# Patient Record
Sex: Male | Born: 1950 | Race: Black or African American | Hispanic: No | Marital: Single | Smoking: Former smoker
Health system: Southern US, Community
[De-identification: ages and names within clinical notes are randomized; demographics above are authoritative.]

## PROBLEM LIST (undated history)

## (undated) DIAGNOSIS — Z87898 Personal history of other specified conditions: Secondary | ICD-10-CM

## (undated) DIAGNOSIS — R570 Cardiogenic shock: Secondary | ICD-10-CM

## (undated) DIAGNOSIS — J90 Pleural effusion, not elsewhere classified: Secondary | ICD-10-CM

## (undated) HISTORY — DX: Personal history of other specified conditions: Z87.898

---
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
## 1998-04-02 ENCOUNTER — Other Ambulatory Visit: Admission: RE | Admit: 1998-04-02 | Discharge: 1998-04-02 | Payer: Self-pay | Admitting: Orthopedic Surgery

## 2003-05-01 ENCOUNTER — Encounter: Admission: RE | Admit: 2003-05-01 | Discharge: 2003-07-30 | Payer: Self-pay

## 2003-08-14 ENCOUNTER — Ambulatory Visit (HOSPITAL_COMMUNITY): Admission: RE | Admit: 2003-08-14 | Discharge: 2003-08-14 | Payer: Self-pay | Admitting: Family Medicine

## 2004-07-27 ENCOUNTER — Encounter (INDEPENDENT_AMBULATORY_CARE_PROVIDER_SITE_OTHER): Payer: Self-pay | Admitting: *Deleted

## 2004-07-27 ENCOUNTER — Ambulatory Visit (HOSPITAL_COMMUNITY): Admission: RE | Admit: 2004-07-27 | Discharge: 2004-07-27 | Payer: Self-pay | Admitting: Gastroenterology

## 2004-10-15 ENCOUNTER — Ambulatory Visit: Payer: Self-pay | Admitting: Internal Medicine

## 2008-03-25 ENCOUNTER — Inpatient Hospital Stay (HOSPITAL_COMMUNITY): Admission: EM | Admit: 2008-03-25 | Discharge: 2008-04-01 | Payer: Self-pay | Admitting: Emergency Medicine

## 2008-04-03 ENCOUNTER — Emergency Department (HOSPITAL_COMMUNITY): Admission: EM | Admit: 2008-04-03 | Discharge: 2008-04-04 | Payer: Self-pay | Admitting: Emergency Medicine

## 2010-02-28 ENCOUNTER — Encounter: Payer: Self-pay | Admitting: Gastroenterology

## 2010-05-25 LAB — GLUCOSE, CAPILLARY
Glucose-Capillary: 102 mg/dL — ABNORMAL HIGH (ref 70–99)
Glucose-Capillary: 114 mg/dL — ABNORMAL HIGH (ref 70–99)
Glucose-Capillary: 115 mg/dL — ABNORMAL HIGH (ref 70–99)
Glucose-Capillary: 128 mg/dL — ABNORMAL HIGH (ref 70–99)
Glucose-Capillary: 134 mg/dL — ABNORMAL HIGH (ref 70–99)
Glucose-Capillary: 139 mg/dL — ABNORMAL HIGH (ref 70–99)
Glucose-Capillary: 144 mg/dL — ABNORMAL HIGH (ref 70–99)
Glucose-Capillary: 148 mg/dL — ABNORMAL HIGH (ref 70–99)
Glucose-Capillary: 164 mg/dL — ABNORMAL HIGH (ref 70–99)
Glucose-Capillary: 172 mg/dL — ABNORMAL HIGH (ref 70–99)
Glucose-Capillary: 174 mg/dL — ABNORMAL HIGH (ref 70–99)
Glucose-Capillary: 190 mg/dL — ABNORMAL HIGH (ref 70–99)
Glucose-Capillary: 255 mg/dL — ABNORMAL HIGH (ref 70–99)
Glucose-Capillary: 257 mg/dL — ABNORMAL HIGH (ref 70–99)
Glucose-Capillary: 264 mg/dL — ABNORMAL HIGH (ref 70–99)
Glucose-Capillary: 266 mg/dL — ABNORMAL HIGH (ref 70–99)
Glucose-Capillary: 283 mg/dL — ABNORMAL HIGH (ref 70–99)
Glucose-Capillary: 75 mg/dL (ref 70–99)
Glucose-Capillary: 81 mg/dL (ref 70–99)

## 2010-05-25 LAB — COMPREHENSIVE METABOLIC PANEL
ALT: 102 U/L — ABNORMAL HIGH (ref 0–53)
Albumin: 2.3 g/dL — ABNORMAL LOW (ref 3.5–5.2)
Albumin: 2.6 g/dL — ABNORMAL LOW (ref 3.5–5.2)
Albumin: 3.1 g/dL — ABNORMAL LOW (ref 3.5–5.2)
Alkaline Phosphatase: 55 U/L (ref 39–117)
Alkaline Phosphatase: 60 U/L (ref 39–117)
Alkaline Phosphatase: 65 U/L (ref 39–117)
Alkaline Phosphatase: 93 U/L (ref 39–117)
BUN: 5 mg/dL — ABNORMAL LOW (ref 6–23)
BUN: 7 mg/dL (ref 6–23)
BUN: 9 mg/dL (ref 6–23)
CO2: 22 mEq/L (ref 19–32)
Calcium: 8.5 mg/dL (ref 8.4–10.5)
Calcium: 8.9 mg/dL (ref 8.4–10.5)
Creatinine, Ser: 1.11 mg/dL (ref 0.4–1.5)
Creatinine, Ser: 1.11 mg/dL (ref 0.4–1.5)
GFR calc non Af Amer: >60 mL/min (ref >60)
GFR calc non Af Amer: >60 mL/min (ref >60)
Glucose, Bld: 161 mg/dL — ABNORMAL HIGH (ref 70–99)
Glucose, Bld: 162 mg/dL — ABNORMAL HIGH (ref 70–99)
Glucose, Bld: 172 mg/dL — ABNORMAL HIGH (ref 70–99)
Potassium: 3.5 mEq/L (ref 3.5–5.1)
Potassium: 3.6 mEq/L (ref 3.5–5.1)
Potassium: 3.7 mEq/L (ref 3.5–5.1)
Sodium: 129 mEq/L — ABNORMAL LOW (ref 135–145)
Total Bilirubin: 6.6 mg/dL — ABNORMAL HIGH (ref 0.3–1.2)
Total Bilirubin: 8.3 mg/dL — ABNORMAL HIGH (ref 0.3–1.2)
Total Protein: 5.4 g/dL — ABNORMAL LOW (ref 6.0–8.3)
Total Protein: 5.7 g/dL — ABNORMAL LOW (ref 6.0–8.3)

## 2010-05-25 LAB — CBC
HCT: 31 % — ABNORMAL LOW (ref 39.0–52.0)
HCT: 31.2 % — ABNORMAL LOW (ref 39.0–52.0)
HCT: 32.5 % — ABNORMAL LOW (ref 39.0–52.0)
HCT: 34.9 % — ABNORMAL LOW (ref 39.0–52.0)
HCT: 35.5 % — ABNORMAL LOW (ref 39.0–52.0)
Hemoglobin: 10.7 g/dL — ABNORMAL LOW (ref 13.0–17.0)
Hemoglobin: 11 g/dL — ABNORMAL LOW (ref 13.0–17.0)
Hemoglobin: 11.1 g/dL — ABNORMAL LOW (ref 13.0–17.0)
Hemoglobin: 12.4 g/dL — ABNORMAL LOW (ref 13.0–17.0)
Hemoglobin: 12.9 g/dL — ABNORMAL LOW (ref 13.0–17.0)
MCHC: 34.7 g/dL (ref 30.0–36.0)
MCHC: 35.5 g/dL (ref 30.0–36.0)
MCHC: 35.5 g/dL (ref 30.0–36.0)
MCHC: 35.6 g/dL (ref 30.0–36.0)
MCV: 94.5 fL (ref 78.0–100.0)
MCV: 96.2 fL (ref 78.0–100.0)
MCV: 96.8 fL (ref 78.0–100.0)
Platelets: 102 10*3/uL — ABNORMAL LOW (ref 150–400)
Platelets: 107 10*3/uL — ABNORMAL LOW (ref 150–400)
Platelets: 127 10*3/uL — ABNORMAL LOW (ref 150–400)
RBC: 3.32 MIL/uL — ABNORMAL LOW (ref 4.22–5.81)
RBC: 3.76 MIL/uL — ABNORMAL LOW (ref 4.22–5.81)
RDW: 13.3 % (ref 11.5–15.5)
RDW: 13.4 % (ref 11.5–15.5)
RDW: 13.6 % (ref 11.5–15.5)
RDW: 13.7 % (ref 11.5–15.5)
WBC: 4.6 10*3/uL (ref 4.0–10.5)

## 2010-05-25 LAB — ETHANOL: Alcohol, Ethyl (B): <5 mg/dL (ref 0–10)

## 2010-05-25 LAB — IRON AND TIBC
Iron: 227 ug/dL — ABNORMAL HIGH (ref 42–135)
UIBC: <55 ug/dL

## 2010-05-25 LAB — RETICULOCYTES
RBC.: 3.72 MIL/uL — ABNORMAL LOW (ref 4.22–5.81)
Retic Count, Absolute: 63.2 10*3/uL (ref 19.0–186.0)

## 2010-05-25 LAB — URINALYSIS, ROUTINE W REFLEX MICROSCOPIC
Glucose, UA: NEGATIVE mg/dL
Hgb urine dipstick: NEGATIVE
Ketones, ur: 40 mg/dL — AB
Protein, ur: NEGATIVE mg/dL
Protein, ur: NEGATIVE mg/dL
Urobilinogen, UA: 1 mg/dL (ref 0.0–1.0)
Urobilinogen, UA: 2 mg/dL — ABNORMAL HIGH (ref 0.0–1.0)

## 2010-05-25 LAB — RAPID URINE DRUG SCREEN, HOSP PERFORMED
Amphetamines: NOT DETECTED
Barbiturates: NOT DETECTED
Cocaine: NOT DETECTED
Opiates: NOT DETECTED
Tetrahydrocannabinol: NOT DETECTED

## 2010-05-25 LAB — LIPASE, BLOOD: Lipase: 83 U/L — ABNORMAL HIGH (ref 11–59)

## 2010-05-25 LAB — DIFFERENTIAL
Basophils Absolute: 0.1 10*3/uL (ref 0.0–0.1)
Basophils Relative: 0 % (ref 0–1)
Eosinophils Absolute: 0 10*3/uL (ref 0.0–0.7)
Lymphocytes Relative: 17 % (ref 12–46)
Lymphs Abs: 0.8 10*3/uL (ref 0.7–4.0)
Monocytes Relative: 8 % (ref 3–12)
Neutro Abs: 3.5 10*3/uL (ref 1.7–7.7)
Neutrophils Relative %: 74 % (ref 43–77)
Neutrophils Relative %: 77 % (ref 43–77)

## 2010-05-25 LAB — BASIC METABOLIC PANEL
Calcium: 8.5 mg/dL (ref 8.4–10.5)
Calcium: 9 mg/dL (ref 8.4–10.5)
Chloride: 73 mEq/L — CL (ref 96–112)
GFR calc Af Amer: >60 mL/min (ref >60)
GFR calc Af Amer: >60 mL/min (ref >60)
GFR calc non Af Amer: >60 mL/min (ref >60)
Glucose, Bld: 259 mg/dL — ABNORMAL HIGH (ref 70–99)
Potassium: 3.7 mEq/L (ref 3.5–5.1)
Potassium: 4.4 mEq/L (ref 3.5–5.1)
Sodium: 107 mEq/L — CL (ref 135–145)
Sodium: 136 mEq/L (ref 135–145)

## 2010-05-25 LAB — PHOSPHORUS
Phosphorus: 1.6 mg/dL — ABNORMAL LOW (ref 2.3–4.6)
Phosphorus: <1 mg/dL — CL (ref 2.3–4.6)

## 2010-05-25 LAB — HEPATIC FUNCTION PANEL
Albumin: 3.2 g/dL — ABNORMAL LOW (ref 3.5–5.2)
Bilirubin, Direct: 4.2 mg/dL — ABNORMAL HIGH (ref 0.0–0.3)
Total Protein: 7.5 g/dL (ref 6.0–8.3)

## 2010-05-25 LAB — PROTIME-INR
INR: 1.2 (ref 0.00–1.49)
Prothrombin Time: 15.2 seconds (ref 11.6–15.2)

## 2010-05-25 LAB — HEMOCCULT GUIAC POC 1CARD (OFFICE): Fecal Occult Bld: NEGATIVE

## 2010-05-25 LAB — AMMONIA: Ammonia: 24 umol/L (ref 11–35)

## 2010-05-25 LAB — FERRITIN: Ferritin: 3838 ng/mL — ABNORMAL HIGH (ref 22–322)

## 2010-05-25 LAB — TRANSFERRIN: Transferrin: 131 mg/dL — ABNORMAL LOW (ref 212–360)

## 2010-05-25 LAB — URINE MICROSCOPIC-ADD ON

## 2010-05-25 LAB — VITAMIN B12: Vitamin B-12: 1743 pg/mL — ABNORMAL HIGH (ref 211–911)

## 2010-05-25 LAB — MAGNESIUM: Magnesium: 1.9 mg/dL (ref 1.5–2.5)

## 2010-06-22 NOTE — Discharge Summary (Signed)
NAME:  Ryan [name], SEELBACH NO.:  [id number]   MEDICAL RECORD NO.:  [id number]          PATIENT TYPE:  INP   LOCATION:  [place]                         FACILITY:  [facility]   PHYSICIAN:  Barbette Merino, M.D.      DATE OF BIRTH:  06-03-1950   DATE OF ADMISSION:  03/25/2008  DATE OF DISCHARGE:  03/30/2008                               DISCHARGE SUMMARY   PRIMARY CARE PHYSICIAN:  Petronila:  1. Alcohol intoxication with withdrawal.  2. Hypertension.  3. Diabetes.  4. Alcoholic liver disease.  5. Transient pancytopenia.  6. Increased ferritin and iron.  7. Hyponatremia, now resolved.   DISCHARGE MEDICATIONS:  1. Thiamine 100 mg daily.  2. Folic acid 1 mg daily.  3. Ativan 1 mg p.o. q.4 hours p.r.n. anxiety/agitation.  4. Glipizide 10 mg daily.  5. Cozaar 100 mg daily.  6. Hydrochlorothiazide 25 mg daily.  7. Lopressor 25 mg p.o. b.i.d.   DISPOSITION:  The patient is being discharged to continue his follow up  at the [facility].  He is currently stable.  He is going to be staying  with family members.   PROCEDURES PERFORMED:  None.   CONSULTATIONS:  None.   BRIEF HISTORY AND PHYSICAL:  Please refer to dictated history and  physical on admission by Dr. Karlyn Agee.  In short, however, the  patient is a 60 year old [place] veteran with a history of diabetes and  hypertension as well as alcoholism.  The patient came in complaining of  alcohol binging.  He stopped taking alcohol about 2 days prior to coming  in, asking for detoxification.  The patient was agitated at the time but  not distressed.  His labs showed a sodium of 107, potassium 4.4, and his  glucose of 259.  His creatinine was 1.32.  He was also alkalotic.  He  was subsequently admitted for further management.   HOSPITAL COURSE:  1. Alcohol withdrawal.  The patient was placed on the CIWA protocol      with Ativan.  He continued to respond gradually up until today.  He      is  about to finish his protocol in the next 2 days and seems to be      stable at this point, able to walk around.  2. Hyponatremia.  This was severe as indicated.  But with saline and      regular conservative measures, his sodium has since corrected.  3. Hypertension.  This is also controlled on the medications including      his home meds and hydralazine as needed.  4. Diabetes.  The patient was placed on Lantus and sliding scale      insulin while in the hospital, but we put him back on his glipizide      but at a higher dose.  5. Pancytopenia.  This most likely secondary to liver disease.  This      is also improving.  6. Alcoholic hepatitis with cirrhosis.  Again this is all alcohol      related.  The patient is counseled extensively.  He  will continue      with outpatient counseling as necessary.      Barbette Merino, M.D.  Electronically Signed     LG/MEDQ  D:  03/30/2008  T:  03/30/2008  Job:  [id number]

## 2010-06-22 NOTE — H&P (Signed)
NAME:  Ryan [name], ORSER NO.:  [id number]   MEDICAL RECORD NO.:  [id number]          PATIENT TYPE:  EMS   LOCATION:  ED                           FACILITY:  [facility]   PHYSICIAN:  Karlyn Agee, M.D. DATE OF BIRTH:  April 17, 1950   DATE OF ADMISSION:  03/25/2008  DATE OF DISCHARGE:                              HISTORY & PHYSICAL   PRIMARY CARE PHYSICIAN:  [facility].   CHIEF COMPLAINT:  Alcohol binging.   HISTORY OF PRESENT ILLNESS:  This is a 60 year old [place] Veteran with  history of diabetes, hypertension and alcoholism; who usually drinks  about one pint of gin every day, and has been drinking consistently  until about 2 days ago when he decided to stop.  He Presented to the  emergency room today requesting to be admitted today as a health alcohol  detox.  The patient was evaluated by the emergency room physician and  was noted to have abnormal serum by chemistry, and the hospitalist  service called to assist with management.   The patient vomiting twice per day for the past 3 days.  Denies any  diarrhea.  Denies any abdominal pain, but has been having sore throats  since the vomiting.  Also has been having hiccups, but denies chest  pain.  Denies abdominal pain.  Denies fever, cough or cold.  He denies  headache or shortness of breath.   The patient reports that he can go without drinking alcohol for extended  period without having any symptoms of withdrawal.   PAST MEDICAL HISTORY:  1. Diabetes.  2. Hypertension.  3. History of chronic active hepatitis by biopsy in 2006.   MEDICATIONS:  1. Cozaar 100 mg daily.  2. Glipizide 2.5 mg twice daily.  3. Metoprolol tartrate 25 mg daily.  4. Hydrochlorothiazide 25 mg daily.  5. Metformin 850 mg twice daily.   ALLERGIES:  ACE INHIBITOR causes cough.   SOCIAL HISTORY:  Denies tobacco or illicit drug use, but drinks one pint  of gin every day.  He is a veteran of the [place] War.   FAMILY HISTORY:   Denies any chronic medical problems in the family.   REVIEW OF SYSTEMS:  Other than noted above, the patient denies any  problems on a 10-point review of systems.   PHYSICAL EXAMINATION:  A small-framed middle-aged African American  gentleman lying on the stretcher; does not appear in any way distressed.  VITAL SIGNS: Temperature 97.7, pulse 93, respirations 20, blood pressure  160/107, saturating at 99% on room air.  His pupils are round and equal.  Mucous membranes pink.  Anicteric.  He  is mildly dehydrated.  No cervical lymphadenopathy or thyromegaly.  No jugular venous  distention or bruit.  CHEST:  Clear to auscultation bilaterally.  CARDIOVASCULAR SYSTEM:  Regular rhythm.  He has a 1/6 systolic murmur in  the left upper sternal border.  ABDOMEN:  Slightly distended, but soft and nontender.  There are no  masses and there is no flank dullness.  EXTREMITIES:  Without edema.  He has 2+ pulses bilaterally.  CENTRAL NERVOUS SYSTEM: Cranial nerves II-XII are grossly  intact and he  has no focal neurologic deficits.   LABS:  CBC:  White count 4.6, hemoglobin 12.7, MCV 94.5, platelets 129.  He has a normal differential on his white count.  Serum Chemistries:  Sodium 107, potassium 4.4, chloride 73, CO2 17, glucose 259, BUN 10,  creatinine 1.32, calcium 9.0.  Alcohol level was undetectable.  His  urinalysis showed 500 glucose, specific gravity 1.016, protein negative,  ketones 14.   ASSESSMENT:  1. Hyponatremic and hypochloremic alkalosis associated with vomiting.  2. Chronic alcoholicism.  3. Hypertension uncontrolled.  4. Diabetes type 2 uncontrolled.  5. Sore throat associated with persistent vomiting.   PLAN:  Will admit this gentleman for IV fluid hydration with normal  saline.  Despite his claims of not going into alcohol withdrawal, we  will put him prophylactically on benzodiazepines.  We will hold his  Cozaar for the time being and control his blood pressure with beta   blockers until he is more stable.  We will withhold diuretics for the  time being.  Other plans as per orders.      Karlyn Agee, M.D.  Electronically Signed     LC/MEDQ  D:  03/25/2008  T:  03/25/2008  Job:  [id number]   cc:   [facility]

## 2010-06-22 NOTE — Discharge Summary (Signed)
NAME:  [name], Ryan [name] NO.:  [id number]   MEDICAL RECORD NO.:  [id number]          PATIENT TYPE:  INP   LOCATION:  [id number]                         FACILITY:  [facility]   PHYSICIAN:  Dena Billet, MD     DATE OF BIRTH:  12-18-1950   DATE OF ADMISSION:  03/25/2008  DATE OF DISCHARGE:  04/01/2008                               DISCHARGE SUMMARY   PRIMARY CARE PHYSICIAN:  At [facility].   DISCHARGE DIAGNOSES:  1. Alcohol intoxication.  2. Alcohol withdrawal.  3. Diabetes.  4. Hypertension.  5. Chronic active hepatitis.  6. Anemia.   HOSPITAL COURSE:  This is a 60 year old African American male patient  with a past medical history significant for chronic active hepatitis,  diabetes and hypertension, who was admitted on March 25, 2008 with a  chief complaint of alcohol binging.  The patient was admitted with a  diagnosis of hyponatremia as well as alkalosis.  The patient was found  to be dehydrated and vomiting.  He was started on IV fluids with normal  saline.  The patient was placed on p.r.n. Ativan and also on Cozaar, and  beta blocker for his blood pressure.  The patient's condition  dramatically improved over a period of a week.  On the day of discharge,  the patient did not have any acute events, was walking without any  tremors, shakes or any jitteriness.  Did not have any signs or symptoms  consistent with alcohol withdrawal at that point.   DISCHARGE MEDICATIONS:  Please see the detailed reconciled list of  medications given to the patient at the time of discharge.  1. Cozaar 100 mg daily.  2. Glipizide 10 mg p.o. daily.  3. Hydrochlorothiazide 25 mg p.o. daily.  4. Lopressor 25 mg p.o. b.i.d.  5. Thiamine 100 mg p.o. daily.  6. Folic acid 1 mg p.o. daily.  7. Ativan 1 mg p.o. q.4 h. p.r.n.  8. Protonix 40 mg p.o. daily.      Dena Billet, MD  Electronically Signed    NS/MEDQ  D:  04/01/2008  T:  04/01/2008  Job:  [id number]   cc:   [facility]

## 2018-07-13 ENCOUNTER — Emergency Department (HOSPITAL_COMMUNITY): Payer: Medicare Other

## 2018-07-13 ENCOUNTER — Inpatient Hospital Stay (HOSPITAL_COMMUNITY)
Admission: EM | Admit: 2018-07-13 | Discharge: 2018-08-01 | DRG: 682 | Disposition: A | Discharge status: Home Health | Payer: Medicare Other | Attending: Student in an Organized Health Care Education/Training Program | Admitting: Student in an Organized Health Care Education/Training Program

## 2018-07-13 ENCOUNTER — Encounter (HOSPITAL_COMMUNITY): Payer: Self-pay | Admitting: Emergency Medicine

## 2018-07-13 ENCOUNTER — Other Ambulatory Visit: Payer: Self-pay

## 2018-07-13 DIAGNOSIS — I5082 Biventricular heart failure: Secondary | ICD-10-CM | POA: Diagnosis not present

## 2018-07-13 DIAGNOSIS — D6959 Other secondary thrombocytopenia: Secondary | ICD-10-CM | POA: Diagnosis present

## 2018-07-13 DIAGNOSIS — Z9889 Other specified postprocedural states: Secondary | ICD-10-CM

## 2018-07-13 DIAGNOSIS — N189 Chronic kidney disease, unspecified: Secondary | ICD-10-CM | POA: Diagnosis present

## 2018-07-13 DIAGNOSIS — X58XXXA Exposure to other specified factors, initial encounter: Secondary | ICD-10-CM | POA: Diagnosis not present

## 2018-07-13 DIAGNOSIS — K8020 Calculus of gallbladder without cholecystitis without obstruction: Secondary | ICD-10-CM | POA: Diagnosis not present

## 2018-07-13 DIAGNOSIS — I82461 Acute embolism and thrombosis of right calf muscular vein: Secondary | ICD-10-CM | POA: Diagnosis present

## 2018-07-13 DIAGNOSIS — F1410 Cocaine abuse, uncomplicated: Secondary | ICD-10-CM | POA: Diagnosis present

## 2018-07-13 DIAGNOSIS — I5021 Acute systolic (congestive) heart failure: Secondary | ICD-10-CM | POA: Diagnosis present

## 2018-07-13 DIAGNOSIS — K8021 Calculus of gallbladder without cholecystitis with obstruction: Secondary | ICD-10-CM | POA: Diagnosis present

## 2018-07-13 DIAGNOSIS — R079 Chest pain, unspecified: Secondary | ICD-10-CM | POA: Diagnosis not present

## 2018-07-13 DIAGNOSIS — Z87891 Personal history of nicotine dependence: Secondary | ICD-10-CM

## 2018-07-13 DIAGNOSIS — Z87898 Personal history of other specified conditions: Secondary | ICD-10-CM | POA: Insufficient documentation

## 2018-07-13 DIAGNOSIS — I82441 Acute embolism and thrombosis of right tibial vein: Secondary | ICD-10-CM | POA: Diagnosis present

## 2018-07-13 DIAGNOSIS — K30 Functional dyspepsia: Secondary | ICD-10-CM | POA: Diagnosis not present

## 2018-07-13 DIAGNOSIS — F1020 Alcohol dependence, uncomplicated: Secondary | ICD-10-CM | POA: Diagnosis present

## 2018-07-13 DIAGNOSIS — E860 Dehydration: Secondary | ICD-10-CM | POA: Diagnosis present

## 2018-07-13 DIAGNOSIS — R634 Abnormal weight loss: Secondary | ICD-10-CM | POA: Diagnosis not present

## 2018-07-13 DIAGNOSIS — M109 Gout, unspecified: Secondary | ICD-10-CM | POA: Diagnosis present

## 2018-07-13 DIAGNOSIS — K7030 Alcoholic cirrhosis of liver without ascites: Secondary | ICD-10-CM | POA: Diagnosis present

## 2018-07-13 DIAGNOSIS — R011 Cardiac murmur, unspecified: Secondary | ICD-10-CM | POA: Diagnosis not present

## 2018-07-13 DIAGNOSIS — I361 Nonrheumatic tricuspid (valve) insufficiency: Secondary | ICD-10-CM | POA: Diagnosis not present

## 2018-07-13 DIAGNOSIS — I82451 Acute embolism and thrombosis of right peroneal vein: Secondary | ICD-10-CM | POA: Diagnosis present

## 2018-07-13 DIAGNOSIS — R791 Abnormal coagulation profile: Secondary | ICD-10-CM | POA: Diagnosis not present

## 2018-07-13 DIAGNOSIS — E875 Hyperkalemia: Secondary | ICD-10-CM | POA: Diagnosis not present

## 2018-07-13 DIAGNOSIS — F329 Major depressive disorder, single episode, unspecified: Secondary | ICD-10-CM | POA: Diagnosis present

## 2018-07-13 DIAGNOSIS — R160 Hepatomegaly, not elsewhere classified: Secondary | ICD-10-CM | POA: Diagnosis present

## 2018-07-13 DIAGNOSIS — Z66 Do not resuscitate: Secondary | ICD-10-CM | POA: Diagnosis not present

## 2018-07-13 DIAGNOSIS — I081 Rheumatic disorders of both mitral and tricuspid valves: Secondary | ICD-10-CM | POA: Diagnosis not present

## 2018-07-13 DIAGNOSIS — N184 Chronic kidney disease, stage 4 (severe): Secondary | ICD-10-CM | POA: Diagnosis present

## 2018-07-13 DIAGNOSIS — F419 Anxiety disorder, unspecified: Secondary | ICD-10-CM | POA: Diagnosis present

## 2018-07-13 DIAGNOSIS — I82411 Acute embolism and thrombosis of right femoral vein: Secondary | ICD-10-CM | POA: Diagnosis present

## 2018-07-13 DIAGNOSIS — R64 Cachexia: Secondary | ICD-10-CM | POA: Diagnosis present

## 2018-07-13 DIAGNOSIS — F1010 Alcohol abuse, uncomplicated: Secondary | ICD-10-CM | POA: Diagnosis not present

## 2018-07-13 DIAGNOSIS — N179 Acute kidney failure, unspecified: Principal | ICD-10-CM | POA: Diagnosis present

## 2018-07-13 DIAGNOSIS — J811 Chronic pulmonary edema: Secondary | ICD-10-CM | POA: Diagnosis not present

## 2018-07-13 DIAGNOSIS — I82401 Acute embolism and thrombosis of unspecified deep veins of right lower extremity: Secondary | ICD-10-CM | POA: Diagnosis not present

## 2018-07-13 DIAGNOSIS — I5041 Acute combined systolic (congestive) and diastolic (congestive) heart failure: Secondary | ICD-10-CM | POA: Diagnosis not present

## 2018-07-13 DIAGNOSIS — I82431 Acute embolism and thrombosis of right popliteal vein: Secondary | ICD-10-CM | POA: Diagnosis present

## 2018-07-13 DIAGNOSIS — D1809 Hemangioma of other sites: Secondary | ICD-10-CM | POA: Diagnosis present

## 2018-07-13 DIAGNOSIS — J90 Pleural effusion, not elsewhere classified: Secondary | ICD-10-CM | POA: Diagnosis present

## 2018-07-13 DIAGNOSIS — Z1159 Encounter for screening for other viral diseases: Secondary | ICD-10-CM | POA: Diagnosis not present

## 2018-07-13 DIAGNOSIS — E119 Type 2 diabetes mellitus without complications: Secondary | ICD-10-CM | POA: Diagnosis not present

## 2018-07-13 DIAGNOSIS — R1310 Dysphagia, unspecified: Secondary | ICD-10-CM | POA: Diagnosis not present

## 2018-07-13 DIAGNOSIS — K7031 Alcoholic cirrhosis of liver with ascites: Secondary | ICD-10-CM | POA: Diagnosis present

## 2018-07-13 DIAGNOSIS — I130 Hypertensive heart and chronic kidney disease with heart failure and stage 1 through stage 4 chronic kidney disease, or unspecified chronic kidney disease: Secondary | ICD-10-CM | POA: Diagnosis present

## 2018-07-13 DIAGNOSIS — I959 Hypotension, unspecified: Secondary | ICD-10-CM | POA: Diagnosis not present

## 2018-07-13 DIAGNOSIS — F331 Major depressive disorder, recurrent, moderate: Secondary | ICD-10-CM | POA: Diagnosis not present

## 2018-07-13 DIAGNOSIS — J918 Pleural effusion in other conditions classified elsewhere: Secondary | ICD-10-CM | POA: Diagnosis present

## 2018-07-13 DIAGNOSIS — E872 Acidosis: Secondary | ICD-10-CM | POA: Diagnosis present

## 2018-07-13 DIAGNOSIS — Z7289 Other problems related to lifestyle: Secondary | ICD-10-CM | POA: Diagnosis not present

## 2018-07-13 DIAGNOSIS — R570 Cardiogenic shock: Secondary | ICD-10-CM | POA: Diagnosis present

## 2018-07-13 DIAGNOSIS — R0902 Hypoxemia: Secondary | ICD-10-CM | POA: Diagnosis not present

## 2018-07-13 DIAGNOSIS — I340 Nonrheumatic mitral (valve) insufficiency: Secondary | ICD-10-CM | POA: Diagnosis not present

## 2018-07-13 DIAGNOSIS — I240 Acute coronary thrombosis not resulting in myocardial infarction: Secondary | ICD-10-CM | POA: Diagnosis present

## 2018-07-13 DIAGNOSIS — N183 Chronic kidney disease, stage 3 (moderate): Secondary | ICD-10-CM | POA: Diagnosis present

## 2018-07-13 DIAGNOSIS — Z79899 Other long term (current) drug therapy: Secondary | ICD-10-CM

## 2018-07-13 DIAGNOSIS — E861 Hypovolemia: Secondary | ICD-10-CM | POA: Diagnosis present

## 2018-07-13 DIAGNOSIS — Z6823 Body mass index (BMI) 23.0-23.9, adult: Secondary | ICD-10-CM

## 2018-07-13 DIAGNOSIS — I2699 Other pulmonary embolism without acute cor pulmonale: Secondary | ICD-10-CM | POA: Diagnosis not present

## 2018-07-13 DIAGNOSIS — N281 Cyst of kidney, acquired: Secondary | ICD-10-CM | POA: Diagnosis present

## 2018-07-13 DIAGNOSIS — N19 Unspecified kidney failure: Secondary | ICD-10-CM | POA: Diagnosis not present

## 2018-07-13 DIAGNOSIS — E46 Unspecified protein-calorie malnutrition: Secondary | ICD-10-CM | POA: Diagnosis not present

## 2018-07-13 DIAGNOSIS — E43 Unspecified severe protein-calorie malnutrition: Secondary | ICD-10-CM | POA: Diagnosis present

## 2018-07-13 DIAGNOSIS — Z7984 Long term (current) use of oral hypoglycemic drugs: Secondary | ICD-10-CM | POA: Diagnosis not present

## 2018-07-13 DIAGNOSIS — I513 Intracardiac thrombosis, not elsewhere classified: Secondary | ICD-10-CM | POA: Diagnosis present

## 2018-07-13 DIAGNOSIS — E1122 Type 2 diabetes mellitus with diabetic chronic kidney disease: Secondary | ICD-10-CM | POA: Diagnosis present

## 2018-07-13 DIAGNOSIS — Z8619 Personal history of other infectious and parasitic diseases: Secondary | ICD-10-CM | POA: Diagnosis not present

## 2018-07-13 DIAGNOSIS — I129 Hypertensive chronic kidney disease with stage 1 through stage 4 chronic kidney disease, or unspecified chronic kidney disease: Secondary | ICD-10-CM | POA: Diagnosis not present

## 2018-07-13 DIAGNOSIS — R740 Nonspecific elevation of levels of transaminase and lactic acid dehydrogenase [LDH]: Secondary | ICD-10-CM | POA: Diagnosis not present

## 2018-07-13 DIAGNOSIS — S29011A Strain of muscle and tendon of front wall of thorax, initial encounter: Secondary | ICD-10-CM | POA: Diagnosis not present

## 2018-07-13 DIAGNOSIS — F1490 Cocaine use, unspecified, uncomplicated: Secondary | ICD-10-CM | POA: Diagnosis not present

## 2018-07-13 DIAGNOSIS — K5900 Constipation, unspecified: Secondary | ICD-10-CM | POA: Diagnosis not present

## 2018-07-13 DIAGNOSIS — Z888 Allergy status to other drugs, medicaments and biological substances status: Secondary | ICD-10-CM

## 2018-07-13 DIAGNOSIS — R0600 Dyspnea, unspecified: Secondary | ICD-10-CM | POA: Diagnosis not present

## 2018-07-13 LAB — CBC WITH DIFFERENTIAL/PLATELET
Abs Immature Granulocytes: 0.01 10*3/uL (ref 0.00–0.07)
Basophils Absolute: 0.1 10*3/uL (ref 0.0–0.1)
Basophils Relative: 1 %
Eosinophils Absolute: 0 10*3/uL (ref 0.0–0.5)
Eosinophils Relative: 1 %
HCT: 52.2 % — ABNORMAL HIGH (ref 39.0–52.0)
Hemoglobin: 16.8 g/dL (ref 13.0–17.0)
Immature Granulocytes: 0 %
Lymphocytes Relative: 26 %
Lymphs Abs: 1.6 10*3/uL (ref 0.7–4.0)
MCH: 30.2 pg (ref 26.0–34.0)
MCHC: 32.2 g/dL (ref 30.0–36.0)
MCV: 93.7 fL (ref 80.0–100.0)
Monocytes Absolute: 0.6 10*3/uL (ref 0.1–1.0)
Monocytes Relative: 10 %
Neutro Abs: 3.8 10*3/uL (ref 1.7–7.7)
Neutrophils Relative %: 62 %
Platelets: 129 10*3/uL — ABNORMAL LOW (ref 150–400)
RBC: 5.57 MIL/uL (ref 4.22–5.81)
RDW: 13.5 % (ref 11.5–15.5)
WBC: 6.2 10*3/uL (ref 4.0–10.5)
nRBC: 0 % (ref 0.0–0.2)

## 2018-07-13 LAB — COMPREHENSIVE METABOLIC PANEL
ALT: 19 U/L (ref 0–44)
AST: 31 U/L (ref 15–41)
Albumin: 3 g/dL — ABNORMAL LOW (ref 3.5–5.0)
Alkaline Phosphatase: 232 U/L — ABNORMAL HIGH (ref 38–126)
Anion gap: 16 — ABNORMAL HIGH (ref 5–15)
BUN: 54 mg/dL — ABNORMAL HIGH (ref 8–23)
CO2: 15 mmol/L — ABNORMAL LOW (ref 22–32)
Calcium: 9.7 mg/dL (ref 8.9–10.3)
Chloride: 108 mmol/L (ref 98–111)
Creatinine, Ser: 2.36 mg/dL — ABNORMAL HIGH (ref 0.61–1.24)
GFR calc Af Amer: 31 mL/min — ABNORMAL LOW (ref >60)
GFR calc non Af Amer: 27 mL/min — ABNORMAL LOW (ref >60)
Glucose, Bld: 198 mg/dL — ABNORMAL HIGH (ref 70–99)
Potassium: 5.9 mmol/L — ABNORMAL HIGH (ref 3.5–5.1)
Sodium: 139 mmol/L (ref 135–145)
Total Bilirubin: 1.5 mg/dL — ABNORMAL HIGH (ref 0.3–1.2)
Total Protein: 7.5 g/dL (ref 6.5–8.1)

## 2018-07-13 LAB — BASIC METABOLIC PANEL
Anion gap: 12 (ref 5–15)
BUN: 53 mg/dL — ABNORMAL HIGH (ref 8–23)
CO2: 18 mmol/L — ABNORMAL LOW (ref 22–32)
Calcium: 9.4 mg/dL (ref 8.9–10.3)
Chloride: 109 mmol/L (ref 98–111)
Creatinine, Ser: 2.3 mg/dL — ABNORMAL HIGH (ref 0.61–1.24)
GFR calc Af Amer: 32 mL/min — ABNORMAL LOW (ref >60)
GFR calc non Af Amer: 28 mL/min — ABNORMAL LOW (ref >60)
Glucose, Bld: 175 mg/dL — ABNORMAL HIGH (ref 70–99)
Potassium: 4.8 mmol/L (ref 3.5–5.1)
Sodium: 139 mmol/L (ref 135–145)

## 2018-07-13 LAB — URINALYSIS, ROUTINE W REFLEX MICROSCOPIC
Bilirubin Urine: NEGATIVE
Glucose, UA: 50 mg/dL — AB
Ketones, ur: 5 mg/dL — AB
Leukocytes,Ua: NEGATIVE
Nitrite: NEGATIVE
Protein, ur: >=300 mg/dL — AB
Specific Gravity, Urine: 1.02 (ref 1.005–1.030)
pH: 5 (ref 5.0–8.0)

## 2018-07-13 LAB — GLUCOSE, CAPILLARY: Glucose-Capillary: 192 mg/dL — ABNORMAL HIGH (ref 70–99)

## 2018-07-13 LAB — LIPASE, BLOOD: Lipase: 66 U/L — ABNORMAL HIGH (ref 11–51)

## 2018-07-13 LAB — PROTIME-INR
INR: 1.6 — ABNORMAL HIGH (ref 0.8–1.2)
Prothrombin Time: 18.9 seconds — ABNORMAL HIGH (ref 11.4–15.2)

## 2018-07-13 LAB — SARS CORONAVIRUS 2 BY RT PCR (HOSPITAL ORDER, PERFORMED IN ~~LOC~~ HOSPITAL LAB): SARS Coronavirus 2: NEGATIVE

## 2018-07-13 MED ORDER — LORAZEPAM 2 MG/ML IJ SOLN: 1.0000 mg | Freq: Four times a day (QID) | INTRAMUSCULAR | Status: AC | PRN

## 2018-07-13 MED ORDER — INSULIN ASPART 100 UNIT/ML ~~LOC~~ SOLN
0.0000 [IU] | Freq: Every day | SUBCUTANEOUS | Status: DC
  Administered 2018-07-22: 2 [IU] via SUBCUTANEOUS

## 2018-07-13 MED ORDER — ACETAMINOPHEN 325 MG PO TABS
650.0000 mg | ORAL_TABLET | Freq: Four times a day (QID) | ORAL | Status: DC | PRN
  Administered 2018-07-16 – 2018-07-30 (×5): 650 mg via ORAL
  Filled 2018-07-13 (×7): qty 2

## 2018-07-13 MED ORDER — ONDANSETRON HCL 4 MG PO TABS: 4.0000 mg | ORAL_TABLET | Freq: Four times a day (QID) | ORAL | Status: DC | PRN

## 2018-07-13 MED ORDER — SODIUM CHLORIDE 0.9 % IV SOLN
INTRAVENOUS | Status: AC
  Administered 2018-07-14: 01:00:00 via INTRAVENOUS

## 2018-07-13 MED ORDER — SODIUM CHLORIDE 0.9 % IV SOLN
INTRAVENOUS | Status: DC
  Administered 2018-07-13: 16:00:00 via INTRAVENOUS

## 2018-07-13 MED ORDER — ONDANSETRON HCL 4 MG/2ML IJ SOLN
4.0000 mg | Freq: Once | INTRAMUSCULAR | Status: AC
  Administered 2018-07-13: 4 mg via INTRAVENOUS
  Filled 2018-07-13: qty 2

## 2018-07-13 MED ORDER — THIAMINE HCL 100 MG/ML IJ SOLN
100.0000 mg | Freq: Every day | INTRAMUSCULAR | Status: DC
  Administered 2018-07-13 – 2018-07-14 (×2): 100 mg via INTRAVENOUS
  Filled 2018-07-13 (×2): qty 2

## 2018-07-13 MED ORDER — LORAZEPAM 1 MG PO TABS
1.0000 mg | ORAL_TABLET | Freq: Four times a day (QID) | ORAL | Status: AC | PRN
  Administered 2018-07-14: 1 mg via ORAL
  Filled 2018-07-13: qty 1

## 2018-07-13 MED ORDER — ACETAMINOPHEN 650 MG RE SUPP: 650.0000 mg | Freq: Four times a day (QID) | RECTAL | Status: DC | PRN

## 2018-07-13 MED ORDER — ENOXAPARIN SODIUM 30 MG/0.3ML ~~LOC~~ SOLN
30.0000 mg | SUBCUTANEOUS | Status: DC
  Administered 2018-07-13 – 2018-07-17 (×5): 30 mg via SUBCUTANEOUS
  Filled 2018-07-13 (×5): qty 0.3

## 2018-07-13 MED ORDER — SENNOSIDES-DOCUSATE SODIUM 8.6-50 MG PO TABS
1.0000 | ORAL_TABLET | Freq: Every evening | ORAL | Status: DC | PRN
  Administered 2018-08-01: 1 via ORAL
  Filled 2018-07-13: qty 1

## 2018-07-13 MED ORDER — ADULT MULTIVITAMIN W/MINERALS CH
1.0000 | ORAL_TABLET | Freq: Every day | ORAL | Status: DC
  Administered 2018-07-13 – 2018-08-01 (×20): 1 via ORAL
  Filled 2018-07-13 (×20): qty 1

## 2018-07-13 MED ORDER — SODIUM CHLORIDE 0.9 % IV BOLUS
1000.0000 mL | Freq: Once | INTRAVENOUS | Status: AC
  Administered 2018-07-13: 1000 mL via INTRAVENOUS

## 2018-07-13 MED ORDER — ONDANSETRON HCL 4 MG/2ML IJ SOLN
4.0000 mg | Freq: Four times a day (QID) | INTRAMUSCULAR | Status: DC | PRN
  Administered 2018-07-20: 4 mg via INTRAVENOUS
  Filled 2018-07-13: qty 2

## 2018-07-13 MED ORDER — FOLIC ACID 1 MG PO TABS
1.0000 mg | ORAL_TABLET | Freq: Every day | ORAL | Status: DC
  Administered 2018-07-13 – 2018-08-01 (×20): 1 mg via ORAL
  Filled 2018-07-13 (×20): qty 1

## 2018-07-13 NOTE — ED Notes (Signed)
Pt remains in US

## 2018-07-13 NOTE — H&P (Signed)
Date: 07/13/2018               Patient Name:  Ryan [name] MRN: [id number]  DOB: [birth year] Age / Sex: 68 y.o., male   PCP: Patient, No Pcp Per         Medical Service: Internal Medicine Teaching Service         Attending Physician: Dr. Rebeca Alert, Raynaldo Opitz, MD    First Contact: Dr. Gilberto Better Pager: [phone]  Second Contact: Dr. Kathi Ludwig Pager: [phone]       After Hours (After 5p/  First Contact Pager: [phone]  weekends / holidays): Second Contact Pager: [phone]   Chief Complaint: weight loss and weakness  History of Present Illness: Ryan [name] is a 68 yo male w/ a PMHx notable for alcohol use disorder, diabetes, and GAD who presented with a two month history of anorexia, weight loss reported at 60lbs, malaise and intermittent abdominal pain. He was in his usual state of health until 2 months ago when he began to have weakness, nausea and vomiting. This had followed a month of heavy EtOH use at 1.5 pints of bourbon daily and cocaine use. He states that his status had progressively worsened until he felt the need to come to ED for evaluation due to the weakness primarily. He also states he appears to be hallucinating at times and endorsing intermittent 'strange coughs.' He describes change in his stool quality to white.   On review of systems, he denies any BRBPR, melena. He mentions he had a colonoscopy at the [place] 5 years ago but did not recall anything specific about the results. He denies any prior history of upper endoscopy. He also mentions odynophagia, dysphagia and regurgiation (feels dry). Mentions difficulty keeping fluids and solids down.  For his regular visits, he states he is with the [place] but have not regularly went to his appointments as his PCP has passed. ED labs concerning for and AKI for which he was admitted.   Meds:  Current Meds  Medication Sig  . glipiZIDE (GLUCOTROL) 5 MG tablet Take 5 mg by mouth daily before breakfast.  . hydrOXYzine  (ATARAX/VISTARIL) 10 MG tablet Take 10 mg by mouth at bedtime as needed (sleep).   Allergies: Allergies as of 07/13/2018 - Review Complete 07/13/2018  Allergen Reaction Noted  . Ace inhibitors Cough 07/13/2018   History reviewed. No pertinent past medical history.  Family History:  Denied a FH of cancer.  Social History:  EtOH use intermittent, last drink two month prior Endorsed Cocaine use two months prior Denied tobacco use as well as heroin, or meth Lives alone. No social support. Income via disability  Review of Systems: A complete ROS was negative except as per HPI.   Physical Exam: Blood pressure (!) 125/106, pulse (!) 110, resp. rate (!) 24, weight 72.6 kg, SpO2 97 %. Physical Exam Constitutional:      General: He is not in acute distress.    Appearance: He is well-developed. He is not diaphoretic.  HENT:     Head: Normocephalic and atraumatic.  Eyes:     Conjunctiva/sclera: Conjunctivae normal.  Neck:     Musculoskeletal: Normal range of motion.  Cardiovascular:     Rate and Rhythm: Regular rhythm. Tachycardia present.     Heart sounds: No murmur.  Pulmonary:     Effort: Pulmonary effort is normal. No respiratory distress.     Breath sounds: Normal breath sounds. No stridor.  Abdominal:     General:  Bowel sounds are normal. There is no distension.     Palpations: Abdomen is soft. There is no mass.     Tenderness: There is no abdominal tenderness.  Musculoskeletal:        General: Deformity present. No tenderness.     Right lower leg: Edema present.     Left lower leg: Edema present.  Skin:    General: Skin is warm.     Capillary Refill: Capillary refill takes less than 2 seconds.     Coloration: Skin is not jaundiced or pale.     Comments: Poor skin turgor  Neurological:     Mental Status: He is alert and oriented to person, place, and time.  Psychiatric:        Behavior: Behavior normal.        Thought Content: Thought content normal.   EKG:  personally reviewed my interpretation is sinus tachycardia, normal sinus, T wave inversions in V5, v6, no prior EKG to compare.   CXR: personally reviewed my interpretation is no acute cardiopulmonary process  Assessment & Plan by Problem: Active Problems:   Acute kidney injury ([place])   History of alcohol use disorder   Weight loss, unintentional  Assessment: [name] is a 68 yo M w/ PMH of alcoholic cirrhosis and Y8FO admit for AKI and unintentional weight loss.  Plan: AKI: Endorsing chronic dysphagia/odynophagia with discomfort with both solids and liquids. No prior EGD. Mentions choking and coughing with solids and liquids. Symptoms improved after 1L bolus in ED. May have CKD considering history of diabetes but more likely due to volume loss in setting of summer heat + poor oral intake. - F/u Renal ultrasound - Gentle fluid resuscitation w/ hx of cirrhosis: NS 100cc/hr for 10 hrs - Trend renal function - Avoid nephrotoxic meds  Unintentional Weight loss: Endorsing 60 pound weight loss in 2 months but no record to confirm. Endorsing poor appetite and frequent nausea. Heavy hx of alcohol use until recently. High risk of hepatic/pancreatic malignancy, as well as esophageal/gastric pathology. Endorsing left sided abdominal pain. - Speech and swallow eval - Lipase - Zofran for nausea - Consider CT Abd/pelvis w contrast once AKI resolves  Hx of Cirrhosis: per chart review, hx of hepatitis C infection (confirmed via biopsy) s/p treatment, steatosis, and alcoholic cirrhosis from 2774 [AST 148, ALT 102, Bili of 20.1]. No record since then. Admit AST 31, ALT 19, Alk phos 232, T.bili 1.5. No obvious ascites on exam.  - RUQ ultrasound - PT/INR - Hep C RNA - Trend hepatic function - If worsening liver function, start lactulose   EtOH use disorder: Drinks 1.5 pint bourbon daily but states last drink was 1.5 months ago. Past timeframe for withdrawal but had hx of hyponatremia due to  beer potomania in the past. - CIWA w/ ATIVAN - Folate, Thiamine, Vitamins  T2DM Glipizide but past chart review shows metformin on med list. Insulin naive. - Hgb a1c - Glucose checks - Add SSI if hyperglycemic  DVT prophx: Enoxaparin Diet: 2 gram sodium diet Bowel: Senokot PRN Code: DNR  Dispo: Admit patient to Inpatient with expected length of stay greater than 2 midnights.  Signed: [name], MD 07/13/2018, 2:37 PM  Pager: [phone]

## 2018-07-13 NOTE — ED Notes (Signed)
Pt reports weight loss of approx 60lbs over the last 3 months. Complains of extreme fatigue.

## 2018-07-13 NOTE — ED Triage Notes (Addendum)
Pt in with poor appetite x few days, some n/v. States he just feels weak, HR 122 in triage

## 2018-07-14 ENCOUNTER — Other Ambulatory Visit: Payer: Self-pay

## 2018-07-14 DIAGNOSIS — K831 Obstruction of bile duct: Secondary | ICD-10-CM | POA: Insufficient documentation

## 2018-07-14 DIAGNOSIS — J90 Pleural effusion, not elsewhere classified: Secondary | ICD-10-CM

## 2018-07-14 DIAGNOSIS — E875 Hyperkalemia: Secondary | ICD-10-CM

## 2018-07-14 DIAGNOSIS — R1310 Dysphagia, unspecified: Secondary | ICD-10-CM

## 2018-07-14 DIAGNOSIS — K8020 Calculus of gallbladder without cholecystitis without obstruction: Secondary | ICD-10-CM

## 2018-07-14 DIAGNOSIS — F419 Anxiety disorder, unspecified: Secondary | ICD-10-CM

## 2018-07-14 DIAGNOSIS — Z87891 Personal history of nicotine dependence: Secondary | ICD-10-CM

## 2018-07-14 DIAGNOSIS — E872 Acidosis, unspecified: Secondary | ICD-10-CM | POA: Insufficient documentation

## 2018-07-14 DIAGNOSIS — K7030 Alcoholic cirrhosis of liver without ascites: Secondary | ICD-10-CM | POA: Diagnosis present

## 2018-07-14 DIAGNOSIS — D696 Thrombocytopenia, unspecified: Secondary | ICD-10-CM | POA: Insufficient documentation

## 2018-07-14 DIAGNOSIS — F1490 Cocaine use, unspecified, uncomplicated: Secondary | ICD-10-CM

## 2018-07-14 DIAGNOSIS — R809 Proteinuria, unspecified: Secondary | ICD-10-CM | POA: Insufficient documentation

## 2018-07-14 DIAGNOSIS — N19 Unspecified kidney failure: Secondary | ICD-10-CM

## 2018-07-14 DIAGNOSIS — R740 Nonspecific elevation of levels of transaminase and lactic acid dehydrogenase [LDH]: Secondary | ICD-10-CM

## 2018-07-14 DIAGNOSIS — Z794 Long term (current) use of insulin: Secondary | ICD-10-CM

## 2018-07-14 HISTORY — DX: Pleural effusion, not elsewhere classified: J90

## 2018-07-14 LAB — HEPATIC FUNCTION PANEL
ALT: 20 U/L (ref 0–44)
AST: 39 U/L (ref 15–41)
Albumin: 2.9 g/dL — ABNORMAL LOW (ref 3.5–5.0)
Alkaline Phosphatase: 272 U/L — ABNORMAL HIGH (ref 38–126)
Bilirubin, Direct: 0.5 mg/dL — ABNORMAL HIGH (ref 0.0–0.2)
Indirect Bilirubin: 0.7 mg/dL (ref 0.3–0.9)
Total Bilirubin: 1.2 mg/dL (ref 0.3–1.2)
Total Protein: 7.4 g/dL (ref 6.5–8.1)

## 2018-07-14 LAB — CBC
HCT: 53.8 % — ABNORMAL HIGH (ref 39.0–52.0)
Hemoglobin: 17.6 g/dL — ABNORMAL HIGH (ref 13.0–17.0)
MCH: 30 pg (ref 26.0–34.0)
MCHC: 32.7 g/dL (ref 30.0–36.0)
MCV: 91.8 fL (ref 80.0–100.0)
Platelets: 103 10*3/uL — ABNORMAL LOW (ref 150–400)
RBC: 5.86 MIL/uL — ABNORMAL HIGH (ref 4.22–5.81)
RDW: 13.6 % (ref 11.5–15.5)
WBC: 5.5 10*3/uL (ref 4.0–10.5)
nRBC: 0 % (ref 0.0–0.2)

## 2018-07-14 LAB — HEMOGLOBIN A1C
Hgb A1c MFr Bld: 8.3 % — ABNORMAL HIGH (ref 4.8–5.6)
Mean Plasma Glucose: 191.51 mg/dL

## 2018-07-14 LAB — SEDIMENTATION RATE: Sed Rate: 1 mm/hr (ref 0–16)

## 2018-07-14 LAB — PROTEIN / CREATININE RATIO, URINE
Creatinine, Urine: 195 mg/dL
Protein Creatinine Ratio: 3.03 mg/mg{Cre} — ABNORMAL HIGH (ref 0.00–0.15)
Total Protein, Urine: 591 mg/dL

## 2018-07-14 LAB — BASIC METABOLIC PANEL
Anion gap: 11 (ref 5–15)
BUN: 54 mg/dL — ABNORMAL HIGH (ref 8–23)
CO2: 14 mmol/L — ABNORMAL LOW (ref 22–32)
Calcium: 9.1 mg/dL (ref 8.9–10.3)
Chloride: 114 mmol/L — ABNORMAL HIGH (ref 98–111)
Creatinine, Ser: 2.17 mg/dL — ABNORMAL HIGH (ref 0.61–1.24)
GFR calc Af Amer: 35 mL/min — ABNORMAL LOW (ref >60)
GFR calc non Af Amer: 30 mL/min — ABNORMAL LOW (ref >60)
Glucose, Bld: 223 mg/dL — ABNORMAL HIGH (ref 70–99)
Potassium: 5.6 mmol/L — ABNORMAL HIGH (ref 3.5–5.1)
Sodium: 139 mmol/L (ref 135–145)

## 2018-07-14 LAB — C-REACTIVE PROTEIN: CRP: 8.3 mg/dL — ABNORMAL HIGH (ref <1.0)

## 2018-07-14 LAB — GAMMA GT: GGT: 258 U/L — ABNORMAL HIGH (ref 7–50)

## 2018-07-14 LAB — PHOSPHORUS: Phosphorus: 4.2 mg/dL (ref 2.5–4.6)

## 2018-07-14 LAB — TSH: TSH: 7.75 u[IU]/mL — ABNORMAL HIGH (ref 0.350–4.500)

## 2018-07-14 LAB — BETA-HYDROXYBUTYRIC ACID: Beta-Hydroxybutyric Acid: 0.5 mmol/L — ABNORMAL HIGH (ref 0.05–0.27)

## 2018-07-14 LAB — LACTIC ACID, PLASMA
Lactic Acid, Venous: 2.9 mmol/L (ref 0.5–1.9)
Lactic Acid, Venous: 2.7 mmol/L (ref 0.5–1.9)
Lactic Acid, Venous: 3.7 mmol/L (ref 0.5–1.9)
Lactic Acid, Venous: 2 mmol/L (ref 0.5–1.9)

## 2018-07-14 LAB — HIV ANTIBODY (ROUTINE TESTING W REFLEX): HIV Screen 4th Generation wRfx: NONREACTIVE

## 2018-07-14 LAB — GLUCOSE, CAPILLARY
Glucose-Capillary: 174 mg/dL — ABNORMAL HIGH (ref 70–99)
Glucose-Capillary: 167 mg/dL — ABNORMAL HIGH (ref 70–99)
Glucose-Capillary: 113 mg/dL — ABNORMAL HIGH (ref 70–99)
Glucose-Capillary: 120 mg/dL — ABNORMAL HIGH (ref 70–99)

## 2018-07-14 LAB — PROTIME-INR
INR: 1.7 — ABNORMAL HIGH (ref 0.8–1.2)
Prothrombin Time: 19.9 seconds — ABNORMAL HIGH (ref 11.4–15.2)

## 2018-07-14 LAB — T4, FREE: Free T4: 0.89 ng/dL (ref 0.82–1.77)

## 2018-07-14 LAB — OCCULT BLOOD X 1 CARD TO LAB, STOOL: Fecal Occult Bld: NEGATIVE

## 2018-07-14 MED ORDER — ENSURE ENLIVE PO LIQD: 237.0000 mL | Freq: Two times a day (BID) | ORAL | Status: DC

## 2018-07-14 MED ORDER — GLUCERNA SHAKE PO LIQD
237.0000 mL | Freq: Three times a day (TID) | ORAL | Status: DC
  Administered 2018-07-14: 237 mL via ORAL

## 2018-07-14 MED ORDER — VITAMIN B-1 100 MG PO TABS
100.0000 mg | ORAL_TABLET | Freq: Every day | ORAL | Status: DC
  Administered 2018-07-15 – 2018-08-01 (×18): 100 mg via ORAL
  Filled 2018-07-14 (×18): qty 1

## 2018-07-14 MED ORDER — INSULIN GLARGINE 100 UNIT/ML ~~LOC~~ SOLN
10.0000 [IU] | Freq: Every day | SUBCUTANEOUS | Status: DC
  Administered 2018-07-14 – 2018-07-24 (×10): 10 [IU] via SUBCUTANEOUS
  Filled 2018-07-14 (×13): qty 0.1

## 2018-07-14 MED ORDER — NEPRO/CARBSTEADY PO LIQD
237.0000 mL | Freq: Two times a day (BID) | ORAL | Status: DC
  Administered 2018-07-14 – 2018-07-17 (×4): 237 mL via ORAL

## 2018-07-14 MED ORDER — SODIUM POLYSTYRENE SULFONATE 15 GM/60ML PO SUSP
30.0000 g | Freq: Four times a day (QID) | ORAL | Status: AC
  Administered 2018-07-14 (×2): 30 g via ORAL
  Filled 2018-07-14 (×3): qty 120

## 2018-07-14 MED ORDER — INSULIN ASPART 100 UNIT/ML ~~LOC~~ SOLN
0.0000 [IU] | Freq: Three times a day (TID) | SUBCUTANEOUS | Status: DC
  Administered 2018-07-14 – 2018-07-15 (×3): 3 [IU] via SUBCUTANEOUS
  Administered 2018-07-15: 5 [IU] via SUBCUTANEOUS
  Administered 2018-07-16: 8 [IU] via SUBCUTANEOUS
  Administered 2018-07-17 – 2018-07-18 (×3): 3 [IU] via SUBCUTANEOUS
  Administered 2018-07-18: 2 [IU] via SUBCUTANEOUS
  Administered 2018-07-19: 3 [IU] via SUBCUTANEOUS
  Administered 2018-07-19: 2 [IU] via SUBCUTANEOUS
  Administered 2018-07-20: 3 [IU] via SUBCUTANEOUS
  Administered 2018-07-21: 8 [IU] via SUBCUTANEOUS
  Administered 2018-07-22 – 2018-07-23 (×3): 5 [IU] via SUBCUTANEOUS
  Administered 2018-07-23: 2 [IU] via SUBCUTANEOUS
  Administered 2018-07-23 – 2018-07-24 (×3): 3 [IU] via SUBCUTANEOUS
  Administered 2018-07-24 – 2018-07-25 (×2): 2 [IU] via SUBCUTANEOUS
  Administered 2018-07-25: 3 [IU] via SUBCUTANEOUS
  Administered 2018-07-26 (×2): 2 [IU] via SUBCUTANEOUS
  Administered 2018-07-27 – 2018-07-28 (×2): 3 [IU] via SUBCUTANEOUS

## 2018-07-14 MED ORDER — SODIUM CHLORIDE 0.9 % IV SOLN: INTRAVENOUS | Status: DC

## 2018-07-14 MED ORDER — LACTATED RINGERS IV SOLN
INTRAVENOUS | Status: DC
  Administered 2018-07-14 – 2018-07-15 (×4): via INTRAVENOUS

## 2018-07-14 NOTE — Progress Notes (Signed)
CRITICAL VALUE ALERT  Critical Value: lactic acid 2.9  Date & Time Notied:  07/14/18 0845  Provider Notified: MD on call  Orders Received/Actions taken:no orders given

## 2018-07-14 NOTE — Progress Notes (Signed)
SLP Cancellation Note  Patient Details Name: Ryan [name] MRN: [id number] DOB: 04-29-1950   Cancelled treatment:       Reason Eval/Treat Not Completed: Patient declined, no reason specified; pt stated "I don't want anything to eat" and appeared anxious upon SLP arrival; ST will continue efforts as schedule allows.   Elvina Sidle 07/14/2018, 3:35 PM

## 2018-07-14 NOTE — Progress Notes (Signed)
   Subjective: He states that his appetite is not up to par. Denies nausea, vomiting, abdominal pain, cramps, fevers, chills, hemoptysis. He does endorse a 2 month history of cough.. He continues to deny tobacco use but he reports that his last use of cigarette was 25 years ago. He usually does not use supplemental oxygen at home.   Objective:  Vital signs in last 24 hours: Vitals:   07/14/18 0022 07/14/18 0200 07/14/18 0540 07/14/18 0600  BP: (!) 128/101 112/84 120/86   Pulse: (!) 111 (!) 110 78   Resp: '20 20 20   '$ Temp: 97.7 F (36.5 C) 97.9 F (36.6 C) 97.9 F (36.6 C)   TempSrc: Axillary Oral Oral   SpO2: 98% 97% 98%   Weight:    69.5 kg  Height:    '5\' 8"'$  (1.727 m)   General: A/O x4, in no acute distress, afebrile, nondiaphoretic Cardio: RRR, no mrg's  Pulmonary: CTA bilaterally, no wheezing or crackles  Abdomen: Bowel sounds normal, soft, nontender  MSK: BLE nontender, nonedematous Psych: Appropriate affect, not depressed in appearance, engages well  Assessment/Plan:  Active Problems:   Acute kidney injury ([place])   History of alcohol use disorder   Weight loss, unintentional  Assessment: [name] is a 68 yo M w/ PMHx significant EtOH use disorder, EtOH cirrhosis, T2DM who was admitted for an acute renal injury and unintentional weight loss. He was found to have a markedly elevated serum creatinine, decreased urinary output and hyperkalemia.  Plan: Elevated serum creatinine, likely representing either an acute renal injury vs CKD: Uncertain etiology or recent baseline.  Minimal improvement since admission with serum creatinine 2.17 this a.m.  Potassium has again risen to 5.6 from previous 4.8. Given the relative low degree of renal impairment I am uncertain this is sufficient to induce the severity of hyperkalemia present.  In addition there is an associated decrease in the patient's serum bicarb concerning for metabolic acidosis.  Patient continues to appear hypovolemic  on exam. Hypoaldosteronism is possible.  -Aldosterone + renin with ratio ordered -Trending lactic acidosis, initial 2.9 -Continue IV fluids 125m/hr, increase to 1517mhr if lactate increasing -Kayexalate x2 doses for mild hypokalemia  Unintentional Weight Loss: Uncertain etiology, on the differential remains concerned for pancreatic/colon neoplasm, hypothyroidism, as he appears to have adequate access to nutrition.  No prior weights in system to confirm. -TSH elevated, have T3 and free T4 -CRP elevated 8.3 sed rate pending  Hx of cirrhosis: Decreased liver function.  Elevated PT 19.9 INR is 1.7, albumin 2.9, alk phos elevated 273 with GGT elevated to 58 consistent with hepatic source.  Would like CT with contrast of the abdomen pelvis given concern for hepatic versus pancreatic malignancy.  Possibility for: Malignancy with liver metastasis.  Trace ascites on ultrasound. -Patient would need GI follow-up  EtOH use disorder: On CIWA without Ativan.  Last reported drink 2 months prior  T2DM: CBGs in the upper 170s and 180s.  A1c elevated greater than 8%. -Continue SSI insulin -Initiate insulin glargine 10 units nightly  Diet: 2 gram sodium Fluids: As above Code: DNR DVT PPX: Enoxaparin  Dispo: Anticipated discharge in approximately 1-2 day(s).   HaKathi LudwigMD 07/14/2018, 7:00 AM Pager: Pager# [phone]

## 2018-07-14 NOTE — Progress Notes (Signed)
CRITICAL VALUE ALERT  Critical Value:  Lactic acid 3.7 Date & Time Notied:  07/14/18 1755  Provider Notified: MD on call  Orders Received/Actions taken: awaiting orders

## 2018-07-14 NOTE — Progress Notes (Signed)
Initial Nutrition Assessment  DOCUMENTATION CODES:   Not applicable  INTERVENTION:   Nepro Shake po BID, each supplement provides 425 kcal and 19 grams protein  MVI, thiamine and folic acid in setting of etoh abuse  Dysphagia 3 diet   Pt at high refeed risk; recommend monitor K, Mg and P labs daily once oral intake improves  NUTRITION DIAGNOSIS:   Inadequate oral intake related to poor appetite as evidenced by meal completion < 25%.  GOAL:   Patient will meet greater than or equal to 90% of their needs  MONITOR:   PO intake, Supplement acceptance, Skin, Labs, Weight trends, I & O's  REASON FOR ASSESSMENT:   Consult Poor PO  ASSESSMENT:   68 yo male w/ a PMHx notable for alcohol use disorder, substance abuse, cirrhosis, Hep C, diabetes who presents with wt loss, dysphaiga, nausea and abdominal pain x 2 months.   RD working remotely.  Unable to reach pt via phone x multiple attempts. Per chart review, pt with nausea, vomiting and difficulty swallowing foods for several months. Pt reports a 60lb weigtht loss over the past few months; there is no documented weight history to confirm weight loss. Pt currently eating <25% of meals. Will change pt to a dysphagia 3 diet and add supplements. Suspect pt at high refeed risk; monitor electrolytes.     Pt at high risk for malnutrition but unable to diagnose at this time as nutrition focused physical exam cannot be performed.   Medications reviewed and include: lovenox, folic acid, insulin, MVI, thiamine, LRS _0 /hr   Labs reviewed: K 5.6(H), BUN 54(H), creat 2.17(H), P 4.2 wnl, alk phos 272(H) cbgs- 174, 167 x 24 hrs  Unable to complete Nutrition-Focused physical exam at this time.   Diet Order:   Diet Order            Diet 2 gram sodium Room service appropriate? Yes; Fluid consistency: Thin  Diet effective now             EDUCATION NEEDS:   No education needs have been identified at this time  Skin:  Skin  Assessment: Reviewed RN Assessment  Last BM:  6/4  Height:   Ht Readings from Last 1 Encounters:  07/14/18 _1  (1.727 m)    Weight:   Wt Readings from Last 1 Encounters:  07/14/18 69.5 kg    Ideal Body Weight:  70 kg  BMI:  Body mass index is 23.3 kg/m.  Estimated Nutritional Needs:   Kcal:  1900-2200kcal/day   Protein:  95-110g/day   Fluid:  >1.8L/day   Koleen Distance MS, RD, LDN Pager #- [phone] Office#- [phone] After Hours Pager: [phone]

## 2018-07-14 NOTE — Evaluation (Signed)
Occupational Therapy Evaluation Patient Details Name: Ryan [name] MRN: [id number] DOB: Jul 02, 1950 Today's Date: 07/14/2018    History of Present Illness 68 yo male w/ a PMHx notable for alcohol use disorder, diabetes, and GAD who presented with a two month history of anorexia, weight loss reported at 60lbs, malaise and intermittent abdominal pain.   Clinical Impression   Pt admitted with the above diagnoses and presents with below problem list. Pt will benefit from continued acute OT to address the below listed deficits and maximize independence with basic ADLs prior to d/c home. Pt is from home where he lives alone, reports he has no one to assist him after d/c. Pt reports he did not use AD for ambulation at home. Limited session due to lethargy and onset of dizziness (suspect med related). Pt currently min to mod A with UB/LB ADLs and sit<>stand transfers. Pt was able to sidestep before initiate sitting back down due to dizziness. Hopeful that pt will progress to a level he can d/c back to home at mod I level. Feel he would benefit from [facility] therapies.      Follow Up Recommendations  Home health OT;Supervision/Assistance - 24 hour    Equipment Recommendations  3 in 1 bedside commode    Recommendations for Other Services PT consult     Precautions / Restrictions Precautions Precautions: Fall Restrictions Weight Bearing Restrictions: No      Mobility Bed Mobility Overal bed mobility: Needs Assistance Bed Mobility: Supine to Sit;Sit to Supine     Supine to sit: Min guard Sit to supine: Min guard   General bed mobility comments: min guard for safety  Transfers Overall transfer level: Needs assistance Equipment used: Rolling walker (2 wheeled) Transfers: Sit to/from Stand Sit to Stand: Min assist         General transfer comment: Pt requesting external support prior to standing. mod A to steady pt and stabilize rw coming from EOB. Pt noted to placed BUE across rw and  pull up. Lethargy and dizziness impacting session.     Balance Overall balance assessment: Needs assistance Sitting-balance support: Bilateral upper extremity supported;Feet supported Sitting balance-Leahy Scale: Fair     Standing balance support: Bilateral upper extremity supported;During functional activity Standing balance-Leahy Scale: Poor Standing balance comment: rw for support in standing                           ADL either performed or assessed with clinical judgement   ADL Overall ADL's : Needs assistance/impaired Eating/Feeding: Set up;Sitting   Grooming: Set up;Sitting   Upper Body Bathing: Set up;Sitting;Minimal assistance   Lower Body Bathing: Sit to/from stand;Moderate assistance   Upper Body Dressing : Set up;Sitting;Minimal assistance   Lower Body Dressing: Sit to/from stand;Moderate assistance                 General ADL Comments: Pt completed bed mobility and side stepped along EOB. Session limited due to dizziness once EOB and lethargy.      Vision         Perception     Praxis      Pertinent Vitals/Pain Pain Assessment: Faces Faces Pain Scale: Hurts a little bit Pain Location: grimacing with bed mobility Pain Intervention(s): Monitored during session     Hand Dominance     Extremity/Trunk Assessment Upper Extremity Assessment Upper Extremity Assessment: Difficult to assess due to impaired cognition;Generalized weakness   Lower Extremity Assessment Lower Extremity Assessment: Defer to  PT evaluation       Communication Communication Communication: No difficulties   Cognition Arousal/Alertness: Lethargic;Suspect due to medications Behavior During Therapy: Flat affect Overall Cognitive Status: Difficult to assess                                 General Comments: difficult to assess due to level of arousal, suspect due to meds. Delayed resposes and decreased initiation. Eyes often closed. A&O to person  and place.   General Comments       Exercises     Shoulder Instructions      Home Living Family/patient expects to be discharged to:: Private residence Living Arrangements: Alone Available Help at Discharge: (Pt reports no one to assist at d/c.) Type of Home: House Home Access: Level entry     Home Layout: One level     Bathroom Shower/Tub: Tub/shower unit                    Prior Functioning/Environment Level of Independence: Independent        Comments: Pt reports he did not use AD PTA        OT Problem List: Decreased strength;Decreased activity tolerance;Impaired balance (sitting and/or standing);Decreased knowledge of use of DME or AE;Decreased knowledge of precautions;Cardiopulmonary status limiting activity;Pain      OT Treatment/Interventions: Self-care/ADL training;DME and/or AE instruction;Energy conservation;Therapeutic activities;Patient/family education;Balance training    OT Goals(Current goals can be found in the care plan section) Acute Rehab OT Goals Patient Stated Goal: not stated OT Goal Formulation: With patient Time For Goal Achievement: 07/28/18 Potential to Achieve Goals: Good ADL Goals Pt Will Perform Grooming: with modified independence;sitting;standing Pt Will Perform Upper Body Bathing: with modified independence;sitting Pt Will Perform Lower Body Bathing: with modified independence;sit to/from stand Pt Will Perform Upper Body Dressing: with modified independence;sitting Pt Will Perform Lower Body Dressing: with modified independence;sit to/from stand Pt Will Transfer to Toilet: with modified independence;ambulating Pt Will Perform Toileting - Clothing Manipulation and hygiene: with modified independence;sit to/from stand Pt Will Perform Tub/Shower Transfer: Tub transfer;with modified independence;ambulating;3 in 1;rolling walker  OT Frequency: Min 3X/week   Barriers to D/C: Decreased caregiver support  pt reports no one  available to assist at d/c       Co-evaluation              AM-PAC OT "6 Clicks" Daily Activity     Outcome Measure Help from another person eating meals?: None Help from another person taking care of personal grooming?: A Little Help from another person toileting, which includes using toliet, bedpan, or urinal?: A Lot Help from another person bathing (including washing, rinsing, drying)?: A Lot Help from another person to put on and taking off regular upper body clothing?: None Help from another person to put on and taking off regular lower body clothing?: A Lot 6 Click Score: 17   End of Session Equipment Utilized During Treatment: Rolling walker;Oxygen(2L) Nurse Communication: Mobility status;Other (comment)(dizzy once sitting EOB; lethargic)  Activity Tolerance: Other (comment);Patient limited by lethargy(dizzy once sitting EOB) Patient left: in bed;with call bell/phone within reach;with bed alarm set  OT Visit Diagnosis: Unsteadiness on feet (R26.81);Muscle weakness (generalized) (M62.81);Pain;Dizziness and giddiness (R42)                Time: 0630-1601 OT Time Calculation (min): 18 min Charges:  OT General Charges $OT Visit: 1 Visit OT Evaluation $OT Eval Low Complexity:  Ignacio, OT Acute Rehabilitation Services Pager: [phone] Office: [place], [place] 07/14/2018, 10:40 AM

## 2018-07-14 NOTE — Plan of Care (Signed)
  Problem: Education: Goal: Knowledge of General Education information will improve Description: Including pain rating scale, medication(s)/side effects and non-pharmacologic comfort measures Outcome: Progressing   Problem: Clinical Measurements: Goal: Ability to maintain clinical measurements within normal limits will improve Outcome: Progressing   

## 2018-07-14 NOTE — Progress Notes (Signed)
Date: 07/14/2018  Patient name: Ryan [name]  Medical record number: [id number]  Date of birth: Feb 26, 1950   I saw and evaluated the patient. I reviewed the resident's note and I agree with the resident's findings and plan as documented in the resident's note.  Chief Complaint(s): Weight loss  History - key components related to admission:  Please see resident note for details.  Briefly, this is a 68 year old man with a history of alcohol use disorder, anxiety, and diabetes presenting with 2 months of unintentional weight loss.  About 2 months ago he was drinking heavily and using cocaine, and since then he has had gradually worsening weakness, abdominal pain, vomiting, and 60 pounds of weight loss.  He reports dysphagia and odynophagia as well as occasional regurgitation.  He also reports some white stools.  He also notes intermittent dry coughing.  Physical Exam - key components related to admission:  Vitals:   07/14/18 1000 07/14/18 1239 07/14/18 1815 07/14/18 1820  BP: (!) 136/106 (!) 130/95 (!) 116/104 (!) 125/92  Pulse: (!) 112 (!) 110 (!) 106 (!) 108  Resp:  20    Temp:   97.7 F (36.5 C)   TempSrc:   Oral   SpO2: 93% 99% 100%   Weight:      Height:        General: Alert, appears thin and frail HEENT: Dry mucous membranes Neck: No JVD, no lymphadenopathy Cardiovascular: Regular rate and rhythm, no murmurs rubs or gallops Pulmonary: Tachypneic, clear to auscultation bilaterally Abdominal: Soft, mild diffuse tenderness, not distended, normal bowel sounds Extremities: Warm, trace lower extremity edema Skin: No significant rashes or lesions Neuro: Alert and oriented, speech normal, generalized weakness  Significant test results: Lactate 2.9, 2.7, 3.7 Creatinine 2.4 Potassium 5.9, 4.8, 5.6 Bicarb 15, 18, 14  Albumin 3.0 Alkaline phosphatase 272, GGT 258 AST/ALT 31/19 INR 1.7  WBC 5.5, Hgb 17.6, platelets 103 CRP 8, ESR 1 TSH 7.8, FT4 0.9 HIV negative  UA  with large protein, urine protein:creatinine ratio 3  CXR with cardiomegaly, no acute findings RUQ US shows cholelithiasis, cirrhosis, small amount of ascites around the right kidney, right pleural effusion Renal US shows no significant abnormalities  Assessment and Plan: I have seen and evaluated the patient as outlined in the resident's note. I agree with the formulated Assessment and Plan as detailed in the resident's note, with the following changes:   Principal Problem:   Weight loss, unintentional Active Problems:   Acute kidney injury ([place])   History of alcohol use disorder   Proteinuria   Lactic acidosis   Cholestasis   Thrombocytopenia ([facility])   Alcoholic cirrhosis ([facility])   Hyperkalemia   Metabolic acidosis, normal anion gap (NAG)   Pleural effusion   1.  Unintentional weight loss: He reports 60 pounds of weight loss, unable to confirm, but he appears cachectic, he also reports dysphagia and odynophagia as well as intermittent cough.  High concern for malignancy, possible locations include lungs, esophagus/stomach, pancreas, and liver -TSH mildly elevated at 7.8, following up -CRP 8, suggestive of chronic inflammation or infection -Once renal function has stabilized, CT chest/abdomen/pelvis  2. Renal failure, unknown chronicity: No prior labs in the chart to indicate his baseline possible acute kidney injury from hypovolemia, but CKD seems more likely given the degree of his proteinuria -IV fluid hydration with LR -Trend BUN/creatinine -If does not improve, will consult nephrology as he has urine P: C greater than 3  3.  Metabolic acidosis with elevated lactate: Anion  gap has closed, but lactic acid increasing now to 3.7, unclear etiology, no medications likely causing this.  Mesenteric ischemia is a consideration given his intermittent abdominal pain, will check imaging as above. -Continue IV fluid resuscitation -RTA type IV is a consideration, will check renin and  aldosterone levels in the morning  4.  Cirrhosis: Likely due to alcohol use and reported history of hepatitis C, explains his thrombocytopenia, INR, mildly elevated bilirubin.  Alk phos and GGT more elevated than what expected, concerning for cholestasis, CBD normal on [place], so possibly intrahepatic cholestasis. -Checking hepatitis C viral load -CT chest/abdomen/pelvis  Oda Kilts, MD 6/6/20207:00 PM

## 2018-07-14 NOTE — Progress Notes (Signed)
PT Cancellation Note  Patient Details Name: Ryan [name] MRN: [id number] DOB: Nov 30, 1950   Cancelled Treatment:    Reason Eval/Treat Not Completed: Patient declined, no reason specified.  Pt reports he is not wanting any PT today, and was making some coughing sounds as well.  Will reattempt at another time.   Ramond Dial 07/14/2018, 4:09 PM   Mee Hives, PT MS Acute Rehab Dept. Number: [place] and [place]

## 2018-07-15 ENCOUNTER — Inpatient Hospital Stay (HOSPITAL_COMMUNITY): Payer: Medicare Other

## 2018-07-15 LAB — BASIC METABOLIC PANEL
Anion gap: 10 (ref 5–15)
BUN: 52 mg/dL — ABNORMAL HIGH (ref 8–23)
CO2: 16 mmol/L — ABNORMAL LOW (ref 22–32)
Calcium: 8.8 mg/dL — ABNORMAL LOW (ref 8.9–10.3)
Chloride: 115 mmol/L — ABNORMAL HIGH (ref 98–111)
Creatinine, Ser: 2.23 mg/dL — ABNORMAL HIGH (ref 0.61–1.24)
GFR calc Af Amer: 34 mL/min — ABNORMAL LOW (ref >60)
GFR calc non Af Amer: 29 mL/min — ABNORMAL LOW (ref >60)
Glucose, Bld: 125 mg/dL — ABNORMAL HIGH (ref 70–99)
Potassium: 4.5 mmol/L (ref 3.5–5.1)
Sodium: 141 mmol/L (ref 135–145)

## 2018-07-15 LAB — GLUCOSE, CAPILLARY
Glucose-Capillary: 207 mg/dL — ABNORMAL HIGH (ref 70–99)
Glucose-Capillary: 174 mg/dL — ABNORMAL HIGH (ref 70–99)
Glucose-Capillary: 68 mg/dL — ABNORMAL LOW (ref 70–99)
Glucose-Capillary: 79 mg/dL (ref 70–99)
Glucose-Capillary: 83 mg/dL (ref 70–99)

## 2018-07-15 LAB — LACTIC ACID, PLASMA
Lactic Acid, Venous: 2.1 mmol/L (ref 0.5–1.9)
Lactic Acid, Venous: 2.2 mmol/L (ref 0.5–1.9)

## 2018-07-15 LAB — T3: T3, Total: 46 ng/dL — ABNORMAL LOW (ref 71–180)

## 2018-07-15 MED ORDER — BENZONATATE 100 MG PO CAPS
100.0000 mg | ORAL_CAPSULE | Freq: Two times a day (BID) | ORAL | Status: DC
  Administered 2018-07-15 – 2018-08-01 (×35): 100 mg via ORAL
  Filled 2018-07-15 (×35): qty 1

## 2018-07-15 MED ORDER — HYDROCORTISONE (PERIANAL) 2.5 % EX CREA
TOPICAL_CREAM | Freq: Two times a day (BID) | CUTANEOUS | Status: DC
  Administered 2018-07-15 – 2018-07-26 (×13): via RECTAL
  Administered 2018-07-27: 1 via RECTAL
  Administered 2018-07-28 – 2018-07-29 (×2): via RECTAL
  Administered 2018-07-30: 1 via RECTAL
  Administered 2018-07-31: 10:00:00 via RECTAL
  Filled 2018-07-15 (×2): qty 28.35

## 2018-07-15 NOTE — Evaluation (Signed)
Physical Therapy Evaluation Patient Details Name: Ryan [name] MRN: [id number] DOB: 1950/09/17 Today's Date: 07/15/2018   History of Present Illness  68 yo male w/ a PMHx notable for alcohol use disorder, diabetes, and GAD who presented with a two month history of anorexia, weight loss reported at 60lbs, malaise and intermittent abdominal pain.  Clinical Impression  Pt admitted with above diagnosis. Pt currently with functional limitations due to the deficits listed below (see PT Problem List). Independent prior to admission; Presents to PT with generalized weakness, and Failure to Thrive; will work on strengthening and endurance as he lives alone; Is he a candidate for inpatient alcohol rehab? Pt will benefit from skilled PT to increase their independence and safety with mobility to allow discharge to the venue listed below.       Follow Up Recommendations Home health PT    Equipment Recommendations  Other (comment)(cane vs RW)    Recommendations for Other Services       Precautions / Restrictions Precautions Precautions: Fall      Mobility  Bed Mobility Overal bed mobility: Needs Assistance Bed Mobility: Supine to Sit     Supine to sit: Supervision     General bed mobility comments: Supervision for safety; no difficulty noted  Transfers Overall transfer level: Needs assistance Equipment used: None Transfers: Sit to/from Stand Sit to Stand: Min guard         General transfer comment: [place] for safety  Ambulation/Gait Ambulation/Gait assistance: Min assist Gait Distance (Feet): 5 Feet Assistive device: 1 person hand held assist Gait Pattern/deviations: Step-through pattern     General Gait Details: Walked 5 feet bed to chair  Stairs            Wheelchair Mobility    Modified Rankin (Stroke Patients Only)       Balance     Sitting balance-Leahy Scale: Fair       Standing balance-Leahy Scale: Fair                              Pertinent Vitals/Pain Pain Assessment: No/denies pain    Home Living Family/patient expects to be discharged to:: Private residence Living Arrangements: Alone Available Help at Discharge: Other (Comment)(tells me he will look into getting some help) Type of Home: House Home Access: Level entry     Home Layout: One level        Prior Function Level of Independence: Independent         Comments: Pt reports he did not use AD PTA     Hand Dominance        Extremity/Trunk Assessment   Upper Extremity Assessment Upper Extremity Assessment: Generalized weakness    Lower Extremity Assessment Lower Extremity Assessment: Generalized weakness       Communication   Communication: No difficulties  Cognition Arousal/Alertness: Awake/alert Behavior During Therapy: WFL for tasks assessed/performed Overall Cognitive Status: Within Functional Limits for tasks assessed(for simple mobility tasks)                                        General Comments General comments (skin integrity, edema, etc.): Session conducted on room air and O2 sats remained greater than or equal to 92%    Exercises     Assessment/Plan    PT Assessment Patient needs continued PT services  PT Problem List Decreased strength;Decreased  activity tolerance;Decreased balance;Decreased mobility;Decreased cognition;Decreased knowledge of use of DME;Decreased safety awareness       PT Treatment Interventions DME instruction;Gait training;Stair training;Functional mobility training;Therapeutic activities;Therapeutic exercise;Patient/family education    PT Goals (Current goals can be found in the Care Plan section)  Acute Rehab PT Goals Patient Stated Goal: Agreeable to getting OOB PT Goal Formulation: With patient Time For Goal Achievement: 07/29/18 Potential to Achieve Goals: Good    Frequency Min 3X/week   Barriers to discharge Decreased caregiver support Lives alone; Told OT  there is no one to help him at dc    Co-evaluation               AM-PAC PT "6 Clicks" Mobility  Outcome Measure Help needed turning from your back to your side while in a flat bed without using bedrails?: None Help needed moving from lying on your back to sitting on the side of a flat bed without using bedrails?: None Help needed moving to and from a bed to a chair (including a wheelchair)?: A Little Help needed standing up from a chair using your arms (e.g., wheelchair or bedside chair)?: A Little Help needed to walk in hospital room?: A Little Help needed climbing 3-5 steps with a railing? : A Little 6 Click Score: 20    End of Session Equipment Utilized During Treatment: Gait belt Activity Tolerance: Patient tolerated treatment well Patient left: in chair;with call bell/phone within reach;with chair alarm set Nurse Communication: Mobility status PT Visit Diagnosis: Unsteadiness on feet (R26.81);Other abnormalities of gait and mobility (R26.89)    Time: 8295-6213 PT Time Calculation (min) (ACUTE ONLY): 21 min   Charges:   PT Evaluation $PT Eval Low Complexity: [place], PT  Acute Rehabilitation Services Pager [phone] Office [phone]   Colletta [place] 07/15/2018, 5:10 PM

## 2018-07-15 NOTE — Evaluation (Signed)
Clinical/Bedside Swallow Evaluation Patient Details  Name: Ryan [name] MRN: [id number] Date of Birth: 19-Aug-1950  Today's Date: 07/15/2018 Time: SLP Start Time (ACUTE ONLY): 1150 SLP Stop Time (ACUTE ONLY): 1202 SLP Time Calculation (min) (ACUTE ONLY): 12 min  Past Medical History: History reviewed. No pertinent past medical history. Past Surgical History: History reviewed. No pertinent surgical history. HPI:  68 yo male w/ a PMHx notable for alcohol use disorder, diabetes, and GAD who presented with a two month history of anorexia, weight loss reported at 60lbs, malaise and intermittent abdominal pain. He was in his usual state of health until 2 months ago when he began to have weakness, nausea and vomiting. This had followed a month of heavy EtOH use at 1.5 pints of bourbon daily and cocaine use. He states that his status had progressively worsened until he felt the need to come to ED for evaluation due to the weakness primarily. He also states he appears to be hallucinating at times and endorsing intermittent 'strange coughs   Assessment / Plan / Recommendation Clinical Impression  Clinical swallow evaluation limited as pt only agreeable for thin liquids this date despite encouragement. Of note, pt also declined prior ST clinical swallow evaluation attempt 6/6.  Three ounce water challenge was unremarkable, no overt s/sx of aspiration. Pt with recent hx of decreased PO intake and unintentional weight loss. Pt and RN deny overt difficulty with POs. Continue mechanical soft diet and thin liquids, advance to regular consistencies as tolerated per MD discretion. No further ST needs identified.   SLP Visit Diagnosis: Dysphagia, unspecified (R13.10)    Aspiration Risk  Mild aspiration risk    Diet Recommendation     Medication Administration: Whole meds with liquid    Other  Recommendations Oral Care Recommendations: Oral care BID   Follow up Recommendations        Frequency and  Duration            Prognosis        Swallow Study   General Date of Onset: 07/13/18 HPI: 68 yo male w/ a PMHx notable for alcohol use disorder, diabetes, and GAD who presented with a two month history of anorexia, weight loss reported at 60lbs, malaise and intermittent abdominal pain. He was in his usual state of health until 2 months ago when he began to have weakness, nausea and vomiting. This had followed a month of heavy EtOH use at 1.5 pints of bourbon daily and cocaine use. He states that his status had progressively worsened until he felt the need to come to ED for evaluation due to the weakness primarily. He also states he appears to be hallucinating at times and endorsing intermittent 'strange coughs Type of Study: Bedside Swallow Evaluation Previous Swallow Assessment: none on file Diet Prior to this Study: Dysphagia 3 (soft);Thin liquids Temperature Spikes Noted: No Respiratory Status: Nasal cannula History of Recent Intubation: No Behavior/Cognition: Alert Oral Cavity Assessment: Other (comment)(some thicker clear secretions noted in posteriororal cavity ) Oral Care Completed by SLP: No Oral Cavity - Dentition: Adequate natural dentition Vision: Functional for self-feeding Self-Feeding Abilities: Able to feed self Patient Positioning: Upright in bed Baseline Vocal Quality: Normal Volitional Cough: Congested Volitional Swallow: Able to elicit    Oral/Motor/Sensory Function Overall Oral Motor/Sensory Function: Within functional limits   Ice Chips Ice chips: Not tested   Thin Liquid Thin Liquid: Within functional limits    Nectar Thick Nectar Thick Liquid: Not tested   Honey Thick Honey Thick Liquid: Not tested  Puree Puree: (pt declined despite enouragement)   Solid     Solid: (pt declined despite enouragement)      Dava Rensch E S[place] MA, CCC-SLP Acute Rehabilitation Services 07/15/2018,12:08 PM

## 2018-07-15 NOTE — Progress Notes (Addendum)
Subjective:  Ryan [name] is a 68 y.o.F  with PMH of alcholic cirrhosis, hep C and T2DM admit for unintentional weight loss on hospital day 2  Ryan [name] was examined and evaluated at bedside this AM. He states he has been having worsening cough since admission with subjective dyspnea. He denies any chest pain, palpitations, leg pain. He mentions some rectal pain exacerbated by his cough. Denies any pain with defecation, melena, BRBPR or pruritus. He states he feels a mass around his rectum. No other complaints.  Objective:  Vital signs in last 24 hours: Vitals:   07/14/18 2126 07/15/18 0100 07/15/18 0500 07/15/18 0529  BP: (!) 133/103 128/90  (!) 130/92  Pulse: (!) 110 (!) 107  97  Resp: '18 19  20  ' Temp: 97.6 F (36.4 C) 97.9 F (36.6 C)  98 F (36.7 C)  TempSrc: Oral Oral  Oral  SpO2: 99% 98%  99%  Weight:   69.5 kg   Height:       Gen: Well-developed, cachetic, NAD HEENT: NCAT head, hearing intact, EOMI, PERRL, no icteric sclerae Neck: supple, ROM intact CV: RRR, S1, S2 normal, No rubs, no murmurs, no gallops Pulm: Distant breath sounds, bibasilar rales Abd: Soft, BS+, NTND, No rebound, no guarding Extm: ROM intact, Peripheral pulses intact, No peripheral edema Skin: Dry, Warm, normal turgor GU: raised erythematous lesion peri-rectum w/o tenderness. Neuro: AAOx3, Cranial Nerve II-XII intact, DTR intact Psych: Normal mood and affect  Assessment/Plan:  Principal Problem:   Weight loss, unintentional Active Problems:   Acute kidney injury ([place])   History of alcohol use disorder   Proteinuria   Lactic acidosis   Cholestasis   Thrombocytopenia (HCC)   Alcoholic cirrhosis (HCC)   Hyperkalemia   Metabolic acidosis, normal anion gap (NAG)   Pleural effusion  Ryan [name] is a 68 yo M w/ PMH of alcoholic cirrhosis and I3HW admit for AKI and unintentional weight loss. While his AKI was initially thought to be due to dehydration, persistent reduction in  renal function despite fluid resuscitation makes progress of chronic kidney disease the more likely explanation. We will proceed with CT chest/abd/pelvis to looks for malignancy and provide supportive care.  Plan: AKI on CKD 2/2 progression of CKD (type 4 RTA) vs dehydration Speech and swallow eval confirm dysphagia requiring dysphagia 2 diet.Creatinine 2.3->2.17->2.23. Mild improvement with fluids from admission but does not appear to be changing significantly despite aggressive fluid resuscitation. Renal US: small simple cysts, no hydronephrosis. Aldosterone/renin ratio pending - F/u aldosterone/renin - Gentle fluid resuscitation w/ hx of cirrhosis: NS 100cc/hr for 10 hrs - Trend renal function - Avoid nephrotoxic meds  Unintentional Weight loss 2/2  malignancy vs poor oral intake Speech and swallow -> no significant dysphagia. CT chest/abd/pelvis on hold due to AKI but now presumed to be CKD. FOBT negative - CT chest/abd/pelvis - Zofran for nausea - Daily weights  Hx of Cirrhosis: per chart review, hx of hepatitis C infection (confirmed via biopsy) s/p treatment, steatosis, and alcoholic cirrhosis from 8616 [AST 148, ALT 102, Bili of 20.1]. No record since then. Admit AST 31, ALT 19, Alk phos 232, T.bili 1.5. Abd [place]: cholelithiasis w/o obstruction. Cirrhotic liver with trace ascites. Right lobe hemangioma and left lobe lesion. Hep C RNA pending - F/u CT Abd - F/u Hep C RNA - Trend hepatic function - If worsening liver function, start lactulose   EtOH use disorder: Drinks 1.5 pint bourbon daily but states last drink was 1.5 months ago. 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 - CIWA w/ ATIVAN - Folate, Thiamine, Vitamins  T2DM Glipizide but past chart review shows metformin on med list. Insulin naive. - Hgb a1c - Glucose checks - Add SSI if hyperglycemic  DVT prophx: Enoxaparin Diet: 2 gram sodium diet Bowel: Senokot PRN  Code: DNR  Dispo: Anticipated discharge in approximately 2-3 day(s).   [name], MD 07/15/2018, 6:32 AM Pager: [phone]

## 2018-07-16 ENCOUNTER — Inpatient Hospital Stay (HOSPITAL_COMMUNITY): Payer: Medicare Other

## 2018-07-16 ENCOUNTER — Encounter (HOSPITAL_COMMUNITY): Payer: Self-pay | Admitting: Interventional Radiology

## 2018-07-16 HISTORY — PX: IR THORACENTESIS ASP PLEURAL SPACE W/IMG GUIDE: IMG5380

## 2018-07-16 LAB — SAVE SMEAR(SSMR), FOR PROVIDER SLIDE REVIEW

## 2018-07-16 LAB — COMPREHENSIVE METABOLIC PANEL WITH GFR
ALT: 54 U/L — ABNORMAL HIGH (ref 0–44)
AST: 79 U/L — ABNORMAL HIGH (ref 15–41)
Albumin: 2.5 g/dL — ABNORMAL LOW (ref 3.5–5.0)
Alkaline Phosphatase: 437 U/L — ABNORMAL HIGH (ref 38–126)
Anion gap: 9 (ref 5–15)
BUN: 50 mg/dL — ABNORMAL HIGH (ref 8–23)
CO2: 18 mmol/L — ABNORMAL LOW (ref 22–32)
Calcium: 8.7 mg/dL — ABNORMAL LOW (ref 8.9–10.3)
Chloride: 113 mmol/L — ABNORMAL HIGH (ref 98–111)
Creatinine, Ser: 2.09 mg/dL — ABNORMAL HIGH (ref 0.61–1.24)
GFR calc Af Amer: 36 mL/min — ABNORMAL LOW
GFR calc non Af Amer: 31 mL/min — ABNORMAL LOW
Glucose, Bld: 110 mg/dL — ABNORMAL HIGH (ref 70–99)
Potassium: 4.3 mmol/L (ref 3.5–5.1)
Sodium: 140 mmol/L (ref 135–145)
Total Bilirubin: 1.1 mg/dL (ref 0.3–1.2)
Total Protein: 6.6 g/dL (ref 6.5–8.1)

## 2018-07-16 LAB — CBC
HCT: 49.5 % (ref 39.0–52.0)
Hemoglobin: 16.3 g/dL (ref 13.0–17.0)
MCH: 30.2 pg (ref 26.0–34.0)
MCHC: 32.9 g/dL (ref 30.0–36.0)
MCV: 91.7 fL (ref 80.0–100.0)
Platelets: 105 10*3/uL — ABNORMAL LOW (ref 150–400)
RBC: 5.4 MIL/uL (ref 4.22–5.81)
RDW: 13.9 % (ref 11.5–15.5)
WBC: 7.4 10*3/uL (ref 4.0–10.5)
nRBC: 0 % (ref 0.0–0.2)

## 2018-07-16 LAB — GLUCOSE, PLEURAL OR PERITONEAL FLUID: Glucose, Fluid: 178 mg/dL

## 2018-07-16 LAB — GRAM STAIN

## 2018-07-16 LAB — GLUCOSE, CAPILLARY
Glucose-Capillary: 101 mg/dL — ABNORMAL HIGH (ref 70–99)
Glucose-Capillary: 263 mg/dL — ABNORMAL HIGH (ref 70–99)
Glucose-Capillary: 136 mg/dL — ABNORMAL HIGH (ref 70–99)
Glucose-Capillary: 152 mg/dL — ABNORMAL HIGH (ref 70–99)

## 2018-07-16 LAB — LACTATE DEHYDROGENASE, PLEURAL OR PERITONEAL FLUID: LD, Fluid: 123 U/L — ABNORMAL HIGH (ref 3–23)

## 2018-07-16 LAB — BODY FLUID CELL COUNT WITH DIFFERENTIAL
Lymphs, Fluid: 38 %
Monocyte-Macrophage-Serous Fluid: 4 % — ABNORMAL LOW (ref 50–90)
Neutrophil Count, Fluid: 58 % — ABNORMAL HIGH (ref 0–25)
Total Nucleated Cell Count, Fluid: 780 cu mm (ref 0–1000)

## 2018-07-16 LAB — LACTATE DEHYDROGENASE: LDH: 286 U/L — ABNORMAL HIGH (ref 98–192)

## 2018-07-16 LAB — PROTEIN, PLEURAL OR PERITONEAL FLUID: Total protein, fluid: <3 g/dL

## 2018-07-16 MED ORDER — LIDOCAINE HCL 1 % IJ SOLN
INTRAMUSCULAR | Status: AC
  Filled 2018-07-16: qty 20

## 2018-07-16 NOTE — Progress Notes (Signed)
To radiology for thoracentesis.

## 2018-07-16 NOTE — Progress Notes (Signed)
Patient returned to room. Small adhesive bandage to right upper back.Remains tachypneic with minimal exertion.

## 2018-07-16 NOTE — Progress Notes (Signed)
Subjective:  Ryan [name] is a 68 y.o. with PMH of alcholic cirrhosis, hep C and T2DM admit for unintentional weight loss on hospital day 3  Ryan [name] still endorses cough without significant shortness of breath. Mentions improvement in his rectal pain. He denies chest pain, shortness of breath. Also endorses dry mouth without dysphagia or odynophagia. We explained to him the indications why we are performing the thoracentesis today and he expressed understanding.   Objective:  Vital signs in last 24 hours: Vitals:   07/15/18 1414 07/15/18 2108 07/16/18 0306 07/16/18 0414  BP: 121/88 107/90  114/83  Pulse: (!) 116 100  (!) 108  Resp: '20 17  19  ' Temp: (!) 97.5 F (36.4 C) 97.7 F (36.5 C)  98 F (36.7 C)  TempSrc: Axillary Oral  Axillary  SpO2: 100% 100%  100%  Weight:   69.8 kg   Height:       Physical Exam  Constitutional: No distress.  Chronically ill-appearing  HENT:  Mouth/Throat: Oropharynx is clear and moist.  Eyes: Conjunctivae are normal. No scleral icterus.  Neck: Normal range of motion. Neck supple. No JVD present.  Cardiovascular: Normal rate, regular rhythm, normal heart sounds and intact distal pulses.  No murmur heard. Pulmonary/Chest: Effort normal. He has no wheezes. He has no rales.  Diminished breath sounds on right  Abdominal: Soft. Bowel sounds are normal. He exhibits no distension. There is no abdominal tenderness.  Musculoskeletal: Normal range of motion.        General: Edema (trace bilateral pitting edema) present.  Skin: He is not diaphoretic.   Assessment/Plan:  Principal Problem:   Weight loss, unintentional Active Problems:   Acute kidney injury ([place])   History of alcohol use disorder   Proteinuria   Lactic acidosis   Cholestasis   Thrombocytopenia (HCC)   Alcoholic cirrhosis (HCC)   Hyperkalemia   Metabolic acidosis, normal anion gap (NAG)   Pleural effusion  Ryan [name] is a 68 yo M w/ PMH of alcoholic cirrhosis  and X4DH admit for AKI and unintentional weight loss. CT chest/abd/pelvis revealed significant right sided pleural effusion concerning for hepathorax vs malignancy. Will need thoracentesis to further evaluate. Worsening liver function but vitals stable. Endorsing better oral intake with dysphagia 2 diet. Will stop IV fluid resuscitation as he is starting to show evidence of hypervolemia and may be having hepatic congestion as well.  Plan: AKI on CKD 2/2 progression of CKD (type 4 RTA) vs dehydration Speech and swallow eval confirm dysphagia requiring dysphagia 2 diet.Creatinine 2.3->2.17->2.23->2.09. Declined renal function appears to be more chronic rather than acute. Continue to trend. Renal US: small simple cysts, no hydronephrosis. Aldosterone/renin ratio pending - Stop IV fluid resuscitation  - F/u aldosterone/renin - Trend renal function - Avoid nephrotoxic meds  UnintentionalWeight loss 2/2  malignancy vs poor oral intake Speech and swallow -> no significant dysphagia. CT chest/abd/pelvis w/ large R sided pleural effusion concerning for malignancy vs hepatothorax. Also with significant nephrotic proteinuria, will screen for multiple myeloma - Thoracentesis + pleural fluid cytology/ldh/protein - SPEP - Zofran for nausea - Daily weights  Hx of Cirrhosis: per chart review, hx of hepatitis C infection (confirmed via biopsy) s/p treatment, steatosis, and alcoholic cirrhosis from 6861 [AST 148, ALT 102, Bili of 20.1]. No record since then. AST 3 ALT 19, Alk phos 232, T.bili 1.5. Abd [place]: cholelithiasis w/o obstruction. Cirrhotic liver with trace ascites. Right lobe hemangioma and left lobe lesion. Hep C RNA pending - F/u Hep C  RNA - Trend hepatic function - If worsening liver function, start lactulose  EtOH use disorder: Drinks 1.5 pint bourbon daily but states last drink was 1.5 months ago. 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 -  CIWA w/ ATIVAN - Folate, Thiamine, Vitamins  DVT prophx:Enoxaparin Diet:2 gram sodium diet Bowel:Senokot PRN Code:DNR  Dispo: Anticipated discharge in approximately 2-3 day(s).   [name], MD 07/16/2018, 7:08 AM Pager: [phone]

## 2018-07-16 NOTE — TOC Transition Note (Signed)
Transition of Care [facility]) - CM/SW Discharge Note   Patient Details  Name: Ryan [name] MRN: [id number] Date of Birth: Jan 18, 1951  [facility] CM/SW Contact:  Bethena Roys, RN Phone Number: 07/16/2018, 3:42 PM   Clinical Narrative:   Pt presented for weakness and weight loss. S/p thoracentesis 07-16-18. PTA from home alone. Pt has PCP at the [facility], Social Worker is Coco @ [phone] ext [phone]. Pager [phone]. CM did call the [facility] to make the Fees coordinator aware that the patient has been hospitalized. Pt usually gets medications from the [facility]. CM will continue to monitor for additional transition of care needs.     Final next level of care: [place] Barriers to Discharge: Continued Medical Work up   Patient Goals and CMS Choice Patient states their goals for this hospitalization and ongoing recovery are:: "to feel better" CMS Medicare.gov Compare Post Acute Care list provided to:: Patient Choice offered to / list presented to : Patient   Discharge Plan and Services In-house Referral: NA Discharge Planning Services: CM Consult Post Acute Care Choice: Home Health          DME Arranged: N/A DME Agency: NA       HH Arranged: RN, Disease Management, PT, OT HH Agency: [facility] Date [facility] Agency Contacted: 07/16/18 Time [place]: 5859 Representative spoke with at [place]: [place] (SDOH) Interventions     Readmission Risk Interventions No flowsheet data found.

## 2018-07-16 NOTE — Progress Notes (Signed)
Physical Therapy Treatment Patient Details Name: Ryan [name] MRN: [id number] DOB: 05-Jul-1950 Today's Date: 07/16/2018    History of Present Illness 68 yo male w/ a PMHx notable for alcohol use disorder, diabetes, and GAD who presented with a two month history of anorexia, weight loss reported at 60lbs, malaise and intermittent abdominal pain.    PT Comments    Pt sleeping on arrival and instructed it was time to get OOB.  He had refused multiple times from OT earlier in the day but responds better when not given a choice to stay in bed but that is was time to get out of bed and have dinner in the recliner.  Pt pleasant and followed all commands.  Pt with minor LOB in standing initially during coming to stand.  Required min assistance to correct.  Continue to recommend HHPT and RW at d/c.   Follow Up Recommendations  Home health PT     Equipment Recommendations  Rolling walker with 5" wheels    Recommendations for Other Services       Precautions / Restrictions Precautions Precautions: Fall Restrictions Weight Bearing Restrictions: No    Mobility  Bed Mobility Overal bed mobility: Needs Assistance Bed Mobility: Supine to Sit     Supine to sit: Supervision     General bed mobility comments: Supervision for safety; no difficulty noted  Transfers Overall transfer level: Needs assistance Equipment used: Rolling walker (2 wheeled) Transfers: Sit to/from Stand Sit to Stand: Min guard;Min assist         General transfer comment: Pt pulling on RW to achieve standing.  Minor LOB when in standing to gain balance min assistance to correct.    Ambulation/Gait Ambulation/Gait assistance: Min assist Gait Distance (Feet): 12 Feet Assistive device: Rolling walker (2 wheeled) Gait Pattern/deviations: Step-through pattern;Trunk flexed     General Gait Details: PTA moved chair across room to progress gait with commands of simple task.  Pt required cues for upper trunk  control and safety with RW.     Stairs             Wheelchair Mobility    Modified Rankin (Stroke Patients Only)       Balance Overall balance assessment: Needs assistance Sitting-balance support: Bilateral upper extremity supported;Feet supported Sitting balance-Leahy Scale: Fair       Standing balance-Leahy Scale: Poor Standing balance comment: rw for support in standing                            Cognition Arousal/Alertness: Awake/alert Behavior During Therapy: WFL for tasks assessed/performed Overall Cognitive Status: Within Functional Limits for tasks assessed                                 General Comments: Pt required max cues for encouragement to move to edge of bed and OOB to recliner chair.        Exercises      General Comments        Pertinent Vitals/Pain Pain Assessment: No/denies pain Faces Pain Scale: Hurts a little bit Pain Location: grimacing with bed mobility, reports R side of abdomen Pain Intervention(s): Monitored during session;Repositioned    Home Living                      Prior Function            PT  Goals (current goals can now be found in the care plan section) Acute Rehab PT Goals Patient Stated Goal: Agreeable to getting OOB Potential to Achieve Goals: Good Progress towards PT goals: Progressing toward goals    Frequency    Min 3X/week      PT Plan Current plan remains appropriate    Co-evaluation              AM-PAC PT "6 Clicks" Mobility   Outcome Measure  Help needed turning from your back to your side while in a flat bed without using bedrails?: None Help needed moving from lying on your back to sitting on the side of a flat bed without using bedrails?: None Help needed moving to and from a bed to a chair (including a wheelchair)?: A Little Help needed standing up from a chair using your arms (e.g., wheelchair or bedside chair)?: A Little Help needed to walk in  hospital room?: A Little Help needed climbing 3-5 steps with a railing? : A Little 6 Click Score: 20    End of Session Equipment Utilized During Treatment: Gait belt Activity Tolerance: Patient tolerated treatment well Patient left: in chair;with call bell/phone within reach;with chair alarm set Nurse Communication: Mobility status PT Visit Diagnosis: Unsteadiness on feet (R26.81);Other abnormalities of gait and mobility (R26.89)     Time: 2707-8675 PT Time Calculation (min) (ACUTE ONLY): 14 min  Charges:  $Therapeutic Activity: 8-22 mins                     [name], PTA Acute Rehabilitation Services Pager [phone] Office [phone]     [name] 07/16/2018, 5:28 PM

## 2018-07-16 NOTE — Progress Notes (Signed)
OT Cancellation Note  Patient Details Name: Ryan [name] MRN: [id number] DOB: Jun 20, 1950   Cancelled Treatment:    Reason Eval/Treat Not Completed: Patient declined, no reason specified(Pt complains of fatigue and declines after several attempts.) Therapist attempted several times to work with patient. Therabands provided to address strength for ADLs. OT will follow up.   Hoy Morn, [place]  [phone] 07/16/2018, 2:53 PM

## 2018-07-16 NOTE — Progress Notes (Signed)
  Date: 07/16/2018  Patient name: Demitri Kucinski  Medical record number: [id number]  Date of birth: July 29, 1950   I have seen and evaluated this patient and I have discussed the plan of care with the house staff. Please see their note for complete details. I concur with their findings with the following additions/corrections:   Stable on exam this morning. Reports eating well, but only 10-50% of meals recorded as consumed. Weight up slightly from yesterday. He says it feels "dry" when he swallows, but denies pain or food getting stuck.   Went for IR thoracentesis this morning. Does not meet Light's criteria for exudate, awaiting cytology. Will ask additional questions about asbestos exposure, as thoracentesis has poor sensitivity for mesothelioma.   Only significant findings on CT C/A/P were bilateral pleural effusions, but limited by lack of contrast.   Renal function has not improved, with no clear explanation and paraprotein gap, will check SPEP/IFE. Following renin/aldo to help evaluate for type 4 RTA.   Lenice Pressman, M.D., Ph.D. 07/16/2018, 2:46 PM

## 2018-07-16 NOTE — ED Provider Notes (Signed)
[facility]  SURGICAL Provider Note   CSN: [id number] Arrival date & time: 07/13/18  1017    History   Chief Complaint Chief Complaint  Patient presents with  . not eating    HPI Ryan [name] is a 68 y.o. male.     HPI Patient is a 68 year old male with a history of recurrent nausea vomiting and hiccups who presents the emergency department with nausea vomiting hiccups and poor appetite over the past several days.  He states every time he tries to eat something he develops nausea and vomiting.  He feels weak and tired.  He reports decreased oral intake.  He still makes urine but reports the urine is dark and concentrated.  Denies diarrhea.  Heart rate found to be 122 in triage.  He feels like he is lost approximately 60 to 70 pounds over the past 3 months.  He has had several hospitalizations and ER visits for this.  Is not clear if he has had endoscopy to this point.  The patient does not know.  Chart will be reviewed   History reviewed. No pertinent past medical history.  Patient Active Problem List   Diagnosis Date Noted  . Proteinuria 07/14/2018  . Lactic acidosis 07/14/2018  . Cholestasis 07/14/2018  . Thrombocytopenia ([place]) 07/14/2018  . Alcoholic cirrhosis ([place]) 88/41/6606  . Hyperkalemia 07/14/2018  . Metabolic acidosis, normal anion gap (NAG) 07/14/2018  . Pleural effusion 07/14/2018  . Acute kidney injury ([place]) 07/13/2018  . History of alcohol use disorder 07/13/2018  . Weight loss, unintentional 07/13/2018    History reviewed. No pertinent surgical history.      Home Medications    Prior to Admission medications   Medication Sig Start Date End Date Taking? Authorizing Provider  glipiZIDE (GLUCOTROL) 5 MG tablet Take 5 mg by mouth daily before breakfast.   Yes [provider]  hydrOXYzine (ATARAX/VISTARIL) 10 MG tablet Take 10 mg by mouth at bedtime as needed (sleep).   Yes [provider]    Family  History No family history on file.  Social History Social History   Tobacco Use  . Smoking status: Former Research scientist (life sciences)  . Smokeless tobacco: Never Used  Substance Use Topics  . Alcohol use: Not Currently    Frequency: Never  . Drug use: Never     Allergies   Ace inhibitors   Review of Systems Review of Systems  All other systems reviewed and are negative.    Physical Exam Updated Vital Signs BP 114/83 (BP Location: Right Arm)   Pulse (!) 108   Temp 98 F (36.7 C) (Axillary)   Resp 19   Ht 5\' 8"  (1.727 m)   Wt 69.8 kg   SpO2 100%   BMI 23.40 kg/m   Physical Exam Vitals signs and nursing note reviewed.  Constitutional:      Appearance: He is well-developed.  HENT:     Head: Normocephalic and atraumatic.  Neck:     Musculoskeletal: Normal range of motion.  Cardiovascular:     Rate and Rhythm: Normal rate and regular rhythm.     Heart sounds: Normal heart sounds.  Pulmonary:     Effort: Pulmonary effort is normal. No respiratory distress.     Breath sounds: Normal breath sounds.  Abdominal:     General: There is no distension.     Palpations: Abdomen is soft.     Tenderness: There is no abdominal tenderness.  Musculoskeletal: Normal range of motion.  Skin:    General: Skin is warm and dry.  Neurological:     Mental Status: He is alert and oriented to person, place, and time.  Psychiatric:        Judgment: Judgment normal.      ED Treatments / Results  Labs (all labs ordered are listed, but only abnormal results are displayed) Labs Reviewed  CBC WITH DIFFERENTIAL/PLATELET - Abnormal; Notable for the following components:      Result Value   HCT 52.2 (*)    Platelets 129 (*)    All other components within normal limits  COMPREHENSIVE METABOLIC PANEL - Abnormal; Notable for the following components:   Potassium 5.9 (*)    CO2 15 (*)    Glucose, Bld 198 (*)    BUN 54 (*)    Creatinine, Ser 2.36 (*)    Albumin 3.0 (*)    Alkaline Phosphatase 232  (*)    Total Bilirubin 1.5 (*)    GFR calc non Af Amer 27 (*)    GFR calc Af Amer 31 (*)    Anion gap 16 (*)    All other components within normal limits  URINALYSIS, ROUTINE W REFLEX MICROSCOPIC - Abnormal; Notable for the following components:   APPearance HAZY (*)    Glucose, UA 50 (*)    Hgb urine dipstick SMALL (*)    Ketones, ur 5 (*)    Protein, ur >=300 (*)    Bacteria, UA RARE (*)    All other components within normal limits  LIPASE, BLOOD - Abnormal; Notable for the following components:   Lipase 66 (*)    All other components within normal limits  PROTIME-INR - Abnormal; Notable for the following components:   Prothrombin Time 18.9 (*)    INR 1.6 (*)    All other components within normal limits  BASIC METABOLIC PANEL - Abnormal; Notable for the following components:   CO2 18 (*)    Glucose, Bld 175 (*)    BUN 53 (*)    Creatinine, Ser 2.30 (*)    GFR calc non Af Amer 28 (*)    GFR calc Af Amer 32 (*)    All other components within normal limits  CBC - Abnormal; Notable for the following components:   RBC 5.86 (*)    Hemoglobin 17.6 (*)    HCT 53.8 (*)    Platelets 103 (*)    All other components within normal limits  PROTIME-INR - Abnormal; Notable for the following components:   Prothrombin Time 19.9 (*)    INR 1.7 (*)    All other components within normal limits  BASIC METABOLIC PANEL - Abnormal; Notable for the following components:   Potassium 5.6 (*)    Chloride 114 (*)    CO2 14 (*)    Glucose, Bld 223 (*)    BUN 54 (*)    Creatinine, Ser 2.17 (*)    GFR calc non Af Amer 30 (*)    GFR calc Af Amer 35 (*)    All other components within normal limits  HEPATIC FUNCTION PANEL - Abnormal; Notable for the following components:   Albumin 2.9 (*)    Alkaline Phosphatase 272 (*)    Bilirubin, Direct 0.5 (*)    All other components within normal limits  HEMOGLOBIN A1C - Abnormal; Notable for the following components:   Hgb A1c MFr Bld 8.3 (*)    All  other components within normal limits  GLUCOSE, CAPILLARY - Abnormal;  Notable for the following components:   Glucose-Capillary 192 (*)    All other components within normal limits  LACTIC ACID, PLASMA - Abnormal; Notable for the following components:   Lactic Acid, Venous 2.9 (*)    All other components within normal limits  BETA-HYDROXYBUTYRIC ACID - Abnormal; Notable for the following components:   Beta-Hydroxybutyric Acid 0.50 (*)    All other components within normal limits  GAMMA GT - Abnormal; Notable for the following components:   GGT 258 (*)    All other components within normal limits  GLUCOSE, CAPILLARY - Abnormal; Notable for the following components:   Glucose-Capillary 174 (*)    All other components within normal limits  TSH - Abnormal; Notable for the following components:   TSH 7.750 (*)    All other components within normal limits  GLUCOSE, CAPILLARY - Abnormal; Notable for the following components:   Glucose-Capillary 167 (*)    All other components within normal limits  C-REACTIVE PROTEIN - Abnormal; Notable for the following components:   CRP 8.3 (*)    All other components within normal limits  PROTEIN / CREATININE RATIO, URINE - Abnormal; Notable for the following components:   Protein Creatinine Ratio 3.03 (*)    All other components within normal limits  LACTIC ACID, PLASMA - Abnormal; Notable for the following components:   Lactic Acid, Venous 2.7 (*)    All other components within normal limits  LACTIC ACID, PLASMA - Abnormal; Notable for the following components:   Lactic Acid, Venous 3.7 (*)    All other components within normal limits  T3 - Abnormal; Notable for the following components:   T3, Total 46 (*)    All other components within normal limits  BASIC METABOLIC PANEL - Abnormal; Notable for the following components:   Chloride 115 (*)    CO2 16 (*)    Glucose, Bld 125 (*)    BUN 52 (*)    Creatinine, Ser 2.23 (*)    Calcium 8.8 (*)     GFR calc non Af Amer 29 (*)    GFR calc Af Amer 34 (*)    All other components within normal limits  GLUCOSE, CAPILLARY - Abnormal; Notable for the following components:   Glucose-Capillary 113 (*)    All other components within normal limits  LACTIC ACID, PLASMA - Abnormal; Notable for the following components:   Lactic Acid, Venous 2.0 (*)    All other components within normal limits  LACTIC ACID, PLASMA - Abnormal; Notable for the following components:   Lactic Acid, Venous 2.1 (*)    All other components within normal limits  GLUCOSE, CAPILLARY - Abnormal; Notable for the following components:   Glucose-Capillary 120 (*)    All other components within normal limits  GLUCOSE, CAPILLARY - Abnormal; Notable for the following components:   Glucose-Capillary 207 (*)    All other components within normal limits  LACTIC ACID, PLASMA - Abnormal; Notable for the following components:   Lactic Acid, Venous 2.2 (*)    All other components within normal limits  GLUCOSE, CAPILLARY - Abnormal; Notable for the following components:   Glucose-Capillary 174 (*)    All other components within normal limits  GLUCOSE, CAPILLARY - Abnormal; Notable for the following components:   Glucose-Capillary 68 (*)    All other components within normal limits  LACTATE DEHYDROGENASE - Abnormal; Notable for the following components:   LDH 286 (*)    All other components within normal limits  COMPREHENSIVE METABOLIC PANEL - Abnormal; Notable for the following components:   Chloride 113 (*)    CO2 18 (*)    Glucose, Bld 110 (*)    BUN 50 (*)    Creatinine, Ser 2.09 (*)    Calcium 8.7 (*)    Albumin 2.5 (*)    AST 79 (*)    ALT 54 (*)    Alkaline Phosphatase 437 (*)    GFR calc non Af Amer 31 (*)    GFR calc Af Amer 36 (*)    All other components within normal limits  SARS CORONAVIRUS 2 (HOSPITAL ORDER, [place] [facility])  HIV ANTIBODY (ROUTINE TESTING W REFLEX)  OCCULT BLOOD X 1  CARD TO LAB, STOOL  PHOSPHORUS  SEDIMENTATION RATE  T4, FREE  GLUCOSE, CAPILLARY  GLUCOSE, CAPILLARY  HCV RNA QUANT  ALDOSTERONE + RENIN ACTIVITY W/ RATIO  CBC    EKG None  Radiology   Procedures Procedures (including critical care time)  Medications Ordered in ED Medications  enoxaparin (LOVENOX) injection 30 mg (30 mg Subcutaneous Given 07/15/18 2056)  acetaminophen (TYLENOL) tablet 650 mg (has no administration in time range)    Or  acetaminophen (TYLENOL) suppository 650 mg (has no administration in time range)  senna-docusate (Senokot-S) tablet 1 tablet (has no administration in time range)  ondansetron (ZOFRAN) tablet 4 mg (has no administration in time range)    Or  ondansetron (ZOFRAN) injection 4 mg (has no administration in time range)  LORazepam (ATIVAN) tablet 1 mg (1 mg Oral Given 07/14/18 0112)    Or  LORazepam (ATIVAN) injection 1 mg ( Intravenous See Alternative 10/13/07 3267)  folic acid (FOLVITE) tablet 1 mg (1 mg Oral Given 07/15/18 0915)  multivitamin with minerals tablet 1 tablet (1 tablet Oral Given 07/15/18 0915)  insulin aspart (novoLOG) injection 0-5 Units (0 Units Subcutaneous Not Given 07/15/18 2108)  0.9 %  sodium chloride infusion ( Intravenous New Bag/Given 07/14/18 0115)  insulin aspart (novoLOG) injection 0-15 Units (0 Units Subcutaneous Not Given 07/15/18 1752)  thiamine (VITAMIN B-1) tablet 100 mg (100 mg Oral Given 07/15/18 0915)  feeding supplement (NEPRO CARB STEADY) liquid 237 mL (237 mLs Oral Not Given 07/15/18 1409)  insulin glargine (LANTUS) injection 10 Units (10 Units Subcutaneous Given 07/15/18 2108)  benzonatate (TESSALON) capsule 100 mg (100 mg Oral Given 07/15/18 2048)  hydrocortisone (ANUSOL-HC) 2.5 % rectal cream ( Rectal Given 07/15/18 2056)  sodium chloride 0.9 % bolus 1,000 mL (1,000 mLs Intravenous New Bag/Given 07/13/18 1144)  ondansetron (ZOFRAN) injection 4 mg (4 mg Intravenous Given 07/13/18 1144)  sodium polystyrene (KAYEXALATE) 15 GM/60ML  suspension 30 g (30 g Oral Given 07/14/18 2119)     Initial Impression / Assessment and Plan / ED Course  I have reviewed the triage vital signs and the nursing notes.  Pertinent labs & imaging results that were available during my care of the patient were reviewed by me and considered in my medical decision making (see chart for details).        Dehydration and acute renal failure found.  Patient will be admitted.  IV fluids now.  Repeat abdominal exam without focal tenderness.  No emergent indication for advanced imaging.  Will likely benefit from GI consultation and endoscopy if not already completed as well as possible advanced imaging from a cancer standpoint.  Patient will be admitted to the hospital.  Case discussed with the teaching service who will further evaluate the patient in the ER and  admit  Final Clinical Impressions(s) / ED Diagnoses   Final diagnoses:  Acute renal failure, unspecified acute renal failure type ([place])  Dehydration  Acute renal injury ([place])  Hepatomegaly  Pleural effusion on right  Pleural effusion    ED Discharge Orders    None       Jola Schmidt, MD 07/16/18 [phone]

## 2018-07-17 DIAGNOSIS — R0600 Dyspnea, unspecified: Secondary | ICD-10-CM

## 2018-07-17 DIAGNOSIS — I129 Hypertensive chronic kidney disease with stage 1 through stage 4 chronic kidney disease, or unspecified chronic kidney disease: Secondary | ICD-10-CM

## 2018-07-17 DIAGNOSIS — J811 Chronic pulmonary edema: Secondary | ICD-10-CM

## 2018-07-17 LAB — PROTEIN ELECTROPHORESIS, SERUM
A/G Ratio: 0.6 — ABNORMAL LOW (ref 0.7–1.7)
Albumin ELP: 2.6 g/dL — ABNORMAL LOW (ref 2.9–4.4)
Alpha-1-Globulin: 0.4 g/dL (ref 0.0–0.4)
Alpha-2-Globulin: 1 g/dL (ref 0.4–1.0)
Beta Globulin: 1.3 g/dL (ref 0.7–1.3)
Gamma Globulin: 1.9 g/dL — ABNORMAL HIGH (ref 0.4–1.8)
Globulin, Total: 4.5 g/dL — ABNORMAL HIGH (ref 2.2–3.9)
Total Protein ELP: 7.1 g/dL (ref 6.0–8.5)

## 2018-07-17 LAB — COMPREHENSIVE METABOLIC PANEL
ALT: 49 U/L — ABNORMAL HIGH (ref 0–44)
AST: 63 U/L — ABNORMAL HIGH (ref 15–41)
Albumin: 2.4 g/dL — ABNORMAL LOW (ref 3.5–5.0)
Alkaline Phosphatase: 418 U/L — ABNORMAL HIGH (ref 38–126)
Anion gap: 9 (ref 5–15)
BUN: 48 mg/dL — ABNORMAL HIGH (ref 8–23)
CO2: 18 mmol/L — ABNORMAL LOW (ref 22–32)
Calcium: 8.8 mg/dL — ABNORMAL LOW (ref 8.9–10.3)
Chloride: 112 mmol/L — ABNORMAL HIGH (ref 98–111)
Creatinine, Ser: 1.91 mg/dL — ABNORMAL HIGH (ref 0.61–1.24)
GFR calc Af Amer: 41 mL/min — ABNORMAL LOW (ref >60)
GFR calc non Af Amer: 35 mL/min — ABNORMAL LOW (ref >60)
Glucose, Bld: 171 mg/dL — ABNORMAL HIGH (ref 70–99)
Potassium: 4.5 mmol/L (ref 3.5–5.1)
Sodium: 139 mmol/L (ref 135–145)
Total Bilirubin: 1 mg/dL (ref 0.3–1.2)
Total Protein: 6.9 g/dL (ref 6.5–8.1)

## 2018-07-17 LAB — GLUCOSE, CAPILLARY
Glucose-Capillary: 181 mg/dL — ABNORMAL HIGH (ref 70–99)
Glucose-Capillary: 154 mg/dL — ABNORMAL HIGH (ref 70–99)
Glucose-Capillary: 108 mg/dL — ABNORMAL HIGH (ref 70–99)
Glucose-Capillary: 144 mg/dL — ABNORMAL HIGH (ref 70–99)

## 2018-07-17 MED ORDER — FUROSEMIDE 10 MG/ML IJ SOLN
INTRAMUSCULAR | Status: AC
  Filled 2018-07-17: qty 2

## 2018-07-17 MED ORDER — FUROSEMIDE 10 MG/ML IJ SOLN
20.0000 mg | Freq: Once | INTRAMUSCULAR | Status: AC
  Administered 2018-07-17: 20 mg via INTRAVENOUS

## 2018-07-17 MED ORDER — BUPROPION HCL ER (XL) 150 MG PO TB24
150.0000 mg | ORAL_TABLET | Freq: Every day | ORAL | Status: DC
  Administered 2018-07-17 – 2018-08-01 (×16): 150 mg via ORAL
  Filled 2018-07-17 (×16): qty 1

## 2018-07-17 NOTE — Progress Notes (Signed)
   07/17/18 1700  Clinical Encounter Type  Visited With Patient;Health care provider  Visit Type Initial  Referral From Physician   Responded to chaplain referral from physician.  Attempted visit, pt was lying down in bed and said another time would be preferable.  Will attempt to f/u tomorrow.  Updated care RN.  Myra Gianotti resident, [phone]

## 2018-07-17 NOTE — Plan of Care (Signed)
  Problem: Activity: Goal: Risk for activity intolerance will decrease Outcome: Progressing   Problem: Coping: Goal: Level of anxiety will decrease Outcome: Progressing   Problem: Pain Managment: Goal: General experience of comfort will improve Outcome: Progressing   

## 2018-07-17 NOTE — Progress Notes (Addendum)
Subjective:  Ryan [name] is a 68 y.o. M with PMH of alcoholic cirrhosis, O1YY, HTn admit for unexplained weight loss on hospital day 4  Ryan [name] was examined at bedside and reports that he feels about the same. His only complaint is pain at the site of the thoracentesis. He states he wanted to tell us of a medication that he was prescribed at discharge from his prior hospitalization that aided with his appetitie. He is unable to recall the name but states he was eating well with it. Discussed the importance of establishing care with a PCP and hepatologist. He mentions that he has a gastroenterologist at the [facility] and is confident in arranging follow up himself. When asked about possible exposure to asbestos, he states that he has worked in Architect and shipbuilding in the past for about 6 months. He was in the [employer] and stationed in [place]. He was re-instated again in [place] in the '70s and did "Labor work" afterwards. 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 He states he continues to endorse shortness of breath with exertion. Denies any prior hx of pulmonary disease.   Also discussed about his mood for which he discussed feeling sad, weak, with difficulty concentrating and feeling alone at home. Discussed option of starting anti-depressants and therapy which he was agreeable to both.  Objective:  Vital signs in last 24 hours: Vitals:   07/16/18 0414 07/16/18 1228 07/16/18 2043 07/17/18 0409  BP: 114/83 (!) 138/107 (!) 128/92 124/90  Pulse: (!) 108 (!) 118 (!) 107 (!) 109  Resp: 19 (!) '22 18 18  ' Temp: 98 F (36.7 C) 98.2 F (36.8 C) 97.7 F (36.5 C) 97.6 F (36.4 C)  TempSrc: Axillary Oral Oral Oral  SpO2: 100% 100% 100% 93%  Weight:    70 kg  Height:       Gen: Chronically ill-appearing, NAD HEENT: temporal wasting Neck: supple, ROM intact, no JVD, no cervical adenopathy CV: RRR, S1, S2 normal, No rubs, no murmurs, no gallops Pulm: CTAB, bibasilar rales Abd:  Soft, BS+, NTND, No rebound, no guarding Extm: ROM intact, Peripheral pulses intact, Lower extremities pitting edema resolved Skin: Dry, Warm  Assessment/Plan:  Principal Problem:   Weight loss, unintentional Active Problems:   Acute kidney injury (HCC)   History of alcohol use disorder   Proteinuria   Lactic acidosis   Cholestasis   Thrombocytopenia (HCC)   Alcoholic cirrhosis (HCC)   Hyperkalemia   Metabolic acidosis, normal anion gap (NAG)   Pleural effusion  Ryan [name] is a 68 yo M w/ PMH of alcoholic cirrhosis and Q8GN admit for AKI and unintentional weight loss.On chart review, his weight has been slowly increasing while eating well on hospital food. Although he is high risk of hepatic, pancreatic, lung malignancies, perhaps his weight loss was more due to poor oral intake with mood disorder. Will continue to f/u oncology work-up while starting anti-depressant therapy. He continues to endorse dyspnea despite significant fluid reduction after thoracentesis. Will attempt to diurese with furosemide and reassess oxygen requirement.  UnintentionalWeight loss2/2 poor oral intake vs depression vs malignancy  69.5kg -> 70kg since admission. Poor oral intake due to depression higher on differential since improvement with regular hospital food. PHQ-9 this am at 19 - moderate-severe depression. CT chest/abd/pelvis w/ large R sided pleural effusion concerning for malignancy vs hepatothorax. Thoracentesis performed yesterday w/ 1.2 transudate effusion (protein <3, LDH 123). Cytology pending.  - Start Wellbutrin 132m daily for depression & alcohol craving -  F/u SPEP, pleural fluid cytology - Zofran for nausea - Daily weights  Dyspnea 2/2 pulmonary edema vs underlying lung disease. 1.2L out from right lung yesterday. This AM 93% on 3L . On exam, bibasilar rales. Post-thoracentesis chest X-ray w/ small left sided pleural effusion. 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 Declined renal function appears to be more chronic rather than acute. Continue to trend. Renal [place] small simple cysts, no hydronephrosis. Aldosterone/renin ratio pending - F/u aldosterone/renin - Trend renal function - Avoid nephrotoxic meds  Hx of Cirrhosis: per chart review, hx of hepatitis C infection (confirmed via biopsy) s/p treatment, steatosis, and alcoholic cirrhosis from 28337[AST 148, ALT 102, Bili of 20.1]. No record since then. AST 3 ALT 19, Alk phos 232, T.bili 1.5.Abd [place] cholelithiasis w/o obstruction. Cirrhotic liver with trace ascites. Right lobe hemangioma and left lobe lesion. Hep C RNA pending -F/uHep C RNA - Trend hepatic function - If worsening liver function, start lactulose  EtOH use disorder: Drinks 1.5 pint bourbon daily but states last drink was 1.5 months ago. 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 - CIWA w/ ATIVAN - Folate, Thiamine, Vitamins  DVT prophx:Enoxaparin Diet:2 gram sodium diet Bowel:Senokot PRN Code:DNR  Dispo: Anticipated discharge in approximately 0-1 day(s).   LMosetta Anis MD 07/17/2018, 6:37 AM Pager: [phone]

## 2018-07-17 NOTE — Discharge Summary (Signed)
Name: Ryan [name] MRN: [id number] DOB: Jul 24, 1950 68 y.o. PCP: Patient, No Pcp Per  Date of Admission: 07/13/2018 10:29 AM Date of Discharge: 08/01/2018 Attending Physician: Axel Filler, *  Discharge Diagnosis: 1. Low Output Biventricular Heart Failure 2. Left Ventricular Thrombus 3. Right Lower Extremity Deep Vein Thrombus 4. Acute Kidney Injury on Chronic Kidney Disease 5. Alcoholic Cirrhosis 6. Type 2 Diabetes Mellitus 7. Alcohol Use Disorder  Discharge Medications: Allergies as of 08/01/2018      Reactions   Ace Inhibitors Cough      Medication List    STOP taking these medications   glipiZIDE 5 MG tablet Commonly known as: GLUCOTROL   hydrOXYzine 10 MG tablet Commonly known as: ATARAX/VISTARIL     TAKE these medications   buPROPion 150 MG 24 hr tablet Commonly known as: WELLBUTRIN XL Take 1 tablet (150 mg total) by mouth daily. Start taking on: August 02, 2018   hydrALAZINE 25 MG tablet Commonly known as: APRESOLINE Take 0.5 tablets (12.5 mg total) by mouth 2 times daily at 12 noon and 4 pm.   isosorbide mononitrate 30 MG 24 hr tablet Commonly known as: IMDUR Take 0.5 tablets (15 mg total) by mouth at bedtime.   ivabradine 5 MG Tabs tablet Commonly known as: CORLANOR Take 1 tablet (5 mg total) by mouth 2 (two) times daily with a meal.   metFORMIN 500 MG tablet Commonly known as: Glucophage Take 1 tablet (500 mg total) by mouth 2 (two) times daily with a meal.   torsemide 20 MG tablet Commonly known as: DEMADEX Take 2 tablets (40 mg total) by mouth daily. Start taking on: August 02, 2018   warfarin 5 MG tablet Commonly known as: COUMADIN Take 1 tablet (5 mg total) by mouth daily at 6 PM.            Durable Medical Equipment  (From admission, onward)         Start     Ordered   08/01/18 1144  For home use only DME Walker rolling  [facility])  Once    Comments: 5" wheels  Question:  Patient needs a walker to treat with the  following condition  Answer:  Cirrhosis of liver with ascites ([place])   08/01/18 1146   08/01/18 1144  DME 3-in-1  Once    Comments: 3-in-1 commode   08/01/18 1147         Disposition and follow-up:   [name] was discharged from [facility] in [place] condition.  At the hospital follow up visit please address:  1. Low Output Biventricular Heart Failure: - Found to have EF of 10-15% with biventricular systolic dysfunction and severely dilated bilateral atria - Discharged (discharge weight 58.2kg) on torsemide 41m, ivabradine 554m hydralazine 2554mnd Imdur 20m19mEnsure he is taking his home medications as prescribed - Assess volume status and titrate diuretic dose as needed - Check BMP for renal function and potassium - Continue to encourage alcohol cessation and counsel on low sodium intake  2. Left Ventricular Thrombus / Right Lower Extremity Deep Vein Thrombus: - Discharged on Warfarin - Assess for evidence of bleeding events - Please check INR and ensure proper dosing - Ensure he follows up with heart failure clinic - Will require at least 3 months of anticoagulation and re-evaluation at that time  4. Acute Kidney Injury on Chronic Kidney Disease: - At discharge, Bun 43, Creatinine 2.62, GFR of 28% likely a new baseline - Please check BMP  to screen for worsening renal function - Consider adjusting diuretic dose as appropriate  5. Alcoholic Cirrhosis: - Found to have cirrhosis and MELD of 19 - Not a candidate for aggressive measures - Continue to encourage sobriety from alcohol use  6. Type 2 Diabetes Mellitus (hgb a1c 8.3): - Glipizide discontinued, started on metformin at discharge - Titrate diabetic regimen as indicated - Consider starting SGLT-2 inhibitor in setting of HFrEF  7. Alcohol Use Disorder: - Started on buproprion during hospitalization for mood and cravings - Continue to encourage sobriety from alcohol use  2.  Labs / imaging  needed at time of follow-up: CBC, BMP, INR  3.  Pending labs/ test needing follow-up: N/A  Follow-up Appointments: [place], [facility] [place] [facility] Follow up.   Specialty: Home Health Services Why: Registered Nurse, Physical and Occupational Therapy- office to call and schedule time to visit.  Contact information: [place] [address] [place]. Go on 08/09/2018.   Specialty: Cardiology Why: 10:30 AM, parking code [employer] information: [address] [id number] [place] [phone] [place]. Go on 08/08/2018.   Why: 1:45 PM Contact information: [address] [place] [facility] Course by problem list: 1. Low Output Biventricular Heart Failure: [name] is a 68 yo F w/ PMH of alcoholic cirrhosis, alcohol use disorder and diabetes presenting to [facility] with complaints of weakness, fatigue and unexplained weight loss. On initial presentation was worked up for endocrine, possible malignancy with CT chest, abdomen, pelvis, TSH and SPEP. He was also noted to have gap metabolic acidosis with lactate of 2.9 and was started on IV fluid resuscitation. TSH was found to be elevated but free T4 was found to be within normal range. SPEP did not reveal any evidence of MGUS. CT abdomen showed large right pleural effusion as well as cirrhosis. Thoracentesis was performed showing large volume effusion with transudative effusion. He was also noted to start developing significant lower extremity edema. Fluids were stopped and TTE was performed showing EF of 15% with biventricular systolic dysfunction as well as a left ventricular apical thrombus. Repeat focused Echo with contrast confirmed the findings and he was started on IV heparin. Heart Failure team was consulted  on hospital day 6. He had a PICC line placed and was started on milironone gtt. His milironone was titrated up to Co-ox of 74%. Bidil and Iv furosemide was also started. He was deemed not a candidate for advanced therapies or further invasive diagnostic testing due to renal failure. His symptoms of weakness, dyspnea and fatigue improved on milrinone and after 7 days of milironone with mild improvement in renal function. His medication regimen was optimized with addition of ivabradine and Bidil. His milirinone infusion rate was titrated down gradually until he came off the drip completely on hospital day 17. He was observed for additional 48 hours with episodes of mild hypotension, for which Bidil was discontinued, but without significant decompensation and was discharged home on torsemide, ivabradine, hydralazine and Imdur with recommendation for close follow up with heart failure clinic. At discharge he had no supplemental oxygen requirement and his weight was 58.2kg.  2. Left Ventricular Thrombus: Found incidentally on transthoracic echocardiogram. Confirmed with repeat echo with contrast. Started on heparin  infusion on hospital day 6. Prior to discharge, he transitioned to warfarin. Discharged at warfarin 56m with INR of 2.6 with recommendation to follow up with PCP.  3. Right Lower Extremity Deep Vein Thrombus: Noted to have right lower extremity swelling with initial diagnosis of lower extremity edema due to cirrhosis, lower extremity doppler revealed deep vein thrombus and he was started on heparin infusion and managed with warfarin as described above.  4. Acute Kidney Injury on Chronic Kidney Disease: Noted to have BUN 54, Creatinine 2.36 with GFR of 31% on admission. UA showed proteinuria. Renal ultrasound did not show obvious renal pathology except for small cyst. Thought initially due to dehydration, he was given fluid resuscitation with mild improvement but creatinine still above 2.0. [facility] 1.6 many years ago. Renal function worsened during hospital stay with creatinine up to 3.1 but improved with inotrope support. He was able to maintain consistent urine output through the milrinone wean and diuretic use. At discharge, Bun 43, Creatinine 2.62, GFR 28%  5. Alcoholic Cirrhosis: On presentation noted to have history of hepatitis C cirrhosis with completed presentation and history of alcoholic cirrhosis as well. Found to have elevated alk phos of 272 but stable AST and ALT on admission. Noted to have elevated MELD score of 22 with INR of 1.6 and T bili of 1.5. RUQ ultrasound was performed showing evidence of cirrhosis with multiple small hepatic lesions, gall bladder wall thickening, cholelithiasis and trace ascites. Hepatitis C viral load was checked with negative findings. CT Abdomen was performed confirming finding of cirrhosis without evidence of gallbladder pathology. Discharged with recommendation to f/u with PCP.  6. Type 2 Diabetes Mellitus: On presentation noted to have glipizide on medication list with uncertain adherence. Hemoglobin a1c was found to be 8.3. On sliding scale insulin with good control during hospital stay. At discharge recommended to discontinue glipizide due to contraindication with cardiovascular disease and start metformin therapy. Recommended to f/u with PCP.  Discharge Vitals:   BP (!) 89/66 (BP Location: Left Arm)    Pulse 95    Temp 97.8 F (36.6 C) (Oral)    Resp (!) 29    Ht '5\' 8"'  (1.727 m)    Wt 58.2 kg    SpO2 98%    BMI 19.51 kg/m   Pertinent Labs, Studies, and Procedures:  BMP Latest Ref Rng & Units 08/01/2018 07/31/2018 07/30/2018  Glucose 70 - 99 mg/dL 153(H) 96 101(H)  BUN 8 - 23 mg/dL 43(H) 37(H) 39(H)  Creatinine 0.61 - 1.24 mg/dL 2.62(H) 2.51(H) 2.54(H)  Sodium 135 - 145 mmol/L 135 135 135  Potassium 3.5 - 5.1 mmol/L 4.2 4.4 4.0  Chloride 98 - 111 mmol/L 100 100 101  CO2 22 - 32 mmol/L '23 23 25    ' Calcium 8.9 - 10.3 mg/dL 9.3 9.1 9.1   CT CHEST, ABDOMEN AND PELVIS WITHOUT CONTRAST IMPRESSION: 1. Motion degraded exam.  Exam also limited by lack of IV contrast. 2. Right lower lobe airspace disease, consistent with pneumonia or aspiration. 3. Right larger than left pleural effusions. 4. Cirrhosis with small volume ascites. 5. Cholelithiasis. 6. Left nephrolithiasis. 7. Coronary artery atherosclerosis. Aortic Atherosclerosis  RENAL / URINARY TRACT ULTRASOUND COMPLETE IMPRESSION: Small simple right renal cysts are noted. No other renal abnormality is noted.  ULTRASOUND ABDOMEN LIMITED RIGHT UPPER QUADRANT IMPRESSION: 1. Cholelithiasis with gallbladder wall borderline thickened. The borderline gallbladder wall thickening could be secondary to ascites. Early acute cholecystitis could present  in this manner, however. In this regard, it may be prudent to consider nuclear medicine hepatobiliary imaging study to assess for cystic duct patency. 2. Nodular contour of the liver, felt to be indicative of a degree of cirrhosis. There are small lesions noted in the left and right lobes of the liver. The lesion in the right lobe probably represents a subcentimeter hemangioma. The small lesion in the left lobe is hypoechoic and of uncertain etiology. It may be prudent to consider follow-up liver ultrasound in approximately 1 year to assess for stability of these small lesions. 3.  There is a right pleural effusion. 4.  Trace ascites.  Lower Extremity Venous Ultrasound Summary: Right: Findings consistent with acute deep vein thrombosis involving the right femoral vein, right popliteal vein, right posterior tibial vein, right peroneal vein, and right gastrocnemius vein. No cystic structure found in the popliteal fossa. Left: There is no evidence of deep vein thrombosis in the lower extremity. No cystic structure found in the popliteal fossa.  LIMITED ECHOCARDIOGRAM IMPRESSIONS 1. The  left ventricle has a 2D calculated ejection fraction of 15%. Left ventricular diffuse hypokinesis. There is a large, mobile, layered apical left ventricular thrombus. The thrombus appears multilobular in shape SUMMARY Findings discussed with Dr. Haroldine Laws.  FINDINGS  Left Ventricle: The left ventricle has a 2D calculated ejection fraction of 15%. Left ventricular diffuse hypokinesis. Definity contrast agent was given IV to delineate the left ventricular endocardial borders. There is a large, mobile, layered apical  left ventricular thrombus. The thrombus appears multilobular in shape.  TRANSTHORACIC ECHOCARDIOGRAM IMPRESSIONS  1. The left ventricle has a visually estimated ejection fraction of 15%. The cavity size was normal. Left ventricular diastolic Doppler parameters are consistent with restrictive filling.  2. Cannot exclude LV apical thrombus vs. prominent trabeculation. Recommend focused echocardiogram with Definity for left ventricular enhancement to further assess.  3. The right ventricle has severely reduced systolic function. The cavity was mildly enlarged. There is no increase in right ventricular wall thickness.  4. Left atrial size was severely dilated.  5. Right atrial size was severely dilated.  6. Mitral valve regurgitation is moderate by color flow Doppler.  7. Tricuspid valve regurgitation is moderate-severe.  8. Mild calcification of the aortic valve.  Discharge Instructions: Discharge Instructions    (HEART FAILURE PATIENTS) Call MD:  Anytime you have any of the following symptoms: 1) 3 pound weight gain in 24 hours or 5 pounds in 1 week 2) shortness of breath, with or without a dry hacking cough 3) swelling in the hands, feet or stomach 4) if you have to sleep on extra pillows at night in order to breathe.   Complete by: As directed    Call MD for:  difficulty breathing, headache or visual disturbances   Complete by: As directed    Call MD for:  extreme fatigue    Complete by: As directed    Call MD for:  persistant dizziness or light-headedness   Complete by: As directed    Call MD for:  persistant nausea and vomiting   Complete by: As directed    Call MD for:  temperature >100.4   Complete by: As directed    Diet - low sodium heart healthy   Complete by: As directed    Discharge instructions   Complete by: As directed    Dear Ryan [name]  You came to [place] with weakness. We have determined this was caused by heart failure. Here are our recommendations for you at  discharge:  Please stop taking glipizide Please start metformin 538m twice a day Please take wellbutrin XL 1524mdaily Please take Torsemide 40 daily  Please take Hydralazine 12.5 twice daily Please take Imdur 15 daily at bedtime Please take Ivabradine 77m71mwice daily Please take warfarin 77mg66mily Please avoid alcohol use and avoid eating salt Please follow up with the heart failure clinic Please follow up with internal medicine clinic on 08/08/18 at 1:45 PM  Thank you for choosing [facility].   Increase activity slowly   Complete by: As directed      Signed: Kimbria Camposano,[name] 08/01/2018, 4:45 PM   Pager: [phone]

## 2018-07-17 NOTE — Progress Notes (Signed)
Occupational Therapy Treatment Patient Details Name: Ryan [name] MRN: [id number] DOB: 07-Feb-1951 Today's Date: 07/17/2018    History of present illness 68 yo male w/ a PMHx notable for alcohol use disorder, diabetes, and GAD who presented with a two month history of anorexia, weight loss reported at 60lbs, malaise and intermittent abdominal pain.   OT comments  Pt received in bed with RN providing medication. Pt agreeable to session, however, required max encouragement to engage. Received restless and lethargic. Pt demonstrated bed mobs with Mod I supine to sit <> sit to supine. UE exercises provided to improve strength for functional transfers and tasks. DC and freq remains the same. OT will continue to follow acutely.    Follow Up Recommendations  Home health OT;Supervision/Assistance - 24 hour    Equipment Recommendations  3 in 1 bedside commode    Recommendations for Other Services PT consult    Precautions / Restrictions Precautions Precautions: Fall Restrictions Weight Bearing Restrictions: No       Mobility Bed Mobility Overal bed mobility: Needs Assistance Bed Mobility: Supine to Sit     Supine to sit: Modified independent (Device/Increase time) Sit to supine: Modified independent (Device/Increase time);Min guard      Transfers Overall transfer level: Needs assistance                    Balance Overall balance assessment: Needs assistance Sitting-balance support: Bilateral upper extremity supported;Feet supported Sitting balance-Leahy Scale: Fair                                     ADL either performed or assessed with clinical judgement   ADL Overall ADL's : Needs assistance/impaired                     Lower Body Dressing: Sit to/from stand;Moderate assistance   Toilet Transfer: Modified Programmer, applications Details (indicate cue type and reason): use of urinal at EOB at start of session.             General ADL Comments: pt completed bed mobility to EOB for use of urinal and exercises with orange theraband.     Vision       Perception     Praxis      Cognition Arousal/Alertness: Awake/alert Behavior During Therapy: WFL for tasks assessed/performed Overall Cognitive Status: Within Functional Limits for tasks assessed                                 General Comments: Pt required max cues for encouragement to move to edge of bed to engage in UE exercises with theraband        Exercises Exercises: General Upper Extremity General Exercises - Upper Extremity Elbow Flexion: Strengthening;Both;Seated;Theraband;20 reps Theraband Level (Elbow Flexion): Level 1 (Yellow) Elbow Extension: Strengthening;Both;Seated;Theraband;20 reps Theraband Level (Elbow Extension): Level 1 (Yellow)   Shoulder Instructions       General Comments Edema of B feet. Pt repositioned with feet elevation to decrease edema.     Pertinent Vitals/ Pain       Pain Assessment: No/denies pain Pain Intervention(s): Monitored during session  Home Living  Prior Functioning/Environment              Frequency  Min 3X/week        Progress Toward Goals  OT Goals(current goals can now be found in the care plan section)  Progress towards OT goals: Progressing toward goals  Acute Rehab OT Goals Patient Stated Goal: Agreeable to getting OOB OT Goal Formulation: With patient Time For Goal Achievement: 07/28/18 Potential to Achieve Goals: Good ADL Goals Pt Will Perform Grooming: with modified independence;sitting;standing Pt Will Perform Upper Body Bathing: with modified independence;sitting Pt Will Perform Lower Body Bathing: with modified independence;sit to/from stand Pt Will Perform Upper Body Dressing: with modified independence;sitting Pt Will Perform Lower Body Dressing: with modified independence;sit to/from stand Pt  Will Transfer to Toilet: with modified independence;ambulating Pt Will Perform Toileting - Clothing Manipulation and hygiene: with modified independence;sit to/from stand Pt Will Perform Tub/Shower Transfer: Tub transfer;with modified independence;ambulating;3 in 1;rolling walker  Plan Discharge plan remains appropriate;Frequency remains appropriate    Co-evaluation                 AM-PAC OT "6 Clicks" Daily Activity     Outcome Measure   Help from another person eating meals?: None Help from another person taking care of personal grooming?: A Little Help from another person toileting, which includes using toliet, bedpan, or urinal?: A Lot Help from another person bathing (including washing, rinsing, drying)?: A Lot Help from another person to put on and taking off regular upper body clothing?: None Help from another person to put on and taking off regular lower body clothing?: A Lot 6 Click Score: 17    End of Session Equipment Utilized During Treatment: Rolling walker;Oxygen(2L)  OT Visit Diagnosis: Unsteadiness on feet (R26.81);Muscle weakness (generalized) (M62.81);Pain;Dizziness and giddiness (R42)   Activity Tolerance Other (comment);Patient limited by lethargy(pt observed to be lethargic and restless during session.)   Patient Left in bed;with call bell/phone within reach;with bed alarm set;with nursing/sitter in room   Nurse Communication Mobility status;Other (comment)(dizzy once sitting EOB; lethargic)        Time: 3329-5188 OT Time Calculation (min): 12 min  Charges: OT General Charges $OT Visit: 1 Visit  Ryan [name], MSOT, OTR/L  Supplemental Rehabilitation Services  [phone]    Ryan [name] 07/17/2018, 12:28 PM

## 2018-07-17 NOTE — Progress Notes (Signed)
SATURATION QUALIFICATIONS: (This note is used to comply with regulatory documentation for home oxygen)  Patient Saturations on Room Air at Rest =99%  Patient Saturations on Room Air while Ambulating =96%  Patient Saturations on 0 Liters of oxygen while Ambulating =96%  Please briefly explain why patient needs home oxygen:

## 2018-07-17 NOTE — Care Management Important Message (Signed)
Important Message  Patient Details  Name: Ryan [name] MRN: [id number] Date of Birth: 02-26-1950   Medicare Important Message Given:  Yes    [name] 07/17/2018, 2:08 PM

## 2018-07-18 ENCOUNTER — Inpatient Hospital Stay (HOSPITAL_COMMUNITY): Payer: Medicare Other

## 2018-07-18 DIAGNOSIS — I340 Nonrheumatic mitral (valve) insufficiency: Secondary | ICD-10-CM

## 2018-07-18 DIAGNOSIS — I361 Nonrheumatic tricuspid (valve) insufficiency: Secondary | ICD-10-CM

## 2018-07-18 LAB — BASIC METABOLIC PANEL
Anion gap: 13 (ref 5–15)
Anion gap: 11 (ref 5–15)
BUN: 48 mg/dL — ABNORMAL HIGH (ref 8–23)
BUN: 56 mg/dL — ABNORMAL HIGH (ref 8–23)
CO2: 18 mmol/L — ABNORMAL LOW (ref 22–32)
CO2: 16 mmol/L — ABNORMAL LOW (ref 22–32)
Calcium: 9 mg/dL (ref 8.9–10.3)
Calcium: 9 mg/dL (ref 8.9–10.3)
Chloride: 109 mmol/L (ref 98–111)
Chloride: 111 mmol/L (ref 98–111)
Creatinine, Ser: 1.95 mg/dL — ABNORMAL HIGH (ref 0.61–1.24)
Creatinine, Ser: 2.23 mg/dL — ABNORMAL HIGH (ref 0.61–1.24)
GFR calc Af Amer: 40 mL/min — ABNORMAL LOW (ref >60)
GFR calc Af Amer: 34 mL/min — ABNORMAL LOW (ref >60)
GFR calc non Af Amer: 34 mL/min — ABNORMAL LOW (ref >60)
GFR calc non Af Amer: 29 mL/min — ABNORMAL LOW (ref >60)
Glucose, Bld: 140 mg/dL — ABNORMAL HIGH (ref 70–99)
Glucose, Bld: 171 mg/dL — ABNORMAL HIGH (ref 70–99)
Potassium: 5.7 mmol/L — ABNORMAL HIGH (ref 3.5–5.1)
Potassium: 4.8 mmol/L (ref 3.5–5.1)
Sodium: 140 mmol/L (ref 135–145)
Sodium: 138 mmol/L (ref 135–145)

## 2018-07-18 LAB — HCV RNA QUANT: HCV Quantitative: UNDETERMINED IU/mL (ref >50)

## 2018-07-18 LAB — GLUCOSE, CAPILLARY
Glucose-Capillary: 197 mg/dL — ABNORMAL HIGH (ref 70–99)
Glucose-Capillary: 224 mg/dL — ABNORMAL HIGH (ref 70–99)
Glucose-Capillary: 150 mg/dL — ABNORMAL HIGH (ref 70–99)
Glucose-Capillary: 90 mg/dL (ref 70–99)

## 2018-07-18 LAB — ECHOCARDIOGRAM COMPLETE
Height: 68 in
Weight: 2473.6 oz

## 2018-07-18 MED ORDER — BOOST / RESOURCE BREEZE PO LIQD CUSTOM: 1.0000 | Freq: Two times a day (BID) | ORAL | Status: DC

## 2018-07-18 MED ORDER — PRO-STAT SUGAR FREE PO LIQD
30.0000 mL | Freq: Two times a day (BID) | ORAL | Status: DC
  Administered 2018-07-18: 30 mL via ORAL
  Filled 2018-07-18 (×2): qty 30

## 2018-07-18 MED ORDER — RAMELTEON 8 MG PO TABS
8.0000 mg | ORAL_TABLET | Freq: Every day | ORAL | Status: DC
  Administered 2018-07-18 – 2018-07-31 (×14): 8 mg via ORAL
  Filled 2018-07-18 (×16): qty 1

## 2018-07-18 MED ORDER — FUROSEMIDE 10 MG/ML IJ SOLN
40.0000 mg | Freq: Once | INTRAMUSCULAR | Status: AC
  Administered 2018-07-18: 40 mg via INTRAVENOUS
  Filled 2018-07-18: qty 4

## 2018-07-18 MED ORDER — ENOXAPARIN SODIUM 40 MG/0.4ML ~~LOC~~ SOLN: 40.0000 mg | SUBCUTANEOUS | Status: DC

## 2018-07-18 NOTE — Progress Notes (Signed)
Subjective:  Azzam Mehra is a 68 y.o. M with PMH of alcoholic cirrhosis, O8NO admit for unexplained weight loss on hospital day 5  Mr. Moomaw was examined at bedside this morning and reports continuing to feel short of breath. He mentions that he is afraid that he may be dying. He also noted that overnight he appears to have developed worsening lower extremity swelling. Discussed results of his cytology and again stressed importance of sobriety from alcohol use for his health. Mr.Bebeau expressed understanding. Nursing staff states that he is tolerating PO intake and looks improved from yesterday.  Objective:  Vital signs in last 24 hours: Vitals:   07/17/18 1127 07/17/18 1233 07/17/18 2057 07/18/18 0412  BP:  (!) 122/92 90/66 (!) 123/95  Pulse:  (!) 115 (!) 104 (!) 111  Resp:  18 16 (!) 21  Temp:  98.1 F (36.7 C) (!) 97.5 F (36.4 C) 97.6 F (36.4 C)  TempSrc:  Oral Oral Axillary  SpO2:  99% 100% 98%  Weight: 69.2 kg     Height:       Gen: Chronically ill-appearing HEENT: + temporal wasting Neck: supple, ROM intact, +JVD CV: RRR, S1, S2 normal, ?systolic LSB murmur Pulm: Diminished breath sounds  Abd: Soft, BS+, NTND, No rebound, no guarding Extm: ROM intact, Peripheral pulses intact, 1+ pitting edema bilateral lower extremities R>L Skin: Dry, Warm, normal turgor, no rashes, lesions, wounds.  Neuro: AAOx3  Assessment/Plan:  Principal Problem:   Weight loss, unintentional Active Problems:   Acute kidney injury ([place])   History of alcohol use disorder   Proteinuria   Lactic acidosis   Cholestasis   Thrombocytopenia (HCC)   Alcoholic cirrhosis (HCC)   Hyperkalemia   Metabolic acidosis, normal anion gap (NAG)   Pleural effusion  Mr.Mackinley Soter is a 68 yo M w/ PMH of alcoholic cirrhosis and M7EH admit for AKI and unintentional weight loss. This morning he appears to be more in respiratory distress despite having no recorded desaturation events.  According to nursing staff, he did desat overnight to 67s. Concerning for re-occurrence of his pleural effusion. Will get further work-up with x-ray as well as echo to evaluate for other possible etiology for his hypervolemia as he is also exhibiting pitting edema this morning despite not receiving additional IV fluids.  UnintentionalWeight loss2/2 poor oral intake vs depression vs malignancy  69.5kg -> 70kg since admission. Poor oral intake due to depression higher on differential since improvement with regular hospital food. PHQ-9 this am at 19 - moderate-severe depression. CT chest/abd/pelvisw/ large R sided pleural effusion concerning for malignancy vs hepatothorax. Thoracentesis performed yesterday w/ 1.2 transudate effusion (protein <3, LDH 123). Cytology shows reactive mesothelial cells. SPEP shows no M-spike.  - C/w Wellbutrin 160m daily for depression & alcohol craving - Zofran for nausea - Daily weights  Dyspnea 2/2 pulmonary edema vs PE vs underlying lung disease. 1.2L out from right lung 07/16/18. This AM 95% on RA but in respiratory distress on physical exam. Also showing pitting edema bilateral lower extremities R>L. + JVD. - Chest X-ray this AM - TTE - [place] to r/o DVT - IV furosemide 451m - Wean to RA - Keep O2 sat > 88  AKI on CKD 2/2 progression of CKD(type 4 RTA)vs dehydration Creatinine 2.3->2.17->2.23->2.09->1.91->1.95. K 5.7 this am. Declined renal function appears to be more chronic rather than acute. Continue to trend.Renal US[place]ore[place]all simple cysts, no hydronephrosis. Aldosterone/renin ratio pending - F/u aldosterone/renin - IV lasix as above -  Trend renal function - Avoid nephrotoxic meds  Hx of Cirrhosis: per chart review, hx of hepatitis C infection (confirmed via biopsy) s/p treatment, steatosis, and alcoholic cirrhosis from 4370 [AST 148, ALT 102, Bili of 20.1]. No record since then. AST 3 ALT 19, Alk phos 232, T.bili 1.5.MELD score of 19. Child-Pugh  score 9. Abd [place]: cholelithiasis w/o obstruction. Cirrhotic liver with trace ascites. Right lobe hemangioma and left lobe lesion. Hep C RNA pending -F/uHep C RNA - Trend hepatic function - Need to follow up with hepatology outpatient for screening for transplant - If worsening liver function, start lactulose   EtOH use disorder: Drinks 1.5 pint bourbon daily but states last drink was 1.5 months ago. 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 - CIWA w/ ATIVAN - Folate, Thiamine, Vitamins  DVT prophx:Enoxaparin Diet:2 gram sodium diet Bowel:Senokot PRN Code:DNR  Dispo: Anticipated discharge in approximately 2-3 day(s).   [name], MD 07/18/2018, 6:37 AM Pager: [phone]

## 2018-07-18 NOTE — Progress Notes (Signed)
  Echocardiogram 2D Echocardiogram has been performed.  Jowanna Loeffler G Taleisha Kaczynski 07/18/2018, 1:17 PM

## 2018-07-18 NOTE — Progress Notes (Signed)
Patient dry heaves and sm all amount of emesis after taking 1/2 dose of Pro-Stat.

## 2018-07-18 NOTE — Progress Notes (Signed)
Nutrition Follow-up  RD working remotely.  DOCUMENTATION CODES:   Not applicable  INTERVENTION:   -Continue MVI with minerals daily -D/c Nepro shake due to poor acceptance -30 ml Prostat BID, each supplement provides 100 kcals and 15 grams protein -Boost Breeze po BID, each supplement provides 250 kcal and 9 grams of protein  NUTRITION DIAGNOSIS:   Inadequate oral intake related to poor appetite as evidenced by meal completion < 25%.  Progressing   GOAL:   Patient will meet greater than or equal to 90% of their needs  Progressing  MONITOR:   PO intake, Supplement acceptance, Skin, Labs, Weight trends, I & O's  REASON FOR ASSESSMENT:   Consult Poor PO  ASSESSMENT:   68 yo male w/ a PMHx notable for alcohol use disorder, substance abuse, cirrhosis, Hep C, diabetes who presents with wt loss, dysphaiga, nausea and abdominal pain x 2 months.   6/8- s/p thoaracentesis  Spoke with pt via phone, who reports feeling better today. He reports his appetite has improved- consumed some cereal earlier today and also reports eating some potatoes and orange juice. Meal completion documented at 25-75%. He denies any difficulty swallowing foods or liquids, however, reports his throat is "still dry". Meal He mentions that he was on an appetite stimulant "a long, long time ago" and does not remember the name of the medication or when he last took it; per his report, MD may resume appetite stimulant at discharge.   Pt reports being compliant with Nepro supplement even though he does not care for it. RD encouraged pt to continue with supplement, however, pt stated "no, it's alright"; discussed importance of good meal and supplement intake to promote healing.   Pt asked this RD if he could take his home medications he has with him. Instructed pt to only make medications provided to him by a staff member. RD also assured pt that he would be instructed regarding medication needs at discharge.    Labs reviewed: K: 5.7, CBGS: 197-224 (inpatient orders for glycemic control are 0-15 units insulin aspart TID with meals, 0-5 units insulin aspart q HS, and 10 units insulin glargine q HS).   Diet Order:   Diet Order            DIET DYS 3 Room service appropriate? Yes; Fluid consistency: Thin  Diet effective now              EDUCATION NEEDS:   No education needs have been identified at this time  Skin:  Skin Assessment: Reviewed RN Assessment  Last BM:  07/18/18  Height:   Ht Readings from Last 1 Encounters:  07/14/18 5\' 8"  (1.727 m)    Weight:   Wt Readings from Last 1 Encounters:  07/18/18 70.1 kg    Ideal Body Weight:  70 kg  BMI:  Body mass index is 23.51 kg/m.  Estimated Nutritional Needs:   Kcal:  1900-2200kcal/day   Protein:  95-110g/day   Fluid:  >1.8L/day     Akesha Uresti A. Jimmye Norman, RD, LDN, [place] Registered Dietitian II Certified Diabetes Care and Education Specialist Pager: [phone] After hours Pager: [phone]

## 2018-07-18 NOTE — Plan of Care (Signed)

## 2018-07-18 NOTE — Progress Notes (Signed)
Order to check O2 sats while ambulating.   Ambulating in room with 1 person assistance and walker. O2 sats down to 85%,  on room air and HR 120's . Oxygen reapplied and assisted to bed.

## 2018-07-18 NOTE — Progress Notes (Addendum)
Physical Therapy Treatment Patient Details Name: Ryan [name] MRN: [id number] DOB: 1950/06/11 Today's Date: 07/18/2018    History of Present Illness 68 yo male w/ a PMHx notable for alcohol use disorder, diabetes, and GAD who presented with a two month history of anorexia, weight loss reported at 60lbs, malaise and intermittent abdominal pain.    PT Comments    Pt seen for functional mobility progression. Pt given encouragement to participate, and was limited in treatment due to 4/4 DOE with even minor movements of body. HR up to 130bpm with transfers. Pt on 2L of 02 throughout brief session.  Due to DOE, pt required increased time to complete bed mobility and functional transfers. Will continue to follow acutely.   Follow Up Recommendations  Home health PT     Equipment Recommendations  Rolling walker with 5" wheels    Recommendations for Other Services       Precautions / Restrictions Precautions Precautions: Fall Restrictions Weight Bearing Restrictions: No    Mobility  Bed Mobility Overal bed mobility: Needs Assistance Bed Mobility: Supine to Sit     Supine to sit: Modified independent (Device/Increase time) Sit to supine: Modified independent (Device/Increase time);Min guard   General bed mobility comments: Supervision for safety; no difficulty noted  Transfers Overall transfer level: Needs assistance Equipment used: Rolling walker (2 wheeled) Transfers: Sit to/from Stand           General transfer comment: Repeated cues for safe hand placement (pt initially pulling on RW to achieve standing).  Unsteady when standing, min A to steady.     Ambulation/Gait Ambulation/Gait assistance: Min assist Gait Distance (Feet): 4 Feet(x 2 trial) Assistive device: Rolling walker (2 wheeled) Gait Pattern/deviations: Step-through pattern;Trunk flexed;Decreased step length - right;Decreased step length - left     General Gait Details: Pt declined further gait  distance despite max encouragement (due to DOE)     Modified Rankin (Stroke Patients Only)       Balance Overall balance assessment: Needs assistance Sitting-balance support: Bilateral upper extremity supported;Feet supported Sitting balance-Leahy Scale: Fair     Standing balance support: Bilateral upper extremity supported;During functional activity Standing balance-Leahy Scale: Poor Standing balance comment: RW for support in standing         Cognition Arousal/Alertness: Awake/alert Behavior During Therapy: WFL for tasks assessed/performed Overall Cognitive Status: Within Functional Limits for tasks assessed         Exercises General Exercises - Lower Extremity Ankle Circles/Pumps: Both;20 reps;Supine(long sitting) Quad Sets: Both;10 reps    General Comments  Pt initially transferred to chair, when Echo arrived for procedure. Pt completed 2nd transfer back to bed with Echo present.       Pertinent Vitals/Pain Faces Pain Scale: Hurts little more Pain Location: grimacing with bed mobility, reports chest feels heavy  Pain Intervention(s): Limited activity within patient's tolerance;Monitored during session    Home Living   Prior Function    PT Goals (current goals can now be found in the care plan section) Acute Rehab PT Goals Patient Stated Goal: Agreeable to getting OOB PT Goal Formulation: With patient Time For Goal Achievement: 07/29/18 Potential to Achieve Goals: Good    Frequency    Min 3X/week      PT Plan Current plan remains appropriate    AM-PAC PT "6 Clicks" Mobility   Outcome Measure  Help needed turning from your back to your side while in a flat bed without using bedrails?: None Help needed moving from lying on your back to  sitting on the side of a flat bed without using bedrails?: None Help needed moving to and from a bed to a chair (including a wheelchair)?: A Little Help needed standing up from a chair using your arms (e.g.,  wheelchair or bedside chair)?: A Little Help needed to walk in hospital room?: A Little Help needed climbing 3-5 steps with a railing? : A Little 6 Click Score: 20    End of Session Equipment Utilized During Treatment: Gait belt Activity Tolerance: Patient limited by fatigue Patient left: in bed;with call bell/phone within reach;Other (comment)(Echo in room at end of session, for procedure) Nurse Communication: Mobility status PT Visit Diagnosis: Unsteadiness on feet (R26.81);Other abnormalities of gait and mobility (R26.89)     Time: 7939-0300 PT Time Calculation (min) (ACUTE ONLY): 15 min  Charges:  $Therapeutic Activity: 8-22 mins                     Kerin Perna, PTA Acute Rehab [phone]   Shelbie Hutching 07/18/2018, 1:18 PM

## 2018-07-18 NOTE — Progress Notes (Signed)
  Date: 07/18/2018  Patient name: Ryan [name]  Medical record number: [id number]  Date of birth: May 13, 1950   I have seen and evaluated this patient and I have discussed the plan of care with the house staff. Please see their note for complete details. I concur with their findings with the following additions/corrections:   Unfortunately, appears more short of breath this morning.  Was having trouble catching his breath between sentences.  Was placed on O2 overnight for reported O2 sat down to 69%.  Tachycardic 120-130 as well.  He has a new heart murmur this morning, 2/6 at the left lower sternal border.  On my exam, it appears his pleural effusions may have increased slightly in size as he has dullness to percussion extending up the bottom quarter of his lungs on the left, slightly less on the right.  CXR today has mild bibasilar opacities suggestive of increasing pleural effusions without clear costophrenic angle blunting, but this was not present on his initial CXR either and he had significant pleural effusions noted on CT.  We will also check a TTE to evaluate his new murmur, and bedside ultrasound to assess size of his pleural effusions.  He has asymmetric swelling of his legs and we will check for DVT with lower extremity Doppler.  Unfortunately, his weight is down again today as well.  Given his multiple worsening problems, he is not safe for discharge at this time.  Lenice Pressman, M.D., Ph.D. 07/18/2018, 3:31 PM

## 2018-07-18 NOTE — Progress Notes (Signed)
Patient given Lasix IV as ordered earlier today. Has voided with BM x2. Unable to measure urine. Reminded patient to please use urinal.  Since then, patient has voided x3. Each time, small amounts 100 cc or less. Bladder scan after most recent void. Reading  82 cc. Will continue to monitor.

## 2018-07-18 NOTE — Progress Notes (Signed)
  Date: 07/17/2018  Patient name: Ryan [name]  Medical record number: [id number]  Date of birth: 04-17-1950   I have seen and evaluated this patient and I have discussed the plan of care with the house staff. Please see their note for complete details.  Lenice Pressman, M.D., Ph.D.

## 2018-07-19 ENCOUNTER — Inpatient Hospital Stay (HOSPITAL_COMMUNITY): Payer: Medicare Other

## 2018-07-19 ENCOUNTER — Inpatient Hospital Stay: Payer: Self-pay

## 2018-07-19 DIAGNOSIS — R570 Cardiogenic shock: Secondary | ICD-10-CM

## 2018-07-19 DIAGNOSIS — I5082 Biventricular heart failure: Secondary | ICD-10-CM

## 2018-07-19 DIAGNOSIS — R634 Abnormal weight loss: Secondary | ICD-10-CM

## 2018-07-19 DIAGNOSIS — I2699 Other pulmonary embolism without acute cor pulmonale: Secondary | ICD-10-CM

## 2018-07-19 HISTORY — DX: Cardiogenic shock: R57.0

## 2018-07-19 LAB — ECHOCARDIOGRAM LIMITED
Height: 68 in
Weight: 2473.6 oz

## 2018-07-19 LAB — BASIC METABOLIC PANEL
Anion gap: 11 (ref 5–15)
BUN: 52 mg/dL — ABNORMAL HIGH (ref 8–23)
CO2: 17 mmol/L — ABNORMAL LOW (ref 22–32)
Calcium: 9 mg/dL (ref 8.9–10.3)
Chloride: 110 mmol/L (ref 98–111)
Creatinine, Ser: 2.19 mg/dL — ABNORMAL HIGH (ref 0.61–1.24)
GFR calc Af Amer: 34 mL/min — ABNORMAL LOW (ref >60)
GFR calc non Af Amer: 30 mL/min — ABNORMAL LOW (ref >60)
Glucose, Bld: 125 mg/dL — ABNORMAL HIGH (ref 70–99)
Potassium: 5 mmol/L (ref 3.5–5.1)
Sodium: 138 mmol/L (ref 135–145)

## 2018-07-19 LAB — GLUCOSE, CAPILLARY
Glucose-Capillary: 149 mg/dL — ABNORMAL HIGH (ref 70–99)
Glucose-Capillary: 123 mg/dL — ABNORMAL HIGH (ref 70–99)
Glucose-Capillary: 115 mg/dL — ABNORMAL HIGH (ref 70–99)
Glucose-Capillary: 155 mg/dL — ABNORMAL HIGH (ref 70–99)
Glucose-Capillary: 105 mg/dL — ABNORMAL HIGH (ref 70–99)

## 2018-07-19 LAB — HEPARIN LEVEL (UNFRACTIONATED): Heparin Unfractionated: <0.1 IU/mL — ABNORMAL LOW (ref 0.30–0.70)

## 2018-07-19 LAB — COOXEMETRY PANEL
Carboxyhemoglobin: 0.7 % (ref 0.5–1.5)
Methemoglobin: 0.7 % (ref 0.0–1.5)
O2 Saturation: 45.1 %
Total hemoglobin: 15.5 g/dL (ref 12.0–16.0)

## 2018-07-19 MED ORDER — SODIUM CHLORIDE 0.9% FLUSH: 10.0000 mL | INTRAVENOUS | Status: DC | PRN

## 2018-07-19 MED ORDER — FUROSEMIDE 10 MG/ML IJ SOLN
80.0000 mg | Freq: Once | INTRAMUSCULAR | Status: AC
  Administered 2018-07-19: 80 mg via INTRAVENOUS
  Filled 2018-07-19: qty 8

## 2018-07-19 MED ORDER — SODIUM CHLORIDE 0.9% FLUSH
10.0000 mL | Freq: Two times a day (BID) | INTRAVENOUS | Status: DC
  Administered 2018-07-19: 30 mL
  Administered 2018-07-19: 10 mL
  Administered 2018-07-20: 30 mL
  Administered 2018-07-20 – 2018-07-22 (×5): 10 mL
  Administered 2018-07-23: 09:00:00 30 mL
  Administered 2018-07-23: 10 mL
  Administered 2018-07-24: 08:00:00 30 mL
  Administered 2018-07-24 – 2018-07-31 (×10): 10 mL
  Administered 2018-07-31: 30 mL
  Administered 2018-08-01: 10 mL

## 2018-07-19 MED ORDER — FUROSEMIDE 10 MG/ML IJ SOLN: 80.0000 mg | Freq: Once | INTRAMUSCULAR | Status: DC

## 2018-07-19 MED ORDER — MILRINONE LACTATE IN DEXTROSE 20-5 MG/100ML-% IV SOLN
0.2500 ug/kg/min | INTRAVENOUS | Status: DC
  Administered 2018-07-19: 0.25 ug/kg/min via INTRAVENOUS
  Filled 2018-07-19: qty 100

## 2018-07-19 MED ORDER — BOOST / RESOURCE BREEZE PO LIQD CUSTOM
1.0000 | Freq: Two times a day (BID) | ORAL | Status: DC
  Administered 2018-07-19 – 2018-07-26 (×9): 1 via ORAL

## 2018-07-19 MED ORDER — ENSURE MAX PROTEIN PO LIQD
11.0000 [oz_av] | Freq: Every day | ORAL | Status: DC
  Administered 2018-07-19: 11 [oz_av] via ORAL
  Filled 2018-07-19 (×4): qty 330

## 2018-07-19 MED ORDER — HEPARIN BOLUS VIA INFUSION
2000.0000 [IU] | Freq: Once | INTRAVENOUS | Status: AC
  Administered 2018-07-19: 2000 [IU] via INTRAVENOUS
  Filled 2018-07-19: qty 2000

## 2018-07-19 MED ORDER — HEPARIN (PORCINE) 25000 UT/250ML-% IV SOLN
1150.0000 [IU]/h | INTRAVENOUS | Status: DC
  Administered 2018-07-19: 1150 [IU]/h via INTRAVENOUS
  Administered 2018-07-20: 1500 [IU]/h via INTRAVENOUS
  Administered 2018-07-25 – 2018-07-26 (×2): 1050 [IU]/h via INTRAVENOUS
  Filled 2018-07-19 (×12): qty 250

## 2018-07-19 MED ORDER — FUROSEMIDE 10 MG/ML IJ SOLN
80.0000 mg | Freq: Two times a day (BID) | INTRAMUSCULAR | Status: AC
  Administered 2018-07-19 – 2018-07-20 (×3): 80 mg via INTRAVENOUS
  Filled 2018-07-19 (×3): qty 8

## 2018-07-19 MED ORDER — FUROSEMIDE 10 MG/ML IJ SOLN
40.0000 mg | Freq: Once | INTRAMUSCULAR | Status: AC
  Administered 2018-07-19: 40 mg via INTRAVENOUS
  Filled 2018-07-19: qty 4

## 2018-07-19 MED ORDER — METOLAZONE 5 MG PO TABS
5.0000 mg | ORAL_TABLET | Freq: Once | ORAL | Status: AC
  Administered 2018-07-19: 5 mg via ORAL
  Filled 2018-07-19: qty 1

## 2018-07-19 MED ORDER — HEPARIN BOLUS VIA INFUSION
3600.0000 [IU] | Freq: Once | INTRAVENOUS | Status: AC
  Administered 2018-07-19: 16:00:00 3600 [IU] via INTRAVENOUS
  Filled 2018-07-19: qty 3600

## 2018-07-19 NOTE — Consult Note (Addendum)
Advanced Heart Failure Team Consult Note   Primary Physician: Patient, No Pcp Per PCP-Cardiologist:  No primary care provider on file.  [facility]   Reason for Consultation:Heart Failure   HPI:    Ryan [name] is seen today for evaluation of new acute systolic heart failure at the request of Dr Rebeca Alert.  Ryan [name] is a 68 year old with history of ETOH abuse, cocaine abuse, diabetes, hep c, and anxiety. He has had 60 pound weight loss over the last 2 months.   Presented to [facility] with increased dyspnea, fatigue, and abdominal pain. CXR showed cardiomegaly. On admit creatinine was 2.36, K 5.9, total bili 1.5, TSH 7.7and hgb 16.8. Primary team was concerned about malignancy with unexplained weight loss. He was initially given  IV fluid. CT of chest/abd/pelvis showed bilateral pleural effusions and cirrhosis.On 6/8 he had thoracentesis on R with 1.3 liters removed.  SPEP/UPEP obtained. No M Spike was noted.   Yesterday he developed tachycardia and hypoxia. CXR showed increasing pleural effusions. ECHO was performed and showed biventricular HF, possible apical thrombus versus trabeculation. He has been ongoing N/V and has remained tachycardic.    Echo 07/18/18 EF 15%. Possible LV thrombus vs trabeculation. , RA/LA severely dilated, and RV severely reduced.   Review of Systems: [y] = yes, [ ]  = no   . General: Weight gain [ ] ; Weight loss [Y ]; Anorexia [Y ]; Fatigue [Y ]; Fever [ ] ; Chills [ ] ; Weakness [Y ]  . Cardiac: Chest pain/pressure [ ] ; Resting SOB [ Y]; Exertional SOB [Y ]; Orthopnea [ ] ; Pedal Edema [Y ]; Palpitations [ ] ; Syncope [ ] ; Presyncope [ ] ; Paroxysmal nocturnal dyspnea[ ]   . Pulmonary: Cough [ ] ; Wheezing[ ] ; Hemoptysis[ ] ; Sputum [ ] ; Snoring [ ]   . GI: Vomiting[ ] ; Dysphagia[ ] ; Melena[ ] ; Hematochezia [ ] ; Heartburn[ ] ; Abdominal pain [Y ]; Constipation [ ] ; Diarrhea [ ] ; BRBPR [ ]   . GU: Hematuria[ ] ; Dysuria [ ] ; Nocturia[ ]   . Vascular: Pain in legs with walking  [ ] ; Pain in feet with lying flat [ ] ; Non-healing sores [ ] ; Stroke [ ] ; TIA [ ] ; Slurred speech [ ] ;  . Neuro: Headaches[ ] ; Vertigo[ ] ; Seizures[ ] ; Paresthesias[ ] ;Blurred vision [ ] ; Diplopia [ ] ; Vision changes [ ]   . Ortho/Skin: Arthritis [ ] ; Joint pain [ Y]; Muscle pain [ ] ; Joint swelling [ ] ; Back Pain [ Y]; Rash [ ]   . Psych: Depression[ ] ; Anxiety[Y ]  . Heme: Bleeding problems [ ] ; Clotting disorders [ ] ; Anemia [ ]   . Endocrine: Diabetes [ Y]; Thyroid dysfunction[ ]   Home Medications Prior to Admission medications   Medication Sig Start Date End Date Taking? Authorizing Provider  glipiZIDE (GLUCOTROL) 5 MG tablet Take 5 mg by mouth daily before breakfast.   Yes [provider]  hydrOXYzine (ATARAX/VISTARIL) 10 MG tablet Take 10 mg by mouth at bedtime as needed (sleep).   Yes [provider]    Past Medical History: History reviewed. No pertinent past medical history.  Past Surgical History: Past Surgical History:  Procedure Laterality Date  . IR THORACENTESIS ASP PLEURAL SPACE W/IMG GUIDE  07/16/2018    Family History: No family history on file.  Social History: Social History   Socioeconomic History  . Marital status: Single    Spouse name: Not on file  . Number of children: Not on file  . Years of education: Not on file  . Highest education  level: Not on file  Occupational History  . Not on file  Social Needs  . Financial resource strain: Not on file  . Food insecurity    Worry: Not on file    Inability: Not on file  . Transportation needs    Medical: Not on file    Non-medical: Not on file  Tobacco Use  . Smoking status: Former Research scientist (life sciences)  . Smokeless tobacco: Never Used  Substance and Sexual Activity  . Alcohol use: Not Currently    Frequency: Never  . Drug use: Never  . Sexual activity: Not on file  Lifestyle  . Physical activity    Days per week: Not on file    Minutes per session: Not on file  . Stress: Not on file   Relationships  . Social Herbalist on phone: Not on file    Gets together: Not on file    Attends religious service: Not on file    Active member of club or organization: Not on file    Attends meetings of clubs or organizations: Not on file    Relationship status: Not on file  Other Topics Concern  . Not on file  Social History Narrative  . Not on file    Allergies:  Allergies  Allergen Reactions  . Ace Inhibitors Cough    Objective:    Vital Signs:   Temp:  [97.5 F (36.4 C)] 97.5 F (36.4 C) (06/11 0612) Pulse Rate:  [98-118] 98 (06/11 0612) Resp:  [18-20] 18 (06/11 0612) BP: (99-114)/(70-93) 99/70 (06/11 0612) SpO2:  [97 %-100 %] 97 % (06/11 0612) Weight:  [70.1 kg] 70.1 kg (06/10 0917) Last BM Date: 07/18/18  Weight change: Filed Weights   07/17/18 1127 07/18/18 0842 07/18/18 0917  Weight: 69.2 kg 70.1 kg 70.1 kg    Intake/Output:   Intake/Output Summary (Last 24 hours) at 07/19/2018 0832 Last data filed at 07/19/2018 0130 Gross per 24 hour  Intake 220 ml  Output 325 ml  Net -105 ml      Physical Exam    General:  Ill-appearing. SOB at rest HEENT: normal + temporal wasting Neck: supple. JVP to ear . Carotids 2+ bilat; no bruits. No lymphadenopathy or thyromegaly appreciated. Cor: PMI laterally displaced. Regular rate & rhythm. + s3 Lungs: crackles Abdomen: soft, nontender, nondistended. No hepatosplenomegaly. No bruits or masses. Good bowel sounds. Extremities: no cyanosis, clubbing, rash, 2+ edema cool Neuro: alert & orientedx3, cranial nerves grossly intact. moves all 4 extremities w/o difficulty. Affect pleasant   Telemetry   Sinus Tach 100-110 Personally reviewed  EKG    Sinus Tach 108 bpm LVH with repol Personally reviewed  Labs   Basic Metabolic Panel: Recent Labs  Lab 07/14/18 1231  07/16/18 0216 07/17/18 0209 07/18/18 0656 07/18/18 1601 07/19/18 0201  NA  --    < > 140 139 140 138 138  K  --    < > 4.3 4.5 5.7*  4.8 5.0  CL  --    < > 113* 112* 109 111 110  CO2  --    < > 18* 18* 18* 16* 17*  GLUCOSE  --    < > 110* 171* 140* 171* 125*  BUN  --    < > 50* 48* 48* 56* 52*  CREATININE  --    < > 2.09* 1.91* 1.95* 2.23* 2.19*  CALCIUM  --    < > 8.7* 8.8* 9.0 9.0 9.0  PHOS 4.2  --   --   --   --   --   --    < > =  values in this interval not displayed.    Liver Function Tests: Recent Labs  Lab 07/13/18 1136 07/14/18 0413 07/16/18 0216 07/17/18 0209  AST 31 39 79* 63*  ALT 19 20 54* 49*  ALKPHOS 232* 272* 437* 418*  BILITOT 1.5* 1.2 1.1 1.0  PROT 7.5 7.4 6.6 6.9  ALBUMIN 3.0* 2.9* 2.5* 2.4*   Recent Labs  Lab 07/13/18 1622  LIPASE 66*   No results for input(s): AMMONIA in the last 168 hours.  CBC: Recent Labs  Lab 07/13/18 1136 07/14/18 0413 07/16/18 0828  WBC 6.2 5.5 7.4  NEUTROABS 3.8  --   --   HGB 16.8 17.6* 16.3  HCT 52.2* 53.8* 49.5  MCV 93.7 91.8 91.7  PLT 129* 103* 105*    Cardiac Enzymes: No results for input(s): CKTOTAL, CKMB, CKMBINDEX, TROPONINI in the last 168 hours.  BNP: BNP (last 3 results) No results for input(s): BNP in the last 8760 hours.  ProBNP (last 3 results) No results for input(s): PROBNP in the last 8760 hours.   CBG: Recent Labs  Lab 07/18/18 0838 07/18/18 1154 07/18/18 1817 07/18/18 2147 07/19/18 0809  GLUCAP 197* 224* 150* 90 123*    Coagulation Studies: No results for input(s): LABPROT, INR in the last 72 hours.   Imaging   Dg Chest Portable 1 View  Result Date: 07/18/2018 CLINICAL DATA:  Shortness of breath and dry cough for 3 weeks. Patient status post right thoracentesis 07/16/2018. EXAM: PORTABLE CHEST 1 VIEW COMPARISON:  Single-view of the chest 07/16/2018 and 07/13/2018. CT chest 07/15/2018. FINDINGS: Hazy opacities over the lung bases are compatible with layering pleural effusions, greater on the right. Right effusion has likely increased since the most recent examination. Calcified granuloma projecting in the right  lower lung zone is unchanged. There is cardiomegaly. Aortic atherosclerosis is seen. IMPRESSION: Some increase in hazy opacity over the lower right chest since the most recent examination compatible with increased pleural effusion and basilar airspace disease. Smaller left effusion and basilar airspace disease are unchanged. Cardiomegaly. Atherosclerosis. Electronically Signed   By: Inge Rise M.D.   On: 07/18/2018 11:21      Medications:     Current Medications: . benzonatate  100 mg Oral BID  . buPROPion  150 mg Oral Daily  . feeding supplement  1 Container Oral BID BM  . feeding supplement (PRO-STAT SUGAR FREE 64)  30 mL Oral BID WC  . folic acid  1 mg Oral Daily  . furosemide  40 mg Intravenous Once  . hydrocortisone   Rectal BID  . insulin aspart  0-15 Units Subcutaneous TID WC  . insulin aspart  0-5 Units Subcutaneous QHS  . insulin glargine  10 Units Subcutaneous QHS  . multivitamin with minerals  1 tablet Oral Daily  . ramelteon  8 mg Oral QHS  . thiamine  100 mg Oral Daily     Infusions:      Assessment/Plan  1. New Acute Biventricular HF- Suspect NICM ETOH abuse. Needs coronary angiography with but with elevated creatinine will not pursue at this time.  ECHO 6/10 LV Ef 15% RV severely reduced. Unable to obtain MRI with creatinine> 1.5. SPEP was negative for M Spike  No BB with possible low output HF given soft SBP and tachycardia. Need PICC versus RHC to further assess hemodynamics.   No arb/spiro/dig with elevated creatinine.    2. Acute on CKD , unknown baseline  Creatinine on admit 2.3 . Todays creatinine 2.1 Renel [place] negative for  hydronephrosis  3. Pleural Effusion -S/P R thoracentesis on 6/8  4. Cirrhosis -Noted on CT this admit  -AST/ALT trending up  5. ETOH abuse  6.. Cocaine Abuse  7.. Elevated Bilirubin  8. Possible Apical Thrombus  Dr Haroldine Laws will review ECHO and decide on IV heparin.   9. Diabetes.   10. RLE DVT   Length of  Stay: 6  Amy Clegg, NP  07/19/2018, 8:32 AM  Advanced Heart Failure Team Pager [phone] (M-F; 7a - 4p)  Please contact [facility] for night-coverage after hours (4p -7a ) and weekends on [email or web address]  Patient seen and examined with the above-signed Advanced Practice Provider and/or Housestaff. I personally reviewed laboratory data, imaging studies and relevant notes. I independently examined the patient and formulated the important aspects of the plan. I have edited the note to reflect any of my changes or salient points. I have personally discussed the plan with the patient and/or family.  68 y/o [place] Vet with h/o HTN, ETOH and polysubstance abuse (drinks 2 pints of Bourbon per day). Admitted with several months of progressive HF symptoms and weight loss. Now admitted with cardiogenic shock. Echo with EF 10-15% with moderate RV dysfunction an LV apical thrombus. W/u also notable for RLE DVT.  On exam General:  Ill appearing. Cachetic +dyspneic at rest HEENT: normal Neck: supple. JVP to jaw. Carotids 2+ bilat; no bruits. No lymphadenopathy or thryomegaly appreciated. Cor: PMI nondisplaced. Tachy reg +s3 Lungs: clear Abdomen: soft, nontender, nondistended. No hepatosplenomegaly. No bruits or masses. Good bowel sounds. Extremities: no cyanosis, clubbing, rash, 2+ edema cool Neuro: alert & orientedx3, cranial nerves grossly intact. moves all 4 extremities w/o difficulty. Affect pleasant  He is in cardiogenic shock. Suspect this is related to ETOH abuse but LV not as dilated as we typically see. Will move to [place]. Will need central access and inotropic support followed by diuresis. Start heparin. If creatinine improves will need R/L cath. CIWA protocol in place.  CRITICAL CARE Performed by: Glori Bickers  Total critical care time: 45 minutes  Critical care time was exclusive of separately billable procedures and treating other patients.  Critical care was necessary to treat or  prevent imminent or life-threatening deterioration.  Critical care was time spent personally by me (independent of midlevel providers or residents) on the following activities: development of treatment plan with patient and/or surrogate as well as nursing, discussions with consultants, evaluation of patient's response to treatment, examination of patient, obtaining history from patient or surrogate, ordering and performing treatments and interventions, ordering and review of laboratory studies, ordering and review of radiographic studies, pulse oximetry and re-evaluation of patient's condition.  Glori Bickers, MD  11:35 AM

## 2018-07-19 NOTE — Progress Notes (Signed)
  Echocardiogram 2D Echocardiogram has been performed.  Ryan [name] 07/19/2018, 10:35 AM

## 2018-07-19 NOTE — Progress Notes (Signed)
Subjective:  Ryan [name] is a 68 y.o. M with PMH of alcoholic cirrhosis, M7EH admit for unintentional weight loss on hospital day 6  Mr Kreider was examined at bedside and stated "Doc you need to help me breath." He had poor night of sleep due to subjective dyspnea and states no significant improvement despite furosemide dose yesterday. The results of his echocardiogram were explained to [facility] Upon further inquiry, he denies any prior hx of cardiac disease. He was unable to recall any prior hx of significant chest pain. He inquired if he would require subsequent thoracentesis but we explained that it was not necessary. He still endorse cough and lower extremity edema. He also mentions that he is continuing to endorse significant nausea. Informed him he has anti-emetics ordered as PRN.  Objective:  Vital signs in last 24 hours: Vitals:   07/18/18 0917 07/18/18 1429 07/18/18 1429 07/19/18 0612  BP:  (!) 114/93 (!) 114/93 99/70  Pulse:  (!) 118 (!) 103 (!) 105  Resp:   20 18  Temp:   (!) 97.5 F (36.4 C)   TempSrc:   Oral   SpO2:  100% 100% 97%  Weight: 70.1 kg     Height:       Physical Exam  Constitutional: He is oriented to person, place, and time.  Chronically ill-appearing  HENT:  Mouth/Throat: Oropharynx is clear and moist.  Eyes: Conjunctivae are normal.  Neck: Normal range of motion. Neck supple.  Cardiovascular: Regular rhythm, normal heart sounds and intact distal pulses.  No murmur heard. Tachycardic  Pulmonary/Chest: He has rales (bibasilar rales).  Abdominal: Soft. Bowel sounds are normal. He exhibits no distension. There is no abdominal tenderness.  Musculoskeletal: Normal range of motion.        General: Edema (1+ pitting edema lower extremities R>L) present.  Neurological: He is alert and oriented to person, place, and time.  Skin: Skin is warm and dry.   Assessment/Plan:  Principal Problem:   Weight loss, unintentional Active Problems:   Acute  kidney injury ([place])   History of alcohol use disorder   Proteinuria   Lactic acidosis   Cholestasis   Thrombocytopenia (HCC)   Alcoholic cirrhosis (HCC)   Hyperkalemia   Metabolic acidosis, normal anion gap (NAG)   Pleural effusion  Mr.Malik Patino is a 68 yo M w/ PMH of alcoholic cirrhosis and M0NO admit for AKI and unintentional weight loss. Mr.Trostle's echo yesterday showed significantly reduced biventricular heart failure w/ EF of <15% with possible thrombus in his left ventricle. He will require ischemic evaluation as well as better characterization of his ventricle finding. His severe heart failure help further explain his earlier lactic acidosis, subjective dyspnea and fatigue. We will bring heart failure on board for assistance.  Dyspnea 2/2 acute systolic heart failure X-ray 07/18/18: Worsening right pleural effusion. TTE: LVEF 70%, diastolic dysfunction, LV thrombus vs trabeculation, Biventricular failure w/ severe atrial dilatation. Mitral valve regurg moderate. Tricuspid valve regurg moderate-severe. Thoracentesis: 1.2L out from right lung 07/16/18. This AM 95% on RA but in respiratory distress on physical exam. Also showing pitting edema bilateral lower extremities R>L. + JVD. - Appreciate heart failure recs: PICC line, start milrinone, heparin drip for thrombus - F/u contrast echo - Keep O2 sat > 88  Lower extremity swelling 2/2 CHF + DVT Worsening lower extremity edema w/ R>L in size. 3 pts on Wells criteria. [place] LE: prelim-read show right DVT. - Heparin Gtt  UnintentionalWeight loss2/2 poor oral intakevs depression vsmalignancy 69.5kg ->  70kg -> 72.9kg since admission. His initial improvement may be in part due to retaining fluids w/ heart failure. PHQ-9 yesterday at 19 - moderate-severe depression.CT chest/abd/pelvisw/ large R sided pleural effusion concerning for malignancy vs hepatothorax.Thoracentesis performed 07/17/18 w/ 1.2 transudate effusion (protein <3,  LDH 123). Cytology shows reactive mesothelial cells. SPEP shows no M-spike.  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 K 5.0 this am. Renal function decline likely due to poor cardiac function. Continue to trend.Renal [place] small simple cysts, no hydronephrosis. Aldosterone/renin ratio pending. - F/u aldosterone/renin - Trend renal function - Avoid nephrotoxic meds  Hx of Cirrhosis: per chart review, hx of hepatitis C infection (confirmed via biopsy) s/p treatment, steatosis, and alcoholic cirrhosis from 29996[AST 148, ALT 102, Bili of 20.1]. No record since then. AST 3 ALT 19, Alk phos 232, T.bili 1.5.MELD score of 19. Child-Pugh score 9. Abd [place] cholelithiasis w/o obstruction. Cirrhotic liver with trace ascites. Right lobe hemangioma and left lobe lesion. Hep C RNA pending -F/uHep C RNA - Trend hepatic function - Need to follow up with hepatology outpatient for screening for transplant - If worsening liver function, start lactulose   EtOH use disorder: Drinks 1.5 pint bourbon daily but states last drink was 1.5 months ago. 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 - CIWA w/ ATIVAN - Folate, Thiamine, Vitamins  DVT prophx:Heparin Gtt Diet:2 gram sodium diet Bowel:Senokot PRN Code:DNR  Dispo: Anticipated discharge in approximately 3-4 day(s).   LMosetta Anis MD 07/19/2018, 6:41 AM Pager: [phone]

## 2018-07-19 NOTE — Progress Notes (Signed)
Pt unable to take Pro-Stat. States it makes him "throw up".

## 2018-07-19 NOTE — Progress Notes (Signed)
  Date: 07/19/2018  Patient name: Zamier Eggebrecht  Medical record number: [id number]  Date of birth: 01-07-1951   I have seen and evaluated this patient and I have discussed the plan of care with the house staff. Please see their note for complete details. I concur with their findings with the following additions/corrections:   Significant developments since yesterday include diagnosis of DVT in his right leg, confirmation of LV thrombus, and diagnosis of HFrEF with severely reduced EF of less than 10%.  We have consulted the heart failure team and appreciate their assistance with his management.  He has been transferred to [facility] and will be started on milrinone with central access.  Etiology of HFrEF is likely related to alcohol use, but at some point will need further evaluation.  On my exam this morning, he appeared breathless and could not complete sentences.  His extremities were warm and well-perfused when I saw him this morning, he had diminished breath sounds at the bases, mildly elevated JVP, and continued systolic murmur at the left sternal border and apex.  Lenice Pressman, M.D., Ph.D. 07/19/2018, 2:26 PM

## 2018-07-19 NOTE — Progress Notes (Signed)
ANTICOAGULATION CONSULT NOTE - Follow Up Consult  Pharmacy Consult for heparin Indication: LV thrombus and DVT  Labs: Recent Labs    07/18/18 0656 07/18/18 1601 07/19/18 0201 07/19/18 2240  HEPARINUNFRC  --   --   --  <0.10*  CREATININE 1.95* 2.23* 2.19*  --     Assessment: 68yo male subtherapeutic on heparin with initial dosing for LV thrombus and DVT; no gtt issues or signs of bleeding per RN.  Goal of Therapy:  Heparin level 0.3-0.7 units/ml   Plan:  Will rebolus with heparin 2000 units and increase heparin gtt by 4 units/kg/hr to 1500 units/hr and check level in 8 hours.    Wynona Neat, PharmD, BCPS  07/19/2018,11:48 PM

## 2018-07-19 NOTE — Progress Notes (Signed)
OT Cancellation Note  Patient Details Name: Ryan [name] MRN: [id number] DOB: April 09, 1950   Cancelled Treatment:    Reason Eval/Treat Not Completed: Medical issues which prohibited therapy  Kari Baars, OT Acute Rehabilitation Services [phone] Office- [phone], Edwena Felty D 07/19/2018, 2:16 PM

## 2018-07-19 NOTE — Progress Notes (Signed)
Nutrition Follow-up  DOCUMENTATION CODES:   Not applicable  INTERVENTION:   -D/c Prostat, due to poor acceptance -Continue MVI with minerals daily -Continue Boost Breeze po BID, each supplement provides 250 kcal and 9 grams of protein -Ensure Max po daily, each supplement provides 150 kcal and 30 grams of protein -Magic cup TID with meals, each supplement provides 290 kcal and 9 grams of protein  NUTRITION DIAGNOSIS:   Inadequate oral intake related to poor appetite as evidenced by meal completion < 25%.  Ongoing  GOAL:   Patient will meet greater than or equal to 90% of their needs  Progressing  MONITOR:   PO intake, Supplement acceptance, Skin, Labs, Weight trends, I & O's  REASON FOR ASSESSMENT:   Consult Poor PO  ASSESSMENT:   68 yo male w/ a PMHx notable for alcohol use disorder, substance abuse, cirrhosis, Hep C, diabetes who presents with wt loss, dysphaiga, nausea and abdominal pain x 2 months.   6/8- s/p thoaracentesis  Reviewed I/O's: +255 ml x 24 hours and +4.1 L since admission  UOP: 325 ml x 24 hours  Pt evaluated by Advanced Heart Failure Team and found to have new acute biventricular heart failure; pt will need coronary angiography, but will not pursue at this time due to elevated creatinine.   Spoke with at bedside, who was drowsy and did not interact much with this RD. He reports feeling poorly today. Visit was cut short due to pt requesting to use bathroom.   Pt remains with variable intake; PO: 0-70%. Noted Boost Breeze on tray table. Pt was refusing Prostat supplements, as he experienced emesis from taking prior dose yesterday.   Unable to perform nutrition-focused physical exam, due to visit being cut short, however, noted at least mild muscle depletion in lower extremities and at lease mild to moderate fat depletion in face. Highly suspect pt with malnutrition, however, unable to identify at this time.   Labs reviewed: CBGS: 90-150 (inpatient  orders for glycemic control are 0-15 units insulin aspart TID with meals, 0-5 units insulin aspart q HS, and 10 units insulin glargine q HS).   Diet Order:   Diet Order            DIET DYS 3 Room service appropriate? Yes; Fluid consistency: Thin  Diet effective now              EDUCATION NEEDS:   No education needs have been identified at this time  Skin:  Skin Assessment: Reviewed RN Assessment  Last BM:  07/19/18  Height:   Ht Readings from Last 1 Encounters:  07/19/18 5\' 8"  (1.727 m)    Weight:   Wt Readings from Last 1 Encounters:  07/19/18 72.9 kg    Ideal Body Weight:  70 kg  BMI:  Body mass index is 24.44 kg/m.  Estimated Nutritional Needs:   Kcal:  2100-2300  Protein:  110-125 grams  Fluid:  > 2.1 L    Doneisha Ivey A. Jimmye Norman, RD, LDN, [place] Registered Dietitian II Certified Diabetes Care and Education Specialist Pager: [phone] After hours Pager: [phone]

## 2018-07-19 NOTE — Progress Notes (Signed)
Bilateral lower extremity venous duplex has been completed. Preliminary results can be found in CV Proc through chart review.  Results were given to the patient's nurse, Mendel Ryder, and Dr. Haroldine Laws.   07/19/18 10:48 AM Ryan [name] RVT

## 2018-07-19 NOTE — Progress Notes (Signed)
Peripherally Inserted Central Catheter/Midline Placement  The IV Nurse has discussed with the patient and/or persons authorized to consent for the patient, the purpose of this procedure and the potential benefits and risks involved with this procedure.  The benefits include less needle sticks, lab draws from the catheter, and the patient may be discharged home with the catheter. Risks include, but not limited to, infection, bleeding, blood clot (thrombus formation), and puncture of an artery; nerve damage and irregular heartbeat and possibility to perform a PICC exchange if needed/ordered by physician.  Alternatives to this procedure were also discussed.  Bard Power PICC patient education guide, fact sheet on infection prevention and patient information card has been provided to patient /or left at bedside.    PICC/Midline Placement Documentation  PICC Triple Lumen 07/19/18 PICC Right Brachial 38 cm 0 cm (Active)  Indication for Insertion or Continuance of Line Chronic illness with exacerbations (CF, Sickle Cell, etc.) 07/19/18 1504  Exposed Catheter (cm) 0 cm 07/19/18 1504  Site Assessment Clean;Dry;Intact 07/19/18 1504  Lumen #1 Status Flushed;Saline locked;Blood return noted 07/19/18 1504  Lumen #2 Status Flushed;Saline locked;Blood return noted 07/19/18 1504  Lumen #3 Status Flushed;Saline locked;Blood return noted 07/19/18 1504  Dressing Type Transparent 07/19/18 1504  Dressing Status Clean;Dry;Intact;Antimicrobial disc in place 07/19/18 1504  Dressing Intervention New dressing 07/19/18 1504  Dressing Change Due 07/26/18 07/19/18 1504       Jebadiah Imperato, Nicolette Bang 07/19/2018, 3:05 PM

## 2018-07-19 NOTE — Progress Notes (Signed)
ANTICOAGULATION CONSULT NOTE - Initial Consult  Pharmacy Consult for heparin Indication: DVT and LV apical thrombus  Allergies  Allergen Reactions  . Ace Inhibitors Cough    Patient Measurements: Height: 5\' 8"  (172.7 cm) Weight: 154 lb 8.7 oz (70.1 kg) IBW/kg (Calculated) : 68.4 Heparin Dosing Weight: 70.1 kg  Vital Signs: Temp: 97.5 F (36.4 C) (06/11 0612) Temp Source: Oral (06/11 0612) BP: 99/70 (06/11 0612) Pulse Rate: 116 (06/11 1110)  Labs: Recent Labs    07/18/18 0656 07/18/18 1601 07/19/18 0201  CREATININE 1.95* 2.23* 2.19*    Estimated Creatinine Clearance: 30.8 mL/min (A) (by C-G formula based on SCr of 2.19 mg/dL (H)).   Medical History: History reviewed. No pertinent past medical history.  Medications:  Scheduled:  . benzonatate  100 mg Oral BID  . buPROPion  150 mg Oral Daily  . feeding supplement  1 Container Oral BID BM  . folic acid  1 mg Oral Daily  . hydrocortisone   Rectal BID  . insulin aspart  0-15 Units Subcutaneous TID WC  . insulin aspart  0-5 Units Subcutaneous QHS  . insulin glargine  10 Units Subcutaneous QHS  . multivitamin with minerals  1 tablet Oral Daily  . Ensure Max Protein  11 oz Oral Daily  . ramelteon  8 mg Oral QHS  . thiamine  100 mg Oral Daily   Infusions:    Assessment: 57 yom presenting with increased dyspnea, fatigue, and abdominal pain s/p 60 lb weight loss over last 2 months. ECHO was completed showing possible LV thrombus. Additionally, duplex showing acute R DVT. Rx consulted for heparin dosing.  Last dose enoxaparin on 6/9. Last CBC on 6/8 showing Hgb 16.3, plt 105. No s/sx of bleeding.   Goal of Therapy:  Heparin level 0.3-0.7 units/ml Monitor platelets by anticoagulation protocol: Yes   Plan:  Plan to start heparin infusion once PICC placed Give 3600 units bolus x 1 Start heparin infusion at 1150 units/hr Check anti-Xa level in 6 hours and daily while on heparin Continue to monitor H&H and  platelets  Antonietta Jewel, PharmD, [facility] Clinical Pharmacist  Pager: [phone] Phone: [phone] 07/19/2018,12:23 PM

## 2018-07-19 NOTE — Progress Notes (Signed)
Pt in stable condition, transferred to 2C02. Report given to Arbie Cookey, Therapist, sports.

## 2018-07-20 DIAGNOSIS — F331 Major depressive disorder, recurrent, moderate: Secondary | ICD-10-CM

## 2018-07-20 LAB — COOXEMETRY PANEL
Carboxyhemoglobin: 1 % (ref 0.5–1.5)
Carboxyhemoglobin: 0.8 % (ref 0.5–1.5)
Methemoglobin: 0.9 % (ref 0.0–1.5)
Methemoglobin: 0.8 % (ref 0.0–1.5)
O2 Saturation: 43.1 %
O2 Saturation: 51.9 %
Total hemoglobin: 14.3 g/dL (ref 12.0–16.0)
Total hemoglobin: 14.2 g/dL (ref 12.0–16.0)

## 2018-07-20 LAB — BASIC METABOLIC PANEL
Anion gap: 8 (ref 5–15)
BUN: 56 mg/dL — ABNORMAL HIGH (ref 8–23)
CO2: 21 mmol/L — ABNORMAL LOW (ref 22–32)
Calcium: 8.7 mg/dL — ABNORMAL LOW (ref 8.9–10.3)
Chloride: 110 mmol/L (ref 98–111)
Creatinine, Ser: 2.39 mg/dL — ABNORMAL HIGH (ref 0.61–1.24)
GFR calc Af Amer: 31 mL/min — ABNORMAL LOW (ref >60)
GFR calc non Af Amer: 27 mL/min — ABNORMAL LOW (ref >60)
Glucose, Bld: 110 mg/dL — ABNORMAL HIGH (ref 70–99)
Potassium: 4.3 mmol/L (ref 3.5–5.1)
Sodium: 139 mmol/L (ref 135–145)

## 2018-07-20 LAB — CBC
HCT: 41.9 % (ref 39.0–52.0)
Hemoglobin: 13.8 g/dL (ref 13.0–17.0)
MCH: 29.7 pg (ref 26.0–34.0)
MCHC: 32.9 g/dL (ref 30.0–36.0)
MCV: 90.3 fL (ref 80.0–100.0)
Platelets: 142 10*3/uL — ABNORMAL LOW (ref 150–400)
RBC: 4.64 MIL/uL (ref 4.22–5.81)
RDW: 13.8 % (ref 11.5–15.5)
WBC: 6.9 10*3/uL (ref 4.0–10.5)
nRBC: 0 % (ref 0.0–0.2)

## 2018-07-20 LAB — GLUCOSE, CAPILLARY
Glucose-Capillary: 84 mg/dL (ref 70–99)
Glucose-Capillary: 130 mg/dL — ABNORMAL HIGH (ref 70–99)
Glucose-Capillary: 86 mg/dL (ref 70–99)
Glucose-Capillary: 88 mg/dL (ref 70–99)

## 2018-07-20 LAB — IRON AND TIBC
Iron: 22 ug/dL — ABNORMAL LOW (ref 45–182)
Saturation Ratios: 10 % — ABNORMAL LOW (ref 17.9–39.5)
TIBC: 228 ug/dL — ABNORMAL LOW (ref 250–450)
UIBC: 206 ug/dL

## 2018-07-20 LAB — HEPARIN LEVEL (UNFRACTIONATED)
Heparin Unfractionated: 0.41 IU/mL (ref 0.30–0.70)
Heparin Unfractionated: 0.92 IU/mL — ABNORMAL HIGH (ref 0.30–0.70)

## 2018-07-20 LAB — FERRITIN: Ferritin: 80 ng/mL (ref 24–336)

## 2018-07-20 MED ORDER — MILRINONE LACTATE IN DEXTROSE 20-5 MG/100ML-% IV SOLN
0.1250 ug/kg/min | INTRAVENOUS | Status: DC
  Administered 2018-07-20 – 2018-07-25 (×8): 0.375 ug/kg/min via INTRAVENOUS
  Administered 2018-07-26: 0.25 ug/kg/min via INTRAVENOUS
  Administered 2018-07-28 – 2018-07-30 (×2): 0.125 ug/kg/min via INTRAVENOUS
  Filled 2018-07-20 (×16): qty 100

## 2018-07-20 MED ORDER — ISOSORB DINITRATE-HYDRALAZINE 20-37.5 MG PO TABS
0.5000 | ORAL_TABLET | Freq: Three times a day (TID) | ORAL | Status: DC
  Administered 2018-07-20 – 2018-07-21 (×4): 0.5 via ORAL
  Filled 2018-07-20 (×4): qty 1

## 2018-07-20 NOTE — Progress Notes (Signed)
PT Cancellation Note  Patient Details Name: Alphonza Tramell MRN: [id number] DOB: 11/23/1950   Cancelled Treatment:     PT cancelled due to medical complications.  Please advise when pt able to tolerate out of bed activities.   Loyal Buba 07/20/2018, 1:16 PM

## 2018-07-20 NOTE — TOC Benefit Eligibility Note (Signed)
Transition of Care [facility]) Benefit Eligibility Note    Patient Details  Name: Ryan [name] MRN: [id number] Date of Birth: January 07, 1951                           Additional Notes: NO PHARMACY BENEFITS ON FILE /  [name]    [name] Phone Number: 07/20/2018, 5:03 PM

## 2018-07-20 NOTE — Progress Notes (Signed)
  Date: 07/20/2018  Patient name: Ryan [name]  Medical record number: [id number]  Date of birth: 21-Feb-1950   I have seen and evaluated this patient and I have discussed the plan of care with the house staff. Please see their note for complete details. I concur with their findings with the following additions/corrections:   68 year old [place] veteran admitted for progressive weakness, dyspnea, and cough.  Diagnosed with new severe HFrEF, LV thrombus, DVT, bilateral pleural effusions, alcoholic cirrhosis, and depression.  Doing well on milrinone drip.  Shortness of breath has improved significantly.  Did have an episode of confusion yesterday and seems mildly off today, but able to carry on a fairly normal conversation.  1.  New diagnosis HFrEF, LV thrombus -LVEF quite low at 10 to 15%, biventricular failure, likely due to alcohol, but uncontrolled hypertension may have played a role, ventricles not as dilated as would be expected for alcohol induced cardiomyopathy -Cardiogenic shock is improving -Appreciate consultation from Dr. Haroldine Laws and the heart failure team -Continue milrinone, titrating up dosage as his central venous O2 sat remains quite low in the 40s -Continuing IV diuresis -Adding BiDil -If renal function allows, will need further evaluation with catheterization and/or MRI -Continue IV heparin for LV thrombus -Ferritin is less than 100, no evidence for hemochromatosis, may benefit from IV iron prior to discharge  2.  Possible acute kidney injury on CKD, unknown baseline -Continue treating HFrEF with a low output as above to increase perfusion of the kidneys -Still awaiting aldosterone and renin levels  3.  Unintentional weight loss: Likely multifactorial with multiple severe, progressive medical issues as well as depression, suspect severe protein calorie malnutrition but unable to confirm prior weights -Continue nutritional supplementation -Continue bupropion for MDD   4.  Right leg DVT -Continue IV heparin as above, will need to transition to warfarin at some point  5.  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  Cirrhosis: Likely alcoholic cirrhosis, MELD 19 on admission, no acute treatment needed  Lenice Pressman, M.D., Ph.D. 07/20/2018, 2:34 PM

## 2018-07-20 NOTE — Progress Notes (Signed)
Advanced Heart Failure Rounding Note   Subjective:    Started on milrinone yesterday for co-ox 45%. Now on milrinone 0.25. feels much better. SOB improved. No orthopnea or PND.   Remains tachycardiac. Co-ox 43%. Good diuresis. Weight down 13 pounds. CVP 8-9. Creatinine up slightly to 2.39  On heparin. No bleeding.  Objective:   Weight Range:  Vital Signs:   Temp:  [97 F (36.1 C)-97.6 F (36.4 C)] 97.5 F (36.4 C) (06/12 1038) Pulse Rate:  [56-117] 114 (06/12 0701) Resp:  [17-34] 27 (06/12 1038) BP: (92-135)/(76-101) 99/76 (06/12 1038) SpO2:  [83 %-100 %] 99 % (06/12 0701) Weight:  [66.9 kg-72.9 kg] 66.9 kg (06/12 0500) Last BM Date: 07/19/18  Weight change: Filed Weights   07/19/18 1000 07/19/18 1344 07/20/18 0500  Weight: 70.1 kg 72.9 kg 66.9 kg    Intake/Output:   Intake/Output Summary (Last 24 hours) at 07/20/2018 1129 Last data filed at 07/20/2018 0900 Gross per 24 hour  Intake 1311.41 ml  Output 2750 ml  Net -1438.59 ml     Physical Exam: General:  Weak appearing. Cachetic No resp difficulty HEENT: normal Neck: supple. JVP 8 . Carotids 2+ bilat; no bruits. No lymphadenopathy or thryomegaly appreciated. Cor: PMI nondisplaced. Tachy regular + s3 2/6 MR Lungs: clear Abdomen: soft, nontender, nondistended. No hepatosplenomegaly. No bruits or masses. Good bowel sounds. Extremities: no cyanosis, clubbing, rash, trace edema now warm  Neuro: alert & orientedx3, cranial nerves grossly intact. moves all 4 extremities w/o difficulty. Affect pleasant  Telemetry: Sinus tach 110-120 Personally reviewed   Labs: Basic Metabolic Panel: Recent Labs  Lab 07/14/18 1231  07/17/18 0209 07/18/18 0656 07/18/18 1601 07/19/18 0201 07/20/18 0520  NA  --    < > 139 140 138 138 139  K  --    < > 4.5 5.7* 4.8 5.0 4.3  CL  --    < > 112* 109 111 110 110  CO2  --    < > 18* 18* 16* 17* 21*  GLUCOSE  --    < > 171* 140* 171* 125* 110*  BUN  --    < > 48* 48* 56* 52* 56*   CREATININE  --    < > 1.91* 1.95* 2.23* 2.19* 2.39*  CALCIUM  --    < > 8.8* 9.0 9.0 9.0 8.7*  PHOS 4.2  --   --   --   --   --   --    < > = values in this interval not displayed.    Liver Function Tests: Recent Labs  Lab 07/13/18 1136 07/14/18 0413 07/16/18 0216 07/17/18 0209  AST 31 39 79* 63*  ALT 19 20 54* 49*  ALKPHOS 232* 272* 437* 418*  BILITOT 1.5* 1.2 1.1 1.0  PROT 7.5 7.4 6.6 6.9  ALBUMIN 3.0* 2.9* 2.5* 2.4*   Recent Labs  Lab 07/13/18 1622  LIPASE 66*   No results for input(s): AMMONIA in the last 168 hours.  CBC: Recent Labs  Lab 07/13/18 1136 07/14/18 0413 07/16/18 0828 07/20/18 0520  WBC 6.2 5.5 7.4 6.9  NEUTROABS 3.8  --   --   --   HGB 16.8 17.6* 16.3 13.8  HCT 52.2* 53.8* 49.5 41.9  MCV 93.7 91.8 91.7 90.3  PLT 129* 103* 105* 142*    Cardiac Enzymes: No results for input(s): CKTOTAL, CKMB, CKMBINDEX, TROPONINI in the last 168 hours.  BNP: BNP (last 3 results) No results for input(s): BNP in the  last 8760 hours.  ProBNP (last 3 results) No results for input(s): PROBNP in the last 8760 hours.    Other results:  Imaging: Vas [place] Lower Extremity Venous (dvt)  Result Date: 07/19/2018  Lower Venous Study Indications: Pulmonary embolism.  Comparison Study: No prior studies. Performing Technologist: Oliver Hum RVT  Examination Guidelines: A complete evaluation includes B-mode imaging, spectral Doppler, color Doppler, and power Doppler as needed of all accessible portions of each vessel. Bilateral testing is considered an integral part of a complete examination. Limited examinations for reoccurring indications may be performed as noted.  +---------+---------------+---------+-----------+----------+-------+  RIGHT     Compressibility Phasicity Spontaneity Properties Summary  +---------+---------------+---------+-----------+----------+-------+  CFV       Full            No        No                               +---------+---------------+---------+-----------+----------+-------+  SFJ       Full                                                      +---------+---------------+---------+-----------+----------+-------+  FV Prox   None            No        No                     Acute    +---------+---------------+---------+-----------+----------+-------+  FV Mid    None            No        No                     Acute    +---------+---------------+---------+-----------+----------+-------+  FV Distal None            No        No                     Acute    +---------+---------------+---------+-----------+----------+-------+  PFV       Full                                                      +---------+---------------+---------+-----------+----------+-------+  POP       None            No        No                     Acute    +---------+---------------+---------+-----------+----------+-------+  PTV       None                                             Acute    +---------+---------------+---------+-----------+----------+-------+  PERO      None  Acute    +---------+---------------+---------+-----------+----------+-------+  Gastroc   None                                             Acute    +---------+---------------+---------+-----------+----------+-------+   +---------+---------------+---------+-----------+----------+-------+  LEFT      Compressibility Phasicity Spontaneity Properties Summary  +---------+---------------+---------+-----------+----------+-------+  CFV       Full            No        No                              +---------+---------------+---------+-----------+----------+-------+  SFJ       Full                                                      +---------+---------------+---------+-----------+----------+-------+  FV Prox   Full                                                      +---------+---------------+---------+-----------+----------+-------+  FV Mid    Full                                                       +---------+---------------+---------+-----------+----------+-------+  FV Distal Full                                                      +---------+---------------+---------+-----------+----------+-------+  PFV       Full                                                      +---------+---------------+---------+-----------+----------+-------+  POP       Full            No        No                              +---------+---------------+---------+-----------+----------+-------+  PTV       Full                                                      +---------+---------------+---------+-----------+----------+-------+  PERO      Full                                                      +---------+---------------+---------+-----------+----------+-------+  Summary: Right: Findings consistent with acute deep vein thrombosis involving the right femoral vein, right popliteal vein, right posterior tibial vein, right peroneal vein, and right gastrocnemius vein. No cystic structure found in the popliteal fossa. Left: There is no evidence of deep vein thrombosis in the lower extremity. No cystic structure found in the popliteal fossa.  *See table(s) above for measurements and observations. Electronically signed by Servando Snare MD on 07/19/2018 at 5:33:50 PM.    Final    [place] Ekg Site Rite  Result Date: 07/19/2018 If Site Rite image not attached, placement could not be confirmed due to current cardiac rhythm.     Medications:     Scheduled Medications:  benzonatate  100 mg Oral BID   buPROPion  150 mg Oral Daily   feeding supplement  1 Container Oral BID BM   folic acid  1 mg Oral Daily   furosemide  80 mg Intravenous BID   hydrocortisone   Rectal BID   insulin aspart  0-15 Units Subcutaneous TID WC   insulin aspart  0-5 Units Subcutaneous QHS   insulin glargine  10 Units Subcutaneous QHS   isosorbide-hydrALAZINE  0.5 tablet Oral TID    multivitamin with minerals  1 tablet Oral Daily   Ensure Max Protein  11 oz Oral Daily   ramelteon  8 mg Oral QHS   sodium chloride flush  10-40 mL Intracatheter Q12H   thiamine  100 mg Oral Daily     Infusions:  heparin 1,500 Units/hr (07/20/18 0740)   milrinone 0.375 mcg/kg/min (07/20/18 0737)     PRN Medications:  acetaminophen **OR** acetaminophen, ondansetron **OR** ondansetron (ZOFRAN) IV, senna-docusate, sodium chloride flush   Assessment/Plan:   1. Acute biventricular systolic HF-> cardiogenic shock  - Suspect NICM due to HTN +/- ETOH (LV not overly dilated LVIDd 32mm)  - Initial co-ox 45%. Now on milrinone 0.25  - Symptomatically much improved with good diuresis but co-ox remains 43% - Increase milrinone to 0.375 - Add bidil 0.5 tab tid - Volumes status improving but CVP still 8-9. Continue IV lasix one more day - No digoxin with CKD - Needs coronary angiography if renal function permits - Unable to obtain MRI with creatinine> 1.5. - Not candidate for advanced therapies with social hx  2. Acute on CKD , unknown baseline  - Creatinine 1.95 -> 2.23 -> 2/19 -> 2.39 - Renal US negative for hydronephrosis  3. LV Apical Thrombus  - continue heparin. Will eventually need transition to warfarin   4. RLE DVT - continue heparin. Will eventually need transition to warfarin   5. Pleural Effusion -S/P R thoracentesis on 6/8  5. Cirrhosis -Noted on CT this admit  -AST/ALT trending up  6. ETOH abuse - Drinks 2 pints of bourbon/day - On CIWA protocol per primary team   7. Cocaine Abuse  8. DM2   Length of Stay: 7   Glori Bickers MD 07/20/2018, 11:29 AM  Advanced Heart Failure Team Pager [phone] (M-F; [place])  Please contact [facility] for night-coverage after hours (4p -7a ) and weekends on [email or web address]

## 2018-07-20 NOTE — Progress Notes (Addendum)
ANTICOAGULATION CONSULT NOTE - Follow Up Consult  Pharmacy Consult for heparin Indication: LV thrombus and DVT   HEPARIN DW (KG): 72.9  Labs: Recent Labs    07/18/18 1601 07/19/18 0201 07/19/18 2240 07/20/18 0520 07/20/18 0521  HGB  --   --   --  13.8  --   HCT  --   --   --  41.9  --   PLT  --   --   --  142*  --   HEPARINUNFRC  --   --  <0.10*  --  0.41  CREATININE 2.23* 2.19*  --  2.39*  --     Assessment: 68yo male subtherapeutic on heparin with initial dosing for LV thrombus and DVT.  Heparin level therapeutic at 0.41 on heparin 1500 units/hr. Hgb 13.8. No signs/symptoms of bleeding or issues with infusion reported.  Goal of Therapy:  Heparin level 0.3-0.7 units/ml   Plan:  Continue heparin 1500 units/hr Confirm HL in 8 hours  Claiborne Billings, PharmD PGY2 Cardiology Pharmacy Resident Please check AMION for all Pharmacist numbers by unit 07/20/2018 8:03 AM    Addendum: Confirmatory HL supra therapeutic at 0.92 on heparin 1500 units/hr. HL drew from PICC, heparin infusing through a peripheral. No signs/symptoms of bleeding. Will decrease heparin gtt to 1350 units/hr. Heparin level in 8 hours.   Claiborne Billings, PharmD PGY2 Cardiology Pharmacy Resident Please check AMION for all Pharmacist numbers by unit 07/20/2018 2:57 PM

## 2018-07-20 NOTE — Progress Notes (Addendum)
Subjective:  Ryan [name] is a 68 y.o. M with PMH of alcoholic cirrhosis, X4JO admit for weakness on hospital day 7   Ryan [name] was examined at bedside this morning with nursing staff present. He reports that he slept well yesterday. He reports of improvement with dyspnea. We inquired more about his episode of confusion yesterday and describes it as a "strange" experience. He endorses resolution to confusion. He was concerned about his long term prognosis but we offered precise description of management. He denies any hematuria, melena, hematemesis or other sources of bleeding. Continues to endorse a dry cough. He was nauseated yesterday but denies emesis. He is averaging about 2 bowel movements per day   Objective:  Vital signs in last 24 hours: Vitals:   07/19/18 2030 07/19/18 2300 07/20/18 0312 07/20/18 0500  BP: (!) 118/96 (!) 112/92 (!) 112/93   Pulse:  (!) 110 73   Resp: 18 (!) 24 (!) 21   Temp:  97.6 F (36.4 C) 97.6 F (36.4 C)   TempSrc:  Oral Oral   SpO2:  100% 99%   Weight:    66.9 kg  Height:       Gen: chronically ill-appearing HEENT: Temporal wasting, EOMI, PERRL Neck: supple, ROM intact, + JVD CV: RRR, S1, S2 normal, systolic murmur Pulm: Bibasilar rales R>L Abd: Soft, BS+, NTND, No rebound, no guarding Extm: ROM intact, Peripheral pulses intact, trace pitting edema around bilateral ankles Skin: Dry, Warm, no jaundice Neuro: AAOx3   Assessment/Plan:  Principal Problem:   Weight loss, unintentional Active Problems:   Acute kidney injury (HCC)   History of alcohol use disorder   Proteinuria   Lactic acidosis   Cholestasis   Thrombocytopenia (HCC)   Alcoholic cirrhosis (HCC)   Hyperkalemia   Metabolic acidosis, normal anion gap (NAG)   Pleural effusion   Acute HFrEF (heart failure with reduced ejection fraction) ([place])  Ryan [name] is a 68 yo M w/ PMH of alcoholic cirrhosis and I7OM admit for weakness 2/2 cardiogenic shock.Ryan [name] shows  significant improvement in respiratory status on milrinone despite Cooxemtery panel showing O2 sat in 43. Continuing to treat dyspnea and fatigue. He will need a beta-blocker and Ace-I/ARB to his med regimen but currently has soft bps in 98/73. Will defer to heart failure for management. Episode of confusion yesterday concerning for hepatic encephalopathy vs stroke. No asterixis on exam. Currently on heparin. Will continue to monitor.  Dyspnea Low output heart failure Left ventricular thrombus X-ray 07/18/18: Worsening right pleural effusion.  TTE: LVEF 76%, diastolic dysfunction, LV thrombus, Biventricular failure w/ severe atrial dilatation. Mitral valve regurg moderate. Tricuspid valve regurg moderate-severe. Likely 2/2 alcoholic cardiomyopathy. Thoracentesis: 1.2L out from right lung6/8/20. This AM95% on RA but in respiratory distress on physical exam. Also showing pitting edema bilateral lower extremities R>L. + JVD. - Appreciate heart failure recs: increase milirinone, start Bidil, IV furosemide - Keep O2 sat > 88  R Lower extremity swelling 2/2 DVT Worsening lower extremity edema w/ R>L in size. 3 pts on Wells criteria. [place] LE confirm right DVT. - Heparin Gtt  UnintentionalWeight loss poor oral intake Depression ?malignancy 69.5kg -> 70kg -> 72.9kg since admission. His initial improvement may be in part due to retaining fluids w/ heart failure. PHQ-9 at 19 - moderate-severe depression on admission.CT chest/abd/pelvisw/ large R sided pleural effusion concerning for malignancy vs hepatothorax.Thoracentesis performed 07/17/18 w/ 1.2 transudate effusion (protein <3, LDH 123). Cytologyshows reactive mesothelial cells. SPEP shows no M-spike. 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 Continue to trend.Renal US: small simple cysts, no hydronephrosis. Aldosterone/renin ratio pending. - Trend renal function - Avoid nephrotoxic meds  Hx of Cirrhosis: per chart review, hx of hepatitis C infection (confirmed via biopsy) s/p treatment, steatosis, and alcoholic cirrhosis from 7159 [AST 148, ALT 102, Bili of 20.1]. No record since then. AST 3 ALT 19, Alk phos 232, T.bili 1.5.MELD score of 19. Child-Pugh score 9.Abd [place]: cholelithiasis w/o obstruction. Cirrhotic liver with trace ascites. Right lobe hemangioma and left lobe lesion. Hep C RNA undetectable - Need to follow up with hepatology outpatient for screening for transplant - If worsening liver function, start lactulose   DVT prophx:Heparin Gtt Diet:2 gram sodium diet Bowel:Senokot PRN Code:DNR  Dispo: Anticipated discharge in approximately 3-4 day(s).   [name], MD 07/20/2018, 6:40 AM Pager: [phone]

## 2018-07-20 NOTE — Care Management Important Message (Signed)
Important Message  Patient Details  Name: Ryan [name] MRN: [id number] Date of Birth: 1950-09-12   Medicare Important Message Given:  Yes    [name] 07/20/2018, 1:12 PM

## 2018-07-20 NOTE — Plan of Care (Signed)
  Problem: Clinical Measurements: ?Goal: Will remain free from infection ?Outcome: Progressing ?Goal: Diagnostic test results will improve ?Outcome: Progressing ?Goal: Respiratory complications will improve ?Outcome: Progressing ?Goal: Cardiovascular complication will be avoided ?Outcome: Progressing ?  ?Problem: Pain Managment: ?Goal: General experience of comfort will improve ?Outcome: Progressing ?  ?Problem: Safety: ?Goal: Ability to remain free from injury will improve ?Outcome: Progressing ?  ?

## 2018-07-21 DIAGNOSIS — R570 Cardiogenic shock: Secondary | ICD-10-CM

## 2018-07-21 DIAGNOSIS — N179 Acute kidney failure, unspecified: Principal | ICD-10-CM

## 2018-07-21 DIAGNOSIS — N189 Chronic kidney disease, unspecified: Secondary | ICD-10-CM

## 2018-07-21 LAB — CBC
HCT: 38.6 % — ABNORMAL LOW (ref 39.0–52.0)
Hemoglobin: 12.9 g/dL — ABNORMAL LOW (ref 13.0–17.0)
MCH: 29.8 pg (ref 26.0–34.0)
MCHC: 33.4 g/dL (ref 30.0–36.0)
MCV: 89.1 fL (ref 80.0–100.0)
Platelets: 150 10*3/uL (ref 150–400)
RBC: 4.33 MIL/uL (ref 4.22–5.81)
RDW: 13.6 % (ref 11.5–15.5)
WBC: 6.7 10*3/uL (ref 4.0–10.5)
nRBC: 0 % (ref 0.0–0.2)

## 2018-07-21 LAB — COOXEMETRY PANEL
Carboxyhemoglobin: 1.6 % — ABNORMAL HIGH (ref 0.5–1.5)
Methemoglobin: 0.8 % (ref 0.0–1.5)
O2 Saturation: 74 %
Total hemoglobin: 13.5 g/dL (ref 12.0–16.0)

## 2018-07-21 LAB — CULTURE, BODY FLUID W GRAM STAIN -BOTTLE: Culture: NO GROWTH

## 2018-07-21 LAB — MAGNESIUM: Magnesium: 2 mg/dL (ref 1.7–2.4)

## 2018-07-21 LAB — BASIC METABOLIC PANEL
Anion gap: 9 (ref 5–15)
BUN: 56 mg/dL — ABNORMAL HIGH (ref 8–23)
CO2: 22 mmol/L (ref 22–32)
Calcium: 8.3 mg/dL — ABNORMAL LOW (ref 8.9–10.3)
Chloride: 104 mmol/L (ref 98–111)
Creatinine, Ser: 2.64 mg/dL — ABNORMAL HIGH (ref 0.61–1.24)
GFR calc Af Amer: 27 mL/min — ABNORMAL LOW (ref >60)
GFR calc non Af Amer: 24 mL/min — ABNORMAL LOW (ref >60)
Glucose, Bld: 71 mg/dL (ref 70–99)
Potassium: 3.6 mmol/L (ref 3.5–5.1)
Sodium: 135 mmol/L (ref 135–145)

## 2018-07-21 LAB — GLUCOSE, CAPILLARY
Glucose-Capillary: 60 mg/dL — ABNORMAL LOW (ref 70–99)
Glucose-Capillary: 76 mg/dL (ref 70–99)
Glucose-Capillary: 251 mg/dL — ABNORMAL HIGH (ref 70–99)
Glucose-Capillary: 77 mg/dL (ref 70–99)
Glucose-Capillary: 86 mg/dL (ref 70–99)

## 2018-07-21 LAB — HEPARIN LEVEL (UNFRACTIONATED)
Heparin Unfractionated: 0.9 IU/mL — ABNORMAL HIGH (ref 0.30–0.70)
Heparin Unfractionated: 0.91 IU/mL — ABNORMAL HIGH (ref 0.30–0.70)

## 2018-07-21 LAB — URIC ACID: Uric Acid, Serum: 13.7 mg/dL — ABNORMAL HIGH (ref 3.7–8.6)

## 2018-07-21 MED ORDER — POTASSIUM CHLORIDE CRYS ER 20 MEQ PO TBCR
40.0000 meq | EXTENDED_RELEASE_TABLET | Freq: Once | ORAL | Status: AC
  Administered 2018-07-21: 13:00:00 40 meq via ORAL
  Filled 2018-07-21: qty 2

## 2018-07-21 MED ORDER — PREDNISONE 20 MG PO TABS
40.0000 mg | ORAL_TABLET | Freq: Every day | ORAL | Status: DC
  Administered 2018-07-22: 40 mg via ORAL
  Filled 2018-07-21: qty 2

## 2018-07-21 MED ORDER — METOLAZONE 2.5 MG PO TABS
2.5000 mg | ORAL_TABLET | Freq: Once | ORAL | Status: AC
  Administered 2018-07-21: 2.5 mg via ORAL
  Filled 2018-07-21: qty 1

## 2018-07-21 MED ORDER — FUROSEMIDE 10 MG/ML IJ SOLN
80.0000 mg | Freq: Two times a day (BID) | INTRAMUSCULAR | Status: DC
  Administered 2018-07-21: 80 mg via INTRAVENOUS
  Filled 2018-07-21: qty 8

## 2018-07-21 NOTE — Progress Notes (Signed)
Orthopedic Tech Progress Note Patient Details:  Ryan [name] 1950/04/11 [id number]  Ortho Devices Type of Ortho Device: Haematologist Ortho Device/Splint Location: Bilateral unna boots Ortho Device/Splint Interventions: Application   Post Interventions Patient Tolerated: Well Instructions Provided: Care of device   [name] 07/21/2018, 6:41 PM

## 2018-07-21 NOTE — Progress Notes (Addendum)
Patient ID: Ryan [name], male   DOB: August 06, 1950, 68 y.o.   MRN: [id number]    Advanced Heart Failure Rounding Note   Subjective:    Milrinone increased to 0.375 yesterday, co-ox up to 74%.  SBP around 90, getting Bidil 0.5 tab tid.  He denies dyspnea.   Remains tachycardiac around 110 (ST). He did not diurese well yesterday, CVP 14 today.  Creatinine 2.39 => 2.6 but BUN stable.   On heparin for LV thrombus and right leg DVT. No bleeding. Right leg still very edematous and painful.   Objective:   Weight Range:  Vital Signs:   Temp:  [97.5 F (36.4 C)-98.7 F (37.1 C)] 97.8 F (36.6 C) (06/13 0812) Pulse Rate:  [41-126] 108 (06/13 0812) Resp:  [15-28] 18 (06/13 0309) BP: (84-112)/(62-89) 84/62 (06/13 0812) SpO2:  [95 %-100 %] 98 % (06/13 0309) Weight:  [65.6 kg] 65.6 kg (06/13 0623) Last BM Date: 07/19/18  Weight change: Filed Weights   07/19/18 1344 07/20/18 0500 07/21/18 0623  Weight: 72.9 kg 66.9 kg 65.6 kg    Intake/Output:   Intake/Output Summary (Last 24 hours) at 07/21/2018 1024 Last data filed at 07/21/2018 0839 Gross per 24 hour  Intake 2350.22 ml  Output 1625 ml  Net 725.22 ml     Physical Exam: General: NAD Neck: JVP 12-14 cm, no thyromegaly or thyroid nodule.  Lungs: Decreased BS at bases.  CV: Lateral PMI.  Heart mildly tachy, regular S1/S2, +soft S3, 2/6 HSM LLSB.  Trace edema left ankle.  1+ edema on right to knee.   Abdomen: Soft, nontender, no hepatosplenomegaly, no distention.  Skin: Intact without lesions or rashes.  Neurologic: Alert and oriented x 3.  Psych: Normal affect. Extremities: No clubbing or cyanosis.  HEENT: Normal.   Telemetry: Sinus tach 110s Personally reviewed   Labs: Basic Metabolic Panel: Recent Labs  Lab 07/14/18 1231  07/18/18 0656 07/18/18 1601 07/19/18 0201 07/20/18 0520 07/21/18 0450  NA  --    < > 140 138 138 139 135  K  --    < > 5.7* 4.8 5.0 4.3 3.6  CL  --    < > 109 111 110 110 104  CO2  --    < >  18* 16* 17* 21* 22  GLUCOSE  --    < > 140* 171* 125* 110* 71  BUN  --    < > 48* 56* 52* 56* 56*  CREATININE  --    < > 1.95* 2.23* 2.19* 2.39* 2.64*  CALCIUM  --    < > 9.0 9.0 9.0 8.7* 8.3*  MG  --   --   --   --   --   --  2.0  PHOS 4.2  --   --   --   --   --   --    < > = values in this interval not displayed.    Liver Function Tests: Recent Labs  Lab 07/16/18 0216 07/17/18 0209  AST 79* 63*  ALT 54* 49*  ALKPHOS 437* 418*  BILITOT 1.1 1.0  PROT 6.6 6.9  ALBUMIN 2.5* 2.4*   No results for input(s): LIPASE, AMYLASE in the last 168 hours. No results for input(s): AMMONIA in the last 168 hours.  CBC: Recent Labs  Lab 07/16/18 0828 07/20/18 0520 07/21/18 0450  WBC 7.4 6.9 6.7  HGB 16.3 13.8 12.9*  HCT 49.5 41.9 38.6*  MCV 91.7 90.3 89.1  PLT 105* 142* 150  Cardiac Enzymes: No results for input(s): CKTOTAL, CKMB, CKMBINDEX, TROPONINI in the last 168 hours.  BNP: BNP (last 3 results) No results for input(s): BNP in the last 8760 hours.  ProBNP (last 3 results) No results for input(s): PROBNP in the last 8760 hours.    Other results:  Imaging: Vas [place] Lower Extremity Venous (dvt)  Result Date: 07/19/2018  Lower Venous Study Indications: Pulmonary embolism.  Comparison Study: No prior studies. Performing Technologist: Oliver Hum RVT  Examination Guidelines: A complete evaluation includes B-mode imaging, spectral Doppler, color Doppler, and power Doppler as needed of all accessible portions of each vessel. Bilateral testing is considered an integral part of a complete examination. Limited examinations for reoccurring indications may be performed as noted.  +---------+---------------+---------+-----------+----------+-------+ RIGHT    CompressibilityPhasicitySpontaneityPropertiesSummary +---------+---------------+---------+-----------+----------+-------+ CFV      Full           No       No                            +---------+---------------+---------+-----------+----------+-------+ SFJ      Full                                                 +---------+---------------+---------+-----------+----------+-------+ FV Prox  None           No       No                   Acute   +---------+---------------+---------+-----------+----------+-------+ FV Mid   None           No       No                   Acute   +---------+---------------+---------+-----------+----------+-------+ FV DistalNone           No       No                   Acute   +---------+---------------+---------+-----------+----------+-------+ PFV      Full                                                 +---------+---------------+---------+-----------+----------+-------+ POP      None           No       No                   Acute   +---------+---------------+---------+-----------+----------+-------+ PTV      None                                         Acute   +---------+---------------+---------+-----------+----------+-------+ PERO     None                                         Acute   +---------+---------------+---------+-----------+----------+-------+ Gastroc  None  Acute   +---------+---------------+---------+-----------+----------+-------+   +---------+---------------+---------+-----------+----------+-------+ LEFT     CompressibilityPhasicitySpontaneityPropertiesSummary +---------+---------------+---------+-----------+----------+-------+ CFV      Full           No       No                           +---------+---------------+---------+-----------+----------+-------+ SFJ      Full                                                 +---------+---------------+---------+-----------+----------+-------+ FV Prox  Full                                                 +---------+---------------+---------+-----------+----------+-------+ FV Mid   Full                                                  +---------+---------------+---------+-----------+----------+-------+ FV DistalFull                                                 +---------+---------------+---------+-----------+----------+-------+ PFV      Full                                                 +---------+---------------+---------+-----------+----------+-------+ POP      Full           No       No                           +---------+---------------+---------+-----------+----------+-------+ PTV      Full                                                 +---------+---------------+---------+-----------+----------+-------+ PERO     Full                                                 +---------+---------------+---------+-----------+----------+-------+     Summary: Right: Findings consistent with acute deep vein thrombosis involving the right femoral vein, right popliteal vein, right posterior tibial vein, right peroneal vein, and right gastrocnemius vein. No cystic structure found in the popliteal fossa. Left: There is no evidence of deep vein thrombosis in the lower extremity. No cystic structure found in the popliteal fossa.  *See table(s) above for measurements and observations. Electronically signed by Servando Snare MD on 07/19/2018 at 5:33:50 PM.    Final    [place] Ekg Site Rite  Result Date: 07/19/2018 If Site Rite image not  attached, placement could not be confirmed due to current cardiac rhythm.    Medications:     Scheduled Medications: . benzonatate  100 mg Oral BID  . buPROPion  150 mg Oral Daily  . feeding supplement  1 Container Oral BID BM  . folic acid  1 mg Oral Daily  . furosemide  80 mg Intravenous BID  . hydrocortisone   Rectal BID  . insulin aspart  0-15 Units Subcutaneous TID WC  . insulin aspart  0-5 Units Subcutaneous QHS  . insulin glargine  10 Units Subcutaneous QHS  . metolazone  2.5 mg Oral Once  . multivitamin with minerals   1 tablet Oral Daily  . potassium chloride  40 mEq Oral Once  . Ensure Max Protein  11 oz Oral Daily  . ramelteon  8 mg Oral QHS  . sodium chloride flush  10-40 mL Intracatheter Q12H  . thiamine  100 mg Oral Daily    Infusions: . heparin 1,150 Units/hr (07/21/18 0400)  . milrinone 0.375 mcg/kg/min (07/21/18 0839)    PRN Medications: acetaminophen **OR** acetaminophen, ondansetron **OR** ondansetron (ZOFRAN) IV, senna-docusate, sodium chloride flush   Assessment/Plan:   1. Acute biventricular systolic HF-> cardiogenic shock - Suspect NICM due to HTN +/- ETOH (LV not overly dilated LVIDd 99mm) - Initial co-ox 45%. Now on milrinone 0.375 with co-ox up to 74%.  He feels better on milrinone.  - With SBP around 90, will stop Bidil.   - CVP higher today at 14 with creatinine to 2.6 but with stable BUN.  Co-ox now excellent, so will try aggressive diuresis at least today.  Lasix 80 mg IV bid today with 1 dose of metolazone 2.5.  - No digoxin with CKD - Needs coronary angiography if renal function permits - Unable to obtain MRI with creatinine> 1.5. - Not candidate for advanced therapies with social hx  2. Acute on CKD , unknown baseline  - Creatinine 1.95 -> 2.23 -> 2.19 -> 2.39 -> 2.6.  BUN stable.  - Renal US negative for hydronephrosis  3. LV Apical Thrombus  - continue heparin. Will eventually need transition to warfarin   4. RLE DVT - continue heparin. Will eventually need transition to warfarin  - Painfully swollen RLE, will add wraps to see if that helps.   5. Pleural Effusion -S/P R thoracentesis on 6/8  5. Cirrhosis -Noted on CT this admit  -AST/ALT trending up  6. ETOH abuse - Drinks 2 pints of bourbon/day - On CIWA protocol per primary team   7. Cocaine Abuse  8. DM2  9. Gout: Suspect gout flare right foot with warmth, elevated uric acid.  - Will give prednisone x 3 days.    Length of Stay: 8   Loralie Champagne MD 07/21/2018, 10:24 AM  Advanced  Heart Failure Team Pager [phone] (M-F; 7a - 4p)  Please contact [facility] for night-coverage after hours (4p -7a ) and weekends on [email or web address]

## 2018-07-21 NOTE — Progress Notes (Signed)
  Date: 07/21/2018  Patient name: Ryan [name]  Medical record number: [id number]  Date of birth: 04-14-1950   I have seen and evaluated this patient and I have discussed the plan of care with the house staff. Please see their note for complete details. I concur with their findings with the following additions/corrections:   68 year old [place] veteran admitted for progressive weakness, dyspnea, and cough.  Diagnosed with new severe HFrEF, LV thrombus, DVT, bilateral pleural effusions, alcoholic cirrhosis, and depression.  Continues to do well on milrinone drip overall, shortness of breath is improved significantly.  However, not diuresing as well past 24 hours and CVP is still elevated at 14.   Seems much more alert and interactive today.  His primary concern is swelling of his right foot.  1.  New diagnosis HFrEF, LV thrombus -LVEF quite low at 10 to 15%, biventricular failure, likely due to alcohol, but uncontrolled hypertension may have played a role, ventricles not as dilated as would be expected for alcohol induced cardiomyopathy -Cardiogenic shock is improving -Appreciate consultation from heart failure team -Continue milrinone, central O2 sat improved to 70s today, CVP still elevated -Continuing IV diuresis -Per heart failure, holding BiDil today for soft BP -If renal function allows, will need further evaluation with catheterization and/or MRI -Continue IV heparin for LV thrombus -Ferritin is less than 100, no evidence for hemochromatosis, may benefit from IV iron prior to discharge  2.  Possible acute kidney injury on CKD, unknown baseline -Continue treating HFrEF with low output as above to increase perfusion of the kidneys -Renin and aldosterone levels from admission returned normal  3.  Unintentional weight loss: Likely multifactorial with multiple severe, progressive medical issues as well as depression, suspect severe protein calorie malnutrition but unable to  confirm prior weights -Continue nutritional supplementation -Continue bupropion for MDD  4.  Right leg DVT -Continue IV heparin as above, will need to transition to warfarin at some point -Likely explains the swelling in his right leg today, will continue to follow  5.  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  Cirrhosis: Likely alcoholic cirrhosis, MELD 19 on admission, no acute treatment needed  Lenice Pressman, M.D., Ph.D. 07/21/2018, 5:10 PM

## 2018-07-21 NOTE — Progress Notes (Signed)
ANTICOAGULATION CONSULT NOTE - Follow Up Consult  Pharmacy Consult for heparin Indication: LV thrombus and DVT  Labs: Recent Labs    07/18/18 1601 07/19/18 0201  07/20/18 0520 07/20/18 0521 07/20/18 1410 07/20/18 2350  HGB  --   --   --  13.8  --   --   --   HCT  --   --   --  41.9  --   --   --   PLT  --   --   --  142*  --   --   --   HEPARINUNFRC  --   --    < >  --  0.41 0.92* 0.90*  CREATININE 2.23* 2.19*  --  2.39*  --   --   --    < > = values in this interval not displayed.    Assessment: 68yo male remains supratherapeutic on heparin after rate change; no gtt issues or signs of bleeding per RN.  Goal of Therapy:  Heparin level 0.3-0.7 units/ml   Plan:  Will decrease heparin gtt by 3 units/kg/hr to 1150 units/hr and check level in 8 hours.    Wynona Neat, PharmD, BCPS  07/21/2018,12:57 AM

## 2018-07-21 NOTE — Progress Notes (Signed)
Patient's right foot and ankle noted w/2-3+ pitting edema and patient c/o pain to the area 8/10.  Dr. Berline Lopes at bedside and aware.  No swelling to left foot; however, patient states left foot is painful to pressure.  Bilateral feet elevated.  Will continue to monitor.

## 2018-07-21 NOTE — Progress Notes (Signed)
Nutrition Follow-up  DOCUMENTATION CODES:   Not applicable  INTERVENTION:   D/C Ensure MAX  -Continue MVI with minerals daily -Continue Boost Breeze po BID, each supplement provides 250 kcal and 9 grams of protein -Magic cup TID with meals, each supplement provides 290 kcal and 9 grams of protein  NUTRITION DIAGNOSIS:   Inadequate oral intake related to poor appetite as evidenced by meal completion < 25%.  Progressing  GOAL:   Patient will meet greater than or equal to 90% of their needs  Progressing  MONITOR:   PO intake, Supplement acceptance, Skin, Labs, Weight trends, I & O's  REASON FOR ASSESSMENT:   Consult Poor PO  ASSESSMENT:   68 yo male w/ a PMHx notable for alcohol use disorder, substance abuse, cirrhosis, Hep C, diabetes who presents with wt loss, dysphaiga, nausea and abdominal pain x 2 months.    6/8- s/p thoracentesis  RD working remotely.  Spoke with pt via phone. Reports his appetite really picked up today. He finished 100% of his lunch. Meal completions charted as 0-50% for pt's last 8 meals. He is drinking Boost 1-2 daily but does not like Ensure.   RD consulted for low sodium education. Provided "Low Sodium Nutrition Therapy" handout from the Academy of [employer] in discharge instructions. Provided examples on ways to decrease sodium intake in diet. Discouraged intake of processed foods and use of salt shaker. Encouraged fresh fruits and vegetables as well as whole grain sources of carbohydrates to maximize fiber intake. RD discussed why it is important for patient to adhere to diet recommendations, and emphasized the role of fluids, foods to avoid, and importance of weighing self daily. Teach back method used.  Weight noted to decrease from 70.1 kg on 6/11 to 65.6 kg today.   I/O: +3,628 ml since admit UOP: 1,475 ml x 24 hrs   Drips: milrinone Medications: folic acid, 80 mg lasix once, SS novolog, lantus, thimaine Labs: CBG  60-251  Diet Order:   Diet Order            DIET DYS 3 Room service appropriate? Yes; Fluid consistency: Thin  Diet effective now              EDUCATION NEEDS:   No education needs have been identified at this time  Skin:  Skin Assessment: Reviewed RN Assessment  Last BM:  6/11  Height:   Ht Readings from Last 1 Encounters:  07/19/18 5\' 8"  (1.727 m)    Weight:   Wt Readings from Last 1 Encounters:  07/21/18 65.6 kg    Ideal Body Weight:  70 kg  BMI:  Body mass index is 21.99 kg/m.  Estimated Nutritional Needs:   Kcal:  2100-2300  Protein:  110-125 grams  Fluid:  > 2.1 L  Mariana Single RD, LDN Clinical Nutrition Pager # [phone]

## 2018-07-21 NOTE — Progress Notes (Signed)
Subjective: Patient states that he is feeling significantly improved today.  He denies chest pain, shortness of breath while at rest.  He does endorse increasing pain in his feet bilaterally more so on the right.  He notes that he has had swelling and pain in his feet for some time but typically this resolves with elevating his feet.  He denies a history of gout.  Objective:  Vital signs in last 24 hours: Vitals:   07/20/18 2200 07/20/18 2350 07/21/18 0309 07/21/18 0623  BP: 96/69 92/66 100/89   Pulse: (!) 118 (!) 119 (!) 110   Resp: 16 15 18    Temp:  98.5 F (36.9 C) 97.8 F (36.6 C)   TempSrc:  Oral Oral   SpO2: 98% 95% 98%   Weight:    65.6 kg  Height:       General: A/O x4, in no acute distress, afebrile, nondiaphoretic Cardio: RRR, no mrg's  Pulmonary: CTA bilaterally, no wheezing or crackles  Abdomen: Bowel sounds normal, soft, nontender  MSK: BLE edematous and tender, RLE markedly more edematous and tender but no evidence of Phlegmasia cerulea dolens. Psych: Appropriate affect, not depressed in appearance, engages well  Assessment/Plan:  Principal Problem:   Weight loss, unintentional Active Problems:   Acute kidney injury ([place])   History of alcohol use disorder   Proteinuria   Lactic acidosis   Cholestasis   Thrombocytopenia (H[facility])   Alcoholic cirrhosis (H[facility])   Hyperkalemia   Metabolic acidosis, normal anion gap (NAG)   Pleural effusion   Acute HFrEF (heart failure with reduced ejection fraction) ([place])  Ryan [name] is a 68 yo M w/ PMH of alcoholic cirrhosis and E3XV admit for weakness 2/2 newly diagnosed low output heart failure.Mr.Jungwirth shows significant improvement in respiratory status on milrinone despite Cooxemtery panel demonstrating O2 sats in the low 40's. Ideally we would like to maximize medical management with adding a beta-blocker and Ace-I/ARB to his med regimen prior to discharge but currently his soft BP has limited this.   A/P: Low output  heart failure w/ dyspnea: Left ventricular thrombus: Dyspnea improved with milrinone, patient prefers ambient air but does desaturate somewhat absent supplemental O2. His TTE: LVEF 40%, diastolic dysfunction, LV thrombus, Biventricular failure w/ severe atrial dilatation, mitral valve regurg that was considered moderate as well as the tricuspid valve regurg moderate-severe appears to most likely be 2/2 alcoholic cardiomyopathy.   Advanced heart failure considering coronary angiography if patient's renal function improves as well as MRI creatinine is less than 1.5. A thoracentesis yielded 1.2L out from right lungon 07/16/18 most likely the result of low output heart failure, in combination with his cirrhosis. I am uncertain what the final goal herein might be as he is reportedly not a candidate for advanced therapies given his social history.   -Appreciate heart failure recs:  Continue milirinone, stopping Bidil due to soft BP and need for increasing diuresis and metolazone 2.5 mg today. -Heart failure advancing to 80 mg twice daily IV furosemide - Monitor Cooximetry O2 levels improving - Keep O2 sat > 88 - Continue to maximize medical managment  RLE DVT: Worsening lower extremity edema w/ R>L in size. 3 pts on Wells criteria. [place] LE confirm RLE DVT.  This is nonerythematous but appears slightly more edematous.  May need to consider IR consultation with compression wrapping is of no benefit in the swelling worsens. - Continue heparin Gtt   UnintentionalWeight loss: Poor oral intake, malnutrition: Depression: Malignancy?: Patient endorsed a 60lb weight loss  on admission. This appears to be multifactorial likely 2/2 to poor oral intake, low output heart failure, depression, and cirrhosis but concern for malignancy remains. On admission he had dry mucous membranes, very low skin turgor and little to no urinary output. He was over hydrated subsequently which likely explains the weight gain. Post  diuresis his weight is down. Malignancy work up to date unremarkable. -C/wWellbutrin 150mg  daily for depression &alcohol craving - Zofran for nausea - Consult to nutrition placed - Daily weights  AKI on CKD 2/2Cardiorenal vsprogression of CKD: Given lack of prior recent records to establish a baseline sCr his renal function chronicity is difficult to determine. Most likely he has a degree of chronic renal insufficiency which is now progressing due to poor cardiac function.  sCR trending upward with diureses.  - Trend renal function - Avoid nephrotoxic meds  Hx of Cirrhosis:  Per chart review, hx of HCV infection (confirmed via biopsy) s/p treatment now absent HCV viral load with RNA negative serum. Steatosis and alcoholic cirrhosis from 8675 [AST 148, ALT 102, Bili of 20.1] noted. No record since then. Child-Pugh score 9.Abd [place]: cholelithiasis w/o obstruction. Cirrhotic liver with trace ascites. Right lobe hemangioma and left lobe lesion.  - Need to follow up with hepatology outpatient but most likely not a candidate for transplant given his social situation and EtOH use disorder. - If worsening liver function, start lactulose   DVT prophx:Heparin Gtt Diet:2 gram sodium diet Bowel:Senokot PRN Code:DNR Dispo: Anticipated discharge in approximately however long until he is stable.   Kathi Ludwig, MD 07/21/2018, 6:40 AM Pager: Pager# [phone]

## 2018-07-21 NOTE — Progress Notes (Signed)
Hypoglycemic Event  CBG: 60  Treatment: 4 oz juice/soda  Symptoms: None  Follow-up CBG: Time: 6015 CBG Result: 76  Possible Reasons for Event: Inadequate meal intake  Comments/MD notified: None    Kara Dies

## 2018-07-21 NOTE — Plan of Care (Signed)
  Problem: Clinical Measurements: Goal: Will remain free from infection Outcome: Progressing Goal: Respiratory complications will improve Outcome: Progressing   Problem: Nutrition: Goal: Adequate nutrition will be maintained Outcome: Progressing   

## 2018-07-21 NOTE — Progress Notes (Signed)
ANTICOAGULATION CONSULT NOTE - Follow Up Consult  Pharmacy Consult for heparin Indication: LV thrombus and DVT   HEPARIN DW (KG): 72.9  Labs: Recent Labs    07/19/18 0201  07/20/18 0520  07/20/18 1410 07/20/18 2350 07/21/18 0450 07/21/18 1018  HGB  --   --  13.8  --   --   --  12.9*  --   HCT  --   --  41.9  --   --   --  38.6*  --   PLT  --   --  142*  --   --   --  150  --   HEPARINUNFRC  --    < >  --    < > 0.92* 0.90*  --  0.91*  CREATININE 2.19*  --  2.39*  --   --   --  2.64*  --    < > = values in this interval not displayed.    Assessment: 68yo male subtherapeutic on heparin with initial dosing for LV thrombus and DVT.  Heparin level was therapeutic on heparin 1500 units/hr but then HL jumped up to 0.9 despite heparin drip running at lower rate. Heparin infusing in L Peripheral IV and labs drawn from R PICC line.  Heparin drip 1150 uts/hr HL 0.9  H/h stable No signs/symptoms of bleeding or issues with infusion reported.  Goal of Therapy:  Heparin level 0.3-0.7 units/ml   Plan:  Decrease heparin drip rate 950 uts/hr  Daily HL, CBC   Bonnita Nasuti Pharm.D. CPP, BCPS Clinical Pharmacist [phone] 07/21/2018 1:43 PM

## 2018-07-22 LAB — BASIC METABOLIC PANEL
Anion gap: 10 (ref 5–15)
BUN: 54 mg/dL — ABNORMAL HIGH (ref 8–23)
CO2: 22 mmol/L (ref 22–32)
Calcium: 8.3 mg/dL — ABNORMAL LOW (ref 8.9–10.3)
Chloride: 100 mmol/L (ref 98–111)
Creatinine, Ser: 2.7 mg/dL — ABNORMAL HIGH (ref 0.61–1.24)
GFR calc Af Amer: 27 mL/min — ABNORMAL LOW (ref >60)
GFR calc non Af Amer: 23 mL/min — ABNORMAL LOW (ref >60)
Glucose, Bld: 81 mg/dL (ref 70–99)
Potassium: 4.1 mmol/L (ref 3.5–5.1)
Sodium: 132 mmol/L — ABNORMAL LOW (ref 135–145)

## 2018-07-22 LAB — HEPARIN LEVEL (UNFRACTIONATED): Heparin Unfractionated: 0.53 IU/mL (ref 0.30–0.70)

## 2018-07-22 LAB — CBC
HCT: 37.5 % — ABNORMAL LOW (ref 39.0–52.0)
Hemoglobin: 12.5 g/dL — ABNORMAL LOW (ref 13.0–17.0)
MCH: 29.7 pg (ref 26.0–34.0)
MCHC: 33.3 g/dL (ref 30.0–36.0)
MCV: 89.1 fL (ref 80.0–100.0)
Platelets: 169 10*3/uL (ref 150–400)
RBC: 4.21 MIL/uL — ABNORMAL LOW (ref 4.22–5.81)
RDW: 13.8 % (ref 11.5–15.5)
WBC: 5.3 10*3/uL (ref 4.0–10.5)
nRBC: 0 % (ref 0.0–0.2)

## 2018-07-22 LAB — GLUCOSE, CAPILLARY
Glucose-Capillary: 69 mg/dL — ABNORMAL LOW (ref 70–99)
Glucose-Capillary: 222 mg/dL — ABNORMAL HIGH (ref 70–99)
Glucose-Capillary: 208 mg/dL — ABNORMAL HIGH (ref 70–99)
Glucose-Capillary: 222 mg/dL — ABNORMAL HIGH (ref 70–99)

## 2018-07-22 LAB — COOXEMETRY PANEL
Carboxyhemoglobin: 1.5 % (ref 0.5–1.5)
Methemoglobin: 0.9 % (ref 0.0–1.5)
O2 Saturation: 70.5 %
Total hemoglobin: 13 g/dL (ref 12.0–16.0)

## 2018-07-22 MED ORDER — IVABRADINE HCL 5 MG PO TABS
2.5000 mg | ORAL_TABLET | Freq: Two times a day (BID) | ORAL | Status: DC
  Administered 2018-07-22 – 2018-07-26 (×8): 2.5 mg via ORAL
  Filled 2018-07-22 (×8): qty 1

## 2018-07-22 NOTE — Progress Notes (Signed)
ANTICOAGULATION CONSULT NOTE - Follow Up Consult  Pharmacy Consult for heparin Indication: LV thrombus and DVT   HEPARIN DW (KG): 72.9  Labs: Recent Labs    07/20/18 0520  07/20/18 2350 07/21/18 0450 07/21/18 1018 07/22/18 0600  HGB 13.8  --   --  12.9*  --  12.5*  HCT 41.9  --   --  38.6*  --  37.5*  PLT 142*  --   --  150  --  169  HEPARINUNFRC  --    < > 0.90*  --  0.91* 0.53  CREATININE 2.39*  --   --  2.64*  --  2.70*   < > = values in this interval not displayed.    Assessment: 68yo male subtherapeutic on heparin with initial dosing for LV thrombus and DVT.  Heparin level 0.53 at goal on heparin drip rate 950uts/hr.   Heparin infusing in L Peripheral IV and labs drawn from R PICC line.  H/h stable No signs/symptoms of bleeding or issues with infusion reported.  Goal of Therapy:  Heparin level 0.3-0.7 units/ml   Plan:  Continue heparin drip rate 950 uts/hr  Daily HL, CBC   Bonnita Nasuti Pharm.D. CPP, BCPS Clinical Pharmacist [phone] 07/22/2018 11:41 AM

## 2018-07-22 NOTE — Progress Notes (Signed)
Subjective: [name] was examined at bedside and he reports that he feels somewhat better today. He denies lower extremity pain. He slept well yesterday and is wiling to ambulate today. He denied feeling short of breath at rest.   Objective:  Vital signs in last 24 hours: Vitals:   07/22/18 0300 07/22/18 0400 07/22/18 0500 07/22/18 0705  BP: 108/70 108/70  110/90  Pulse: (!) 112 (!) 112  (!) 110  Resp: (!) 33 (!) 22    Temp: 99.2 F (37.3 C)   97.9 F (36.6 C)  TempSrc: Oral   Oral  SpO2: 94% 94%  96%  Weight:   66.4 kg   Height:       General: A/O x4, in no acute distress, afebrile, nondiaphoretic Cardio: Slightly tachycardic today, no mrg's  Pulmonary: CTA bilaterally, no wheezing or crackles  Abdomen: Bowel sounds normal, soft, nontender  MSK: RLE edematous minimally tender, LLE mildly edematous Psych: Appropriate affect, not depressed in appearance, engages well  Assessment/Plan:  Principal Problem:   Weight loss, unintentional Active Problems:   Acute kidney injury ([place])   History of alcohol use disorder   Proteinuria   Lactic acidosis   Cholestasis   Thrombocytopenia (HCC)   Alcoholic cirrhosis (HCC)   Hyperkalemia   Metabolic acidosis, normal anion gap (NAG)   Pleural effusion   Acute HFrEF (heart failure with reduced ejection fraction) ([place])  [name] is a 68 yo M w/ PMH of alcoholic cirrhosis and X3KG admit forweakness 2/2 newly diagnosed low output heart failure.Mr.Nicholsonshows significant improvement in respiratory status on milrinone despite Cooxemtery panel demonstrating O2 sats in the low 40's. Ideally we would like to maximize medical management with adding a beta-blocker and Ace-I/ARB to his med regimen prior to discharge but currently his soft BP has limited this.   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  Advanced heart failure considering coronary angiography if patient's renal function improves as well as MRI creatinine is less than 1.5. -Appreciate heart failure recs: Continue milirinone, stopping Bidil due to soft BP and need for increasing diuresis and metolazone 2.5 mg today. - Stopped Lasix today due to weight less than dry weight and continued rise in creatinine despite adequate tissue perfusion - Monitor Cooximetry O2 levels improving - Keep O2 sat > 88 - Continue to maximize medical managment  RLE DVT: Lower extremity edema improved today w/ R>L in size but less so that the prior day.   - Continue compression wrapping - Continue heparin Gtt   UnintentionalWeight loss: Poor oral intake, malnutrition: Depression: Patient endorsed a 60lb weight loss on admission. Uncertain how accurate this may be. This appears to be multifactorial likely 2/2 to poor oral intake, low output heart failure, depression, and cirrhosis but concern for malignancy remains. On admission he had dry mucous membranes, very low skin turgor and little to no urinary output. He was over hydrated subsequently which likely explains the weight gain. Post diuresis his weight is down now lower than admission. Malignancy work up to date unremarkable. -C/wWellbutrin 150mg  daily for depression &alcohol craving - Zofran for nausea - Appreciate dietician recommendations - Daily weights  AKI on CKD 2/2Cardiorenal vsprogression of CKD: Given lack of prior recent records to establish a baseline sCr his renal function chronicity  is difficult to determine. Most likely he has a degree of chronic renal insufficiency which is now progressing due to poor cardiac function.  sCr trending upward with diureses.  - Trend renal function - Avoid nephrotoxic meds   Hx of Cirrhosis:  Per chart review, hx of HCV infection (confirmed via biopsy) s/p treatment now absent HCV viral load with RNA negative serum. Steatosis and alcoholic cirrhosis from 9290 [AST 148, ALT 102, Bili of 20.1] noted. No record since then. Child-Pugh score 9.Abd [place]: cholelithiasis w/o obstruction. Cirrhotic liver with trace ascites. Right lobe hemangioma and left lobe lesion.  - Need to follow up with hepatology outpatient but most likely not a candidate for transplant given his social situation and EtOH use disorder. - If worsening liver function and symptoms consider starting lactulose   DVT prophx:Heparin Gtt Diet:2 gram sodium diet Bowel:Senokot PRN Code:DNR Dispo: Anticipated discharge in approximately 2-3 day(s).   Kathi Ludwig, MD 07/22/2018, 10:15 AM Pager: Pager# [phone]

## 2018-07-22 NOTE — Plan of Care (Signed)
  Problem: Education: Goal: Knowledge of General Education information will improve Description: Including pain rating scale, medication(s)/side effects and non-pharmacologic comfort measures 07/22/2018 2053 by Roslyn Smiling, RN Outcome: Progressing 07/22/2018 2052 by Roslyn Smiling, RN Outcome: Progressing   Problem: Clinical Measurements: Goal: Ability to maintain clinical measurements within normal limits will improve Outcome: Progressing Goal: Will remain free from infection 07/22/2018 2053 by Yolander Goodie, Kristopher Glee, RN Outcome: Progressing 07/22/2018 2052 by Roslyn Smiling, RN Outcome: Progressing Goal: Diagnostic test results will improve 07/22/2018 2053 by Roslyn Smiling, RN Outcome: Progressing 07/22/2018 2052 by Roslyn Smiling, RN Outcome: Progressing Goal: Respiratory complications will improve 07/22/2018 2053 by Roslyn Smiling, RN Outcome: Progressing 07/22/2018 2052 by Roslyn Smiling, RN Outcome: Progressing Goal: Cardiovascular complication will be avoided 07/22/2018 2053 by Roslyn Smiling, RN Outcome: Progressing 07/22/2018 2052 by Roslyn Smiling, RN Outcome: Progressing

## 2018-07-22 NOTE — Progress Notes (Signed)
Patient ID: Ryan [name], male   DOB: 12/19/1950, 68 y.o.   MRN: [id number]    Advanced Heart Failure Rounding Note   Subjective:    Remains on milrinone 0.375. Co-ox up to 71%. Remains on IV lasix. Diuresis still sluggish. Weight up 2 pounds.   CVP 4-5 (checked personally)  Creatinine continues to climb 2.64 -> 2.70. remains tachycardic  LLE pain improved with treatment for acute gout with prednisone. Uric acid 13.7.   On heparin for LV thrombus and right leg DVT. No bleeding.   Objective:   Weight Range:  Vital Signs:   Temp:  [97.3 F (36.3 C)-99.2 F (37.3 C)] 97.9 F (36.6 C) (06/14 0705) Pulse Rate:  [110-120] 110 (06/14 0705) Resp:  [20-33] 22 (06/14 0400) BP: (101-118)/(63-90) 110/90 (06/14 0705) SpO2:  [94 %-100 %] 96 % (06/14 0705) Weight:  [66.4 kg] 66.4 kg (06/14 0500) Last BM Date: 07/19/18  Weight change: Filed Weights   07/20/18 0500 07/21/18 0623 07/22/18 0500  Weight: 66.9 kg 65.6 kg 66.4 kg    Intake/Output:   Intake/Output Summary (Last 24 hours) at 07/22/2018 1058 Last data filed at 07/22/2018 0800 Gross per 24 hour  Intake 1019.41 ml  Output 925 ml  Net 94.41 ml     Physical Exam: General: Cachetic. Weak appearing  HEENT: normal Neck: supple. JVP flat Carotids 2+ bilat; no bruits. No lymphadenopathy or thryomegaly appreciated. Cor: PMI nondisplaced. Tachy regular +s3 Lungs: clear Abdomen: soft, nontender, nondistended. No hepatosplenomegaly. No bruits or masses. Good bowel sounds. Extremities: no cyanosis, clubbing, rash, edema RLE wrapped  Neuro: alert & orientedx3, cranial nerves grossly intact. moves all 4 extremities w/o difficulty. Affect pleasant    Telemetry: Sinus tach 110-120 Personally reviewed   Labs: Basic Metabolic Panel: Recent Labs  Lab 07/18/18 1601 07/19/18 0201 07/20/18 0520 07/21/18 0450 07/22/18 0600  NA 138 138 139 135 132*  K 4.8 5.0 4.3 3.6 4.1  CL 111 110 110 104 100  CO2 16* 17* 21* 22 22   GLUCOSE 171* 125* 110* 71 81  BUN 56* 52* 56* 56* 54*  CREATININE 2.23* 2.19* 2.39* 2.64* 2.70*  CALCIUM 9.0 9.0 8.7* 8.3* 8.3*  MG  --   --   --  2.0  --     Liver Function Tests: Recent Labs  Lab 07/16/18 0216 07/17/18 0209  AST 79* 63*  ALT 54* 49*  ALKPHOS 437* 418*  BILITOT 1.1 1.0  PROT 6.6 6.9  ALBUMIN 2.5* 2.4*   No results for input(s): LIPASE, AMYLASE in the last 168 hours. No results for input(s): AMMONIA in the last 168 hours.  CBC: Recent Labs  Lab 07/16/18 0828 07/20/18 0520 07/21/18 0450 07/22/18 0600  WBC 7.4 6.9 6.7 5.3  HGB 16.3 13.8 12.9* 12.5*  HCT 49.5 41.9 38.6* 37.5*  MCV 91.7 90.3 89.1 89.1  PLT 105* 142* 150 169    Cardiac Enzymes: No results for input(s): CKTOTAL, CKMB, CKMBINDEX, TROPONINI in the last 168 hours.  BNP: BNP (last 3 results) No results for input(s): BNP in the last 8760 hours.  ProBNP (last 3 results) No results for input(s): PROBNP in the last 8760 hours.    Other results:  Imaging: No results found.   Medications:     Scheduled Medications: . benzonatate  100 mg Oral BID  . buPROPion  150 mg Oral Daily  . feeding supplement  1 Container Oral BID BM  . folic acid  1 mg Oral Daily  . hydrocortisone  Rectal BID  . insulin aspart  0-15 Units Subcutaneous TID WC  . insulin aspart  0-5 Units Subcutaneous QHS  . insulin glargine  10 Units Subcutaneous QHS  . multivitamin with minerals  1 tablet Oral Daily  . ramelteon  8 mg Oral QHS  . sodium chloride flush  10-40 mL Intracatheter Q12H  . thiamine  100 mg Oral Daily    Infusions: . heparin 950 Units/hr (07/22/18 0600)  . milrinone 0.375 mcg/kg/min (07/22/18 0916)    PRN Medications: acetaminophen **OR** acetaminophen, ondansetron **OR** ondansetron (ZOFRAN) IV, senna-docusate, sodium chloride flush   Assessment/Plan:   1. Acute biventricular systolic HF-> cardiogenic shock - Suspect NICM due to HTN +/- ETOH (LV not overly dilated LVIDd 68mm) -  Initial co-ox 45%. Now on milrinone 0.375 with co-ox up to 71%.  He feels better on milrinone.  - With SBP around 90. Bidil stopped - CVP 4-5 on my check today. With rising creatinine will stop lasix today. I d/w him and he says he has been told in the past that his kidneys were not normal and was followed at [facility]  - No digoxin with CKD. Will try low-dose corlanor.  - Needs coronary angiography if renal function permits - Unable to obtain MRI with creatinine> 1.5. - Not candidate for advanced therapies with social hx  2. Acute on CKD , unknown baseline  - Creatinine 1.95 -> 2.23 -> 2.19 -> 2.39 -> 2.6 -> 2.7.  BUN stable.  - Renal US negative for hydronephrosis - Stop lasix - With rising creatinine will stop lasix today. I d/w him and he says he has been told in the past that his kidneys were not normal and was followed at [facility]. Hopefully primary team can help [place] track down records.   3. LV Apical Thrombus  - continue heparin. Will eventually need transition to warfarin   4. RLE DVT - continue heparin. Will eventually need transition to warfarin   5. Pleural Effusion -S/P R thoracentesis on 6/8  5. Cirrhosis -Noted on CT this admit  -AST/ALT trending up  6. ETOH abuse - Drinks 2 pints of bourbon/day - On CIWA protocol per primary team   7. Cocaine Abuse  8. DM2  9. Acute Gout: Suspect gout flare right foot with warmth, elevated uric acid.  - Will give prednisone 40mg  x 3 days.  - Start allopurinol tomorrow    Length of Stay: 9   Afshin Chrystal MD 07/22/2018, 10:58 AM  Advanced Heart Failure Team Pager [phone] (M-F; [place])  Please contact [facility] for night-coverage after hours (4p -7a ) and weekends on [email or web address]

## 2018-07-23 DIAGNOSIS — R011 Cardiac murmur, unspecified: Secondary | ICD-10-CM

## 2018-07-23 DIAGNOSIS — K7031 Alcoholic cirrhosis of liver with ascites: Secondary | ICD-10-CM

## 2018-07-23 DIAGNOSIS — M109 Gout, unspecified: Secondary | ICD-10-CM

## 2018-07-23 DIAGNOSIS — E46 Unspecified protein-calorie malnutrition: Secondary | ICD-10-CM

## 2018-07-23 DIAGNOSIS — F329 Major depressive disorder, single episode, unspecified: Secondary | ICD-10-CM

## 2018-07-23 DIAGNOSIS — Z79899 Other long term (current) drug therapy: Secondary | ICD-10-CM

## 2018-07-23 DIAGNOSIS — Z8619 Personal history of other infectious and parasitic diseases: Secondary | ICD-10-CM

## 2018-07-23 DIAGNOSIS — I82401 Acute embolism and thrombosis of unspecified deep veins of right lower extremity: Secondary | ICD-10-CM

## 2018-07-23 DIAGNOSIS — I081 Rheumatic disorders of both mitral and tricuspid valves: Secondary | ICD-10-CM

## 2018-07-23 DIAGNOSIS — Z7289 Other problems related to lifestyle: Secondary | ICD-10-CM

## 2018-07-23 LAB — BASIC METABOLIC PANEL
Anion gap: 10 (ref 5–15)
BUN: 61 mg/dL — ABNORMAL HIGH (ref 8–23)
CO2: 22 mmol/L (ref 22–32)
Calcium: 8.4 mg/dL — ABNORMAL LOW (ref 8.9–10.3)
Chloride: 99 mmol/L (ref 98–111)
Creatinine, Ser: 3.08 mg/dL — ABNORMAL HIGH (ref 0.61–1.24)
GFR calc Af Amer: 23 mL/min — ABNORMAL LOW (ref >60)
GFR calc non Af Amer: 20 mL/min — ABNORMAL LOW (ref >60)
Glucose, Bld: 193 mg/dL — ABNORMAL HIGH (ref 70–99)
Potassium: 4.8 mmol/L (ref 3.5–5.1)
Sodium: 131 mmol/L — ABNORMAL LOW (ref 135–145)

## 2018-07-23 LAB — GLUCOSE, CAPILLARY
Glucose-Capillary: 154 mg/dL — ABNORMAL HIGH (ref 70–99)
Glucose-Capillary: 249 mg/dL — ABNORMAL HIGH (ref 70–99)
Glucose-Capillary: 136 mg/dL — ABNORMAL HIGH (ref 70–99)
Glucose-Capillary: 81 mg/dL (ref 70–99)

## 2018-07-23 LAB — CBC
HCT: 36.5 % — ABNORMAL LOW (ref 39.0–52.0)
Hemoglobin: 12.3 g/dL — ABNORMAL LOW (ref 13.0–17.0)
MCH: 29.9 pg (ref 26.0–34.0)
MCHC: 33.7 g/dL (ref 30.0–36.0)
MCV: 88.8 fL (ref 80.0–100.0)
Platelets: 143 10*3/uL — ABNORMAL LOW (ref 150–400)
RBC: 4.11 MIL/uL — ABNORMAL LOW (ref 4.22–5.81)
RDW: 13.5 % (ref 11.5–15.5)
WBC: 6.3 10*3/uL (ref 4.0–10.5)
nRBC: 0 % (ref 0.0–0.2)

## 2018-07-23 LAB — COOXEMETRY PANEL
Carboxyhemoglobin: 1 % (ref 0.5–1.5)
Methemoglobin: 0.7 % (ref 0.0–1.5)
O2 Saturation: 70.5 %
Total hemoglobin: 12.5 g/dL (ref 12.0–16.0)

## 2018-07-23 LAB — HEPARIN LEVEL (UNFRACTIONATED): Heparin Unfractionated: 0.41 IU/mL (ref 0.30–0.70)

## 2018-07-23 MED ORDER — SODIUM CHLORIDE 0.9 % IV SOLN
510.0000 mg | INTRAVENOUS | Status: AC
  Administered 2018-07-23 – 2018-07-30 (×2): 510 mg via INTRAVENOUS
  Filled 2018-07-23 (×2): qty 17

## 2018-07-23 MED ORDER — PANTOPRAZOLE SODIUM 40 MG PO TBEC
40.0000 mg | DELAYED_RELEASE_TABLET | Freq: Every day | ORAL | Status: DC
  Administered 2018-07-23 – 2018-08-01 (×10): 40 mg via ORAL
  Filled 2018-07-23 (×10): qty 1

## 2018-07-23 MED ORDER — ALLOPURINOL 100 MG PO TABS
100.0000 mg | ORAL_TABLET | Freq: Every day | ORAL | Status: DC
  Administered 2018-07-23 – 2018-07-24 (×2): 100 mg via ORAL
  Filled 2018-07-23 (×2): qty 1

## 2018-07-23 NOTE — Plan of Care (Signed)

## 2018-07-23 NOTE — Plan of Care (Signed)

## 2018-07-23 NOTE — Progress Notes (Signed)
Occupational Therapy Treatment Patient Details Name: Ryan [name] MRN: [id number] DOB: 1950/10/29 Today's Date: 07/23/2018    History of present illness 68 yo male w/ a PMHx notable for alcohol use disorder, diabetes, and GAD who presented with a two month history of anorexia, weight loss reported at 60lbs, malaise and intermittent abdominal pain. Found with LV thrombus and R LE DVT 6/11.    OT comments  Patient progressing slowly.  Min guard to close supervision for transfers, using RW.  LB dressing with min guard for safety/balance in standing, and grooming seated at sink with setup assist.  Continue per POC to increase strength and activity tolerance for safe dc home.  Next session plan to focus on energy conservation.    Follow Up Recommendations  Home health OT;Supervision/Assistance - 24 hour    Equipment Recommendations  3 in 1 bedside commode    Recommendations for Other Services PT consult    Precautions / Restrictions Precautions Precautions: Fall Restrictions Weight Bearing Restrictions: No       Mobility Bed Mobility Overal bed mobility: Needs Assistance Bed Mobility: Supine to Sit     Supine to sit: Supervision     General bed mobility comments: supervision for line management  Transfers Overall transfer level: Needs assistance Equipment used: Rolling walker (2 wheeled) Transfers: Sit to/from Stand Sit to Stand: Min guard;Supervision         General transfer comment: for safety - no LOB or overt instability    Balance Overall balance assessment: Needs assistance Sitting-balance support: No upper extremity supported;Feet supported Sitting balance-Leahy Scale: Good     Standing balance support: Bilateral upper extremity supported;During functional activity Standing balance-Leahy Scale: Fair Standing balance comment: relaint on UE support in standing                            ADL either performed or assessed with clinical  judgement   ADL Overall ADL's : Needs assistance/impaired     Grooming: Set up;Sitting;Wash/dry hands;Wash/dry face;Oral care(and shaving) Grooming Details (indicate cue type and reason): increased time and effort, required to complete seated             Lower Body Dressing: Min guard;Sit to/from stand Lower Body Dressing Details (indicate cue type and reason): able to don R sock with setup assist, min guard sit<>STand  Toilet Transfer: Min guard;Ambulation;RW Toilet Transfer Details (indicate cue type and reason): simulated to recliner          Functional mobility during ADLs: Min guard;Rolling walker       Vision       Perception     Praxis      Cognition Arousal/Alertness: Awake/alert Behavior During Therapy: WFL for tasks assessed/performed Overall Cognitive Status: Within Functional Limits for tasks assessed                                          Exercises     Shoulder Instructions       General Comments pt on RA with saturations >98%    Pertinent Vitals/ Pain       Pain Assessment: No/denies pain  Home Living  Prior Functioning/Environment              Frequency  Min 3X/week        Progress Toward Goals  OT Goals(current goals can now be found in the care plan section)  Progress towards OT goals: Progressing toward goals  Acute Rehab OT Goals Patient Stated Goal: Agreeable to getting OOB OT Goal Formulation: With patient Time For Goal Achievement: 07/28/18 Potential to Achieve Goals: Good  Plan Discharge plan remains appropriate;Frequency remains appropriate    Co-evaluation                 AM-PAC OT "6 Clicks" Daily Activity     Outcome Measure   Help from another person eating meals?: None Help from another person taking care of personal grooming?: (seated) Help from another person toileting, which includes using toliet, bedpan, or urinal?:  A Little Help from another person bathing (including washing, rinsing, drying)?: A Little Help from another person to put on and taking off regular upper body clothing?: None Help from another person to put on and taking off regular lower body clothing?: A Little 6 Click Score: 17    End of Session Equipment Utilized During Treatment: Rolling walker  OT Visit Diagnosis: Unsteadiness on feet (R26.81);Muscle weakness (generalized) (M62.81);Dizziness and giddiness (R42)   Activity Tolerance Patient tolerated treatment well   Patient Left in chair;with call bell/phone within reach   Nurse Communication Mobility status        Time: 0626-9485 OT Time Calculation (min): 40 min  Charges: OT General Charges $OT Visit: 1 Visit OT Treatments $Self Care/Home Management : 23-37 mins  Delight Stare, [place] Pager [phone] Office [phone]    Delight Stare 07/23/2018, 9:57 AM

## 2018-07-23 NOTE — Progress Notes (Signed)
Subjective:  Ryan [name] is a 68 y.o. M with PMH of alcoholic cirrhosis, K0XF admit for weakness on hospital day 10  Ryan [name] was examined this morning at bedside and reports that he had a "lovely night." He states his lower extremity pain has improved and only has minimal pain with certain movements of the leg. The only barrier to ambulating is the pain in his legs. He states the last time he visited a physician in the [place] was about a year ago. Denies any chest pain, dyspnea, palpitations, nausea, vomiting, confusion.  Objective:  Vital signs in last 24 hours: Vitals:   07/22/18 1532 07/22/18 2016 07/23/18 0009 07/23/18 0327  BP: 100/79 109/73 105/84 110/80  Pulse: (!) 116 (!) 112 (!) 106 (!) 102  Resp: 20 (!) 28 20 (!) 27  Temp: 98.4 F (36.9 C) 98.5 F (36.9 C) 97.6 F (36.4 C) 98.2 F (36.8 C)  TempSrc: Oral Oral Axillary Oral  SpO2: 98% 95% 95% 95%  Weight:    66.8 kg  Height:       Gen: Chronically ill-appearing, NAD Head: Temporal wasting CV: Tachycardic, regular rhythm, S1, S2 normal, systolic murmur Pulm: Right basilar rales but clear throughout, no accessory muscle use Abd: Soft, BS+, NTND, No rebound, no guarding Extm: ROM intact, Peripheral pulses intact, R lower extremity w/ edema > L Skin: Dry, Warm, normal turgor, no rashes, lesions, wounds.  Neuro: AAOx3 Psych: Normal mood and affect  Assessment/Plan:  Principal Problem:   Weight loss, unintentional Active Problems:   Acute kidney injury (HCC)   History of alcohol use disorder   Proteinuria   Lactic acidosis   Cholestasis   Thrombocytopenia (HCC)   Alcoholic cirrhosis (HCC)   Hyperkalemia   Metabolic acidosis, normal anion gap (NAG)   Pleural effusion   Acute HFrEF (heart failure with reduced ejection fraction) ([place])   RyanLarson is a 68 yo M w/ PMH of alcoholic cirrhosis and G1WE admit for weakness 2/2 cardiogenic shock.RyanLarson shows significant improvement in respiratory  status on milrinone w/ improvement in Cox panel from 40 to 76s. Unfortunately unable to diuresis further due to AKI but does not appear hypervolemic on exam. His gouty leg pain has also mostly resolved. Currently further work-up and treatment for his new diagnosis of heart failure has been complicated by his AKI. Plan for today is to reach out to [facility] to establish baseline renal function for further work-up.  Low output heart failurew/ dyspnea: Left ventricular thrombus: O2 sat 99 on RA this AM. BP 99/67 TTE 07/18/18: LVEF 99%, diastolic dysfunction, LV thrombus,Biventricular failure w/ severe atrial dilatation, mitral valve regurgthat was consideredmoderateas well as the tricuspid valve regurg moderate-severe.Advanced heart failure considering coronary angiography if patient's renal function improves as well as MRI creatinine is less than 1.5. -Appreciate heart failure recs:Continuemilirinone, holding diuresis due to AKI - Monitor Cooximetry O2 levelsimproving - Keep O2 sat > 88 - Continue medical management  AKI on CKD 2/2Cardiorenal vsprogression of CKD: Given lack of prior recent records to establish a baseline sCr his renal function chronicity is difficult to determine. Most likely he has a degree of chronic renal insufficiency which is now progressing due to poor cardiac function. sCr trending upward with diureses. Lasix on hold per heart failure today. - Will reach out to [facility] for records to establish baseline - Trend renal function - Avoid nephrotoxic meds  RLE DVT: Gout: Lower extremity edema improved today w/ R>L in size but less so than prior  day. Also received prednisone 40mg  for gouty flare w/ elevated uric acid - Start allopurinol 100 per HF - Continue compression wrapping -Continue heparin Gtt   UnintentionalWeight loss: Poor oral intake, malnutrition: Depression: Fluctuating weight since admission. Endorsed '60lb' weight loss prior to  admission. Multifactorial etiology: dysphagia, depression, low output heart failure, alcohol use disorder. High risk for malignancy but negative work-up so far. PHQ-9 score of 19 during hospitalization -C/wWellbutrin 150mg  daily for depression &alcohol craving - Zofran for nausea - Appreciate dietician recommendations - Daily weights  Hx of Cirrhosis: Per chart review, hx ofHCVinfection (confirmed via biopsy) s/p treatment now absent HCV viral load with RNA negative serum. Steatosis and alcoholic cirrhosis from 7408 [AST 148, ALT 102, Bili of 20.1]noted. No record since then. Child-Pugh score 9.Abd [place]: cholelithiasis w/o obstruction. Cirrhotic liver with trace ascites. Right lobe hemangioma and left lobe lesion.  - Need to follow up with hepatology outpatientbut most likely not a candidate for transplant given his social situation and EtOH use disorder. - If worsening liver function and symptoms consider starting lactulose   DVT prophx:Heparin Gtt Diet:2 gram sodium diet Bowel:Senokot PRN Code:DNR  Dispo: Anticipated discharge in approximately 2-3 day(s).   Ryan Anis, MD 07/23/2018, 6:49 AM Pager: [phone]

## 2018-07-23 NOTE — Progress Notes (Signed)
Physical Therapy Treatment Patient Details Name: Ryan [name] MRN: [id number] DOB: [birth year] Today's Date: 07/23/2018    History of Present Illness 68 yo male w/ a PMHx notable for alcohol use disorder, diabetes, and GAD who presented with a two month history of anorexia, weight loss reported at 60lbs, malaise and intermittent abdominal pain.    PT Comments    Patient received in supine - agreeable to OOB mobility this morning. Ambulating progressive distances with use of RW - on RA throughout session with SpO2 100% with mobility. Does require 3 standing rest breaks with gait due to LE fatigue/weakness. Making good progress towards all goals. Will continue to follow.     Follow Up Recommendations  Home health PT     Equipment Recommendations  Rolling walker with 5" wheels    Recommendations for Other Services       Precautions / Restrictions Precautions Precautions: Fall Restrictions Weight Bearing Restrictions: No    Mobility  Bed Mobility Overal bed mobility: Needs Assistance Bed Mobility: Supine to Sit     Supine to sit: Modified independent (Device/Increase time);Supervision     General bed mobility comments: supervision for line management  Transfers Overall transfer level: Needs assistance Equipment used: Rolling walker (2 wheeled) Transfers: Sit to/from Stand Sit to Stand: Min guard;Supervision         General transfer comment: for safety - no LOB or overt instability  Ambulation/Gait Ambulation/Gait assistance: Min guard Gait Distance (Feet): 250 Feet Assistive device: Rolling walker (2 wheeled) Gait Pattern/deviations: Step-through pattern;Decreased stride length;Trunk flexed Gait velocity: decreased   General Gait Details: steady pace of gait; required 3 standing rest breaks - SpO2 on RA 100% with HR up to 118 bpm - no subjective complaints other than LE weakness   Stairs             Wheelchair Mobility    Modified Rankin (Stroke  Patients Only)       Balance Overall balance assessment: Needs assistance Sitting-balance support: No upper extremity supported;Feet supported Sitting balance-Leahy Scale: Good     Standing balance support: Bilateral upper extremity supported;During functional activity Standing balance-Leahy Scale: Fair                              Cognition Arousal/Alertness: Awake/alert Behavior During Therapy: WFL for tasks assessed/performed Overall Cognitive Status: Within Functional Limits for tasks assessed                                        Exercises      General Comments        Pertinent Vitals/Pain Pain Assessment: No/denies pain    Home Living                      Prior Function            PT Goals (current goals can now be found in the care plan section) Acute Rehab PT Goals Patient Stated Goal: Agreeable to getting OOB PT Goal Formulation: With patient Time For Goal Achievement: 07/29/18 Potential to Achieve Goals: Good Progress towards PT goals: Progressing toward goals    Frequency    Min 3X/week      PT Plan Current plan remains appropriate    Co-evaluation              AM-PAC PT "6  Clicks" Mobility   Outcome Measure  Help needed turning from your back to your side while in a flat bed without using bedrails?: None Help needed moving from lying on your back to sitting on the side of a flat bed without using bedrails?: None Help needed moving to and from a bed to a chair (including a wheelchair)?: A Little Help needed standing up from a chair using your arms (e.g., wheelchair or bedside chair)?: A Little Help needed to walk in hospital room?: A Little Help needed climbing 3-5 steps with a railing? : A Little 6 Click Score: 20    End of Session Equipment Utilized During Treatment: Gait belt Activity Tolerance: Patient tolerated treatment well Patient left: in chair;with call bell/phone within reach(at  sink for shaving) Nurse Communication: Mobility status PT Visit Diagnosis: Unsteadiness on feet (R26.81);Other abnormalities of gait and mobility (R26.89)     Time: 6381-7711 PT Time Calculation (min) (ACUTE ONLY): 24 min  Charges:  $Gait Training: 8-22 mins                    Lanney Gins, PT, DPT Supplemental Physical Therapist 07/23/18 9:47 AM Pager: [phone] Office: [phone]

## 2018-07-23 NOTE — Progress Notes (Signed)
Patient ID: Ryan [name], male   DOB: [birth year], 68 y.o.   MRN: [id number]    Advanced Heart Failure Rounding Note   Subjective:    Remains on milrinone 0.375. Co-ox up to 70%. IV lasix stopped yesterday due to low CVP and worsening renal function Ivabradine started.  Feels ok. Wants to walk. Denies orthopnea, PND or CP.   CVP 10 (checked personally)  Creatinine continues to climb 2.64 -> 2.70 -> 3.08. HR down from 115 to 105 range  Gout pain resolved with prednisone.   On heparin for LV thrombus and right leg DVT. No bleeding.   Objective:   Weight Range:  Vital Signs:   Temp:  [97.5 F (36.4 C)-98.5 F (36.9 C)] 98.2 F (36.8 C) (06/15 0751) Pulse Rate:  [102-119] 105 (06/15 0751) Resp:  [18-28] 18 (06/15 0751) BP: (99-118)/(67-91) 99/67 (06/15 0751) SpO2:  [95 %-100 %] 100 % (06/15 0751) Weight:  [66.8 kg] 66.8 kg (06/15 0327) Last BM Date: 07/19/18  Weight change: Filed Weights   07/21/18 0623 07/22/18 0500 07/23/18 0327  Weight: 65.6 kg 66.4 kg 66.8 kg    Intake/Output:   Intake/Output Summary (Last 24 hours) at 07/23/2018 0802 Last data filed at 07/23/2018 0751 Gross per 24 hour  Intake 914.54 ml  Output 100 ml  Net 814.54 ml     Physical Exam: General: Cachetic. Weak appearing  HEENT: normal Neck: supple. JVP 9-10. Carotids 2+ bilat; no bruits. No lymphadenopathy or thryomegaly appreciated. Cor: PMI nondisplaced. Regular tachy +s3 Lungs: clear Abdomen: soft, nontender, nondistended. No hepatosplenomegaly. No bruits or masses. Good bowel sounds. Extremities: no cyanosis, clubbing, rash, edema RLE wrapped Neuro: alert & orientedx3, cranial nerves grossly intact. moves all 4 extremities w/o difficulty. Affect pleasant  Telemetry: Sinus tach 100-110 Personally reviewed   Labs: Basic Metabolic Panel: Recent Labs  Lab 07/19/18 0201 07/20/18 0520 07/21/18 0450 07/22/18 0600 07/23/18 0600  NA 138 139 135 132* 131*  K 5.0 4.3 3.6 4.1 4.8  CL  110 110 104 100 99  CO2 17* 21* 22 22 22   GLUCOSE 125* 110* 71 81 193*  BUN 52* 56* 56* 54* 61*  CREATININE 2.19* 2.39* 2.64* 2.70* 3.08*  CALCIUM 9.0 8.7* 8.3* 8.3* 8.4*  MG  --   --  2.0  --   --     Liver Function Tests: Recent Labs  Lab 07/17/18 0209  AST 63*  ALT 49*  ALKPHOS 418*  BILITOT 1.0  PROT 6.9  ALBUMIN 2.4*   No results for input(s): LIPASE, AMYLASE in the last 168 hours. No results for input(s): AMMONIA in the last 168 hours.  CBC: Recent Labs  Lab 07/16/18 0828 07/20/18 0520 07/21/18 0450 07/22/18 0600 07/23/18 0600  WBC 7.4 6.9 6.7 5.3 6.3  HGB 16.3 13.8 12.9* 12.5* 12.3*  HCT 49.5 41.9 38.6* 37.5* 36.5*  MCV 91.7 90.3 89.1 89.1 88.8  PLT 105* 142* 150 169 143*    Cardiac Enzymes: No results for input(s): CKTOTAL, CKMB, CKMBINDEX, TROPONINI in the last 168 hours.  BNP: BNP (last 3 results) No results for input(s): BNP in the last 8760 hours.  ProBNP (last 3 results) No results for input(s): PROBNP in the last 8760 hours.    Other results:  Imaging: No results found.   Medications:     Scheduled Medications: . benzonatate  100 mg Oral BID  . buPROPion  150 mg Oral Daily  . feeding supplement  1 Container Oral BID BM  . folic acid  1 mg Oral Daily  . hydrocortisone   Rectal BID  . insulin aspart  0-15 Units Subcutaneous TID WC  . insulin aspart  0-5 Units Subcutaneous QHS  . insulin glargine  10 Units Subcutaneous QHS  . ivabradine  2.5 mg Oral BID WC  . multivitamin with minerals  1 tablet Oral Daily  . ramelteon  8 mg Oral QHS  . sodium chloride flush  10-40 mL Intracatheter Q12H  . thiamine  100 mg Oral Daily    Infusions: . heparin 950 Units/hr (07/23/18 0600)  . milrinone 0.375 mcg/kg/min (07/23/18 0600)    PRN Medications: acetaminophen **OR** acetaminophen, ondansetron **OR** ondansetron (ZOFRAN) IV, senna-docusate, sodium chloride flush   Assessment/Plan:   1. Acute biventricular systolic HF-> cardiogenic  shock - Suspect NICM due to HTN +/- ETOH (LV not overly dilated LVIDd 78mm) - Initial co-ox 45%. Now on milrinone 0.375 with co-ox up to 70%.  He feels better on milrinone.  - With SBP around 90. Bidil stopped - CVP 9-10 on my check today. With rising creatinine will continue to hold lasix. I d/w him and he says he has been told in the past that his kidneys were not normal and was followed at [facility]  - No digoxin with CKD.  - HR improved with low-dose ivabradine. Will continue - Needs coronary angiography if renal function permits - Unable to obtain MRI with creatinine> 1.5. - Not candidate for advanced therapies with social hx and renal failure - Main issue now is what his kidneys will do. Will continue inotropes and hold lasix and follow.   2. Acute on CKD , unknown baseline  - Creatinine 1.95 -> 2.23 -> 2.19 -> 2.39 -> 2.6 -> 2.7 -> 3.0. - Renal US negative for hydronephrosis - Stop lasix - With rising creatinine will stop lasix today. I d/w him and he says he has been told in the past that his kidneys were not normal and was followed at [facility]. Hopefully primary team can help [place] track down records.   3. LV Apical Thrombus  - continue heparin. Will eventually need transition to warfarin. dbph   4. RLE DVT - continue heparin. Will eventually need transition to warfarin   5. Pleural Effusion -S/P R thoracentesis on 6/8  5. Cirrhosis -Noted on CT this admit  -AST/ALT trending up  6. ETOH abuse - Drinks 2 pints of bourbon/day - On CIWA protocol per primary team   7. Cocaine Abuse  8. DM2  9. Acute Gout: Suspect gout flare right foot with warmth, elevated uric acid.  - Will give prednisone 40mg  x 3 days. Today day 3/3 - Start allopurinol 100  10. Debility - ambulate   Length of Stay: 10   Glori Bickers MD 07/23/2018, 8:02 AM  Advanced Heart Failure Team Pager [phone] (M-F; [place])  Please contact [facility] for night-coverage after  hours (4p -7a ) and weekends on [email or web address]

## 2018-07-23 NOTE — Progress Notes (Signed)
  Date: 07/23/2018  Patient name: Ryan [name]  Medical record number: [id number]  Date of birth: 06/08/1950   I have seen and evaluated this patient and I have discussed the plan of care with Dr Truman Hayward. Please see his note for complete details. I concur with their findings with the following additions/corrections: follow HF recs, PT, get [facility] records, follow renal fxn.  Bartholomew Crews, MD 07/23/2018, 11:39 AM

## 2018-07-23 NOTE — Care Management (Signed)
Pt only has prescription coverage through [place].  Pt gets his medications filled at [facility] - please fax all prescriptions to [phone] to determine copay/cost to patient  Elenor Quinones, RN, BSN [phone]

## 2018-07-23 NOTE — Progress Notes (Signed)
ANTICOAGULATION CONSULT NOTE - Follow Up Consult  Pharmacy Consult for heparin Indication: LV thrombus and DVT   HEPARIN DW (KG): 72.9  Labs: Recent Labs    07/21/18 0450 07/21/18 1018 07/22/18 0600 07/23/18 0600  HGB 12.9*  --  12.5* 12.3*  HCT 38.6*  --  37.5* 36.5*  PLT 150  --  169 143*  HEPARINUNFRC  --  0.91* 0.53 0.41  CREATININE 2.64*  --  2.70* 3.08*    Assessment: 68yo male subtherapeutic on heparin with initial dosing for LV thrombus and DVT.  Heparin level therapeutic at 0.41 on heparin 950 units/hr. Hgb stable. No signs/symptoms of bleeding or issues with infusion reported.  Goal of Therapy:  Heparin level 0.3-0.7 units/ml   Plan:  Continue heparin drip rate 950 uts/hr  Daily HL, CBC   Claiborne Billings, PharmD PGY2 Cardiology Pharmacy Resident Please check AMION for all Pharmacist numbers by unit 07/23/2018 7:23 AM

## 2018-07-24 LAB — GLUCOSE, CAPILLARY
Glucose-Capillary: 195 mg/dL — ABNORMAL HIGH (ref 70–99)
Glucose-Capillary: 152 mg/dL — ABNORMAL HIGH (ref 70–99)
Glucose-Capillary: 187 mg/dL — ABNORMAL HIGH (ref 70–99)
Glucose-Capillary: 146 mg/dL — ABNORMAL HIGH (ref 70–99)

## 2018-07-24 LAB — BASIC METABOLIC PANEL
Anion gap: 10 (ref 5–15)
BUN: 61 mg/dL — ABNORMAL HIGH (ref 8–23)
CO2: 22 mmol/L (ref 22–32)
Calcium: 8.7 mg/dL — ABNORMAL LOW (ref 8.9–10.3)
Chloride: 100 mmol/L (ref 98–111)
Creatinine, Ser: 3.09 mg/dL — ABNORMAL HIGH (ref 0.61–1.24)
GFR calc Af Amer: 23 mL/min — ABNORMAL LOW (ref >60)
GFR calc non Af Amer: 20 mL/min — ABNORMAL LOW (ref >60)
Glucose, Bld: 98 mg/dL (ref 70–99)
Potassium: 4.4 mmol/L (ref 3.5–5.1)
Sodium: 132 mmol/L — ABNORMAL LOW (ref 135–145)

## 2018-07-24 LAB — CBC
HCT: 37.6 % — ABNORMAL LOW (ref 39.0–52.0)
Hemoglobin: 12.5 g/dL — ABNORMAL LOW (ref 13.0–17.0)
MCH: 29.6 pg (ref 26.0–34.0)
MCHC: 33.2 g/dL (ref 30.0–36.0)
MCV: 89.1 fL (ref 80.0–100.0)
Platelets: 167 10*3/uL (ref 150–400)
RBC: 4.22 MIL/uL (ref 4.22–5.81)
RDW: 13.9 % (ref 11.5–15.5)
WBC: 4.8 10*3/uL (ref 4.0–10.5)
nRBC: 0 % (ref 0.0–0.2)

## 2018-07-24 LAB — COOXEMETRY PANEL
Carboxyhemoglobin: 1 % (ref 0.5–1.5)
Carboxyhemoglobin: 1.1 % (ref 0.5–1.5)
Methemoglobin: 1 % (ref 0.0–1.5)
Methemoglobin: 0.9 % (ref 0.0–1.5)
O2 Saturation: 56.3 %
O2 Saturation: 64.5 %
Total hemoglobin: 12.4 g/dL (ref 12.0–16.0)
Total hemoglobin: 12.5 g/dL (ref 12.0–16.0)

## 2018-07-24 LAB — HEPARIN LEVEL (UNFRACTIONATED): Heparin Unfractionated: 0.52 IU/mL (ref 0.30–0.70)

## 2018-07-24 MED ORDER — FUROSEMIDE 80 MG PO TABS
80.0000 mg | ORAL_TABLET | Freq: Every day | ORAL | Status: DC
  Administered 2018-07-24 – 2018-07-29 (×6): 80 mg via ORAL
  Filled 2018-07-24 (×6): qty 1

## 2018-07-24 NOTE — Progress Notes (Signed)
ANTICOAGULATION CONSULT NOTE - Follow Up Consult  Pharmacy Consult for heparin Indication: LV thrombus and DVT   HEPARIN DW (KG): 72.9  Labs: Recent Labs    07/22/18 0600 07/23/18 0600 07/24/18 0500  HGB 12.5* 12.3* 12.5*  HCT 37.5* 36.5* 37.6*  PLT 169 143* 167  HEPARINUNFRC 0.53 0.41 0.52  CREATININE 2.70* 3.08* 3.09*    Assessment: 68yo male subtherapeutic on heparin with initial dosing for LV thrombus and DVT.  Heparin level therapeutic at 0.52 on heparin 950 units/hr. Hgb stable. No signs/symptoms of bleeding or issues with infusion reported.  Goal of Therapy:  Heparin level 0.3-0.7 units/ml   Plan:  Continue heparin drip rate 950 uts/hr  Daily HL, CBC  Follow up starting warfarin   Claiborne Billings, PharmD PGY2 Cardiology Pharmacy Resident Please check AMION for all Pharmacist numbers by unit 07/24/2018 10:50 AM

## 2018-07-24 NOTE — Progress Notes (Signed)
Patient ID: Ryan [name], male   DOB: 01-06-1951, 68 y.o.   MRN: [id number]    Advanced Heart Failure Rounding Note   Subjective:    Remains on milrinone 0.375. Co-ox 65%. IV lasix stopped 6/14 due to low CVP and AKI.  Says he is feeling better today but still really anxious about his prognosis. Denies SOB, CP, orthopnea or PND. Remains tachy.   CVP 11 (checked personally)  Creatinine 2.64 -> 2.70 -> 3.08 -> 3.09   On heparin for LV thrombus and right leg DVT. No bleeding.   Objective:   Weight Range:  Vital Signs:   Temp:  [97.4 F (36.3 C)-98.2 F (36.8 C)] 97.5 F (36.4 C) (06/16 1113) Pulse Rate:  [97-106] 106 (06/16 1113) Resp:  [12-21] 15 (06/16 1113) BP: (97-115)/(62-95) 97/77 (06/16 1113) SpO2:  [94 %-100 %] 99 % (06/16 1113) Weight:  [67.6 kg] 67.6 kg (06/16 0310) Last BM Date: 07/19/18  Weight change: Filed Weights   07/22/18 0500 07/23/18 0327 07/24/18 0310  Weight: 66.4 kg 66.8 kg 67.6 kg    Intake/Output:   Intake/Output Summary (Last 24 hours) at 07/24/2018 1240 Last data filed at 07/24/2018 1211 Gross per 24 hour  Intake 891.68 ml  Output 1075 ml  Net -183.32 ml     Physical Exam: General: Cachetic. Weak appearing  HEENT: normal Neck: supple. JVP to jaw. Carotids 2+ bilat; no bruits. No lymphadenopathy or thryomegaly appreciated. Cor: PMI nondisplaced. Regular tachy +s3 Lungs: [name]  Abdomen: soft, nontender, nondistended. No hepatosplenomegaly. No bruits or masses. Good bowel sounds. Extremities: no cyanosis, clubbing, rash, edema Neuro: alert & orientedx3, cranial nerves grossly intact. moves all 4 extremities w/o difficulty. Affect pleasant   Telemetry: Sinus tach 100-110 Personally reviewed   Labs: Basic Metabolic Panel: Recent Labs  Lab 07/20/18 0520 07/21/18 0450 07/22/18 0600 07/23/18 0600 07/24/18 0500  NA 139 135 132* 131* 132*  K 4.3 3.6 4.1 4.8 4.4  CL 110 104 100 99 100  CO2 21* 22 22 22 22   GLUCOSE 110* 71 81 193*  98  BUN 56* 56* 54* 61* 61*  CREATININE 2.39* 2.64* 2.70* 3.08* 3.09*  CALCIUM 8.7* 8.3* 8.3* 8.4* 8.7*  MG  --  2.0  --   --   --     Liver Function Tests: No results for input(s): AST, ALT, ALKPHOS, BILITOT, PROT, ALBUMIN in the last 168 hours. No results for input(s): LIPASE, AMYLASE in the last 168 hours. No results for input(s): AMMONIA in the last 168 hours.  CBC: Recent Labs  Lab 07/20/18 0520 07/21/18 0450 07/22/18 0600 07/23/18 0600 07/24/18 0500  WBC 6.9 6.7 5.3 6.3 4.8  HGB 13.8 12.9* 12.5* 12.3* 12.5*  HCT 41.9 38.6* 37.5* 36.5* 37.6*  MCV 90.3 89.1 89.1 88.8 89.1  PLT 142* 150 169 143* 167    Cardiac Enzymes: No results for input(s): CKTOTAL, CKMB, CKMBINDEX, TROPONINI in the last 168 hours.  BNP: BNP (last 3 results) No results for input(s): BNP in the last 8760 hours.  ProBNP (last 3 results) No results for input(s): PROBNP in the last 8760 hours.    Other results:  Imaging: No results found.   Medications:     Scheduled Medications: . allopurinol  100 mg Oral Daily  . benzonatate  100 mg Oral BID  . buPROPion  150 mg Oral Daily  . feeding supplement  1 Container Oral BID BM  . folic acid  1 mg Oral Daily  . furosemide  80 mg Oral  Daily  . hydrocortisone   Rectal BID  . insulin aspart  0-15 Units Subcutaneous TID WC  . insulin aspart  0-5 Units Subcutaneous QHS  . insulin glargine  10 Units Subcutaneous QHS  . ivabradine  2.5 mg Oral BID WC  . multivitamin with minerals  1 tablet Oral Daily  . pantoprazole  40 mg Oral Daily  . ramelteon  8 mg Oral QHS  . sodium chloride flush  10-40 mL Intracatheter Q12H  . thiamine  100 mg Oral Daily    Infusions: . ferumoxytol 510 mg (07/23/18 1143)  . heparin 950 Units/hr (07/23/18 0600)  . milrinone 0.375 mcg/kg/min (07/24/18 1211)    PRN Medications: acetaminophen **OR** acetaminophen, ondansetron **OR** ondansetron (ZOFRAN) IV, senna-docusate, sodium chloride flush   Assessment/Plan:    1. Acute biventricular systolic HF-> cardiogenic shock - Suspect NICM due to HTN +/- ETOH (LV not overly dilated LVIDd 58mm) - Initial co-ox 45%. Now on milrinone 0.375 with co-ox up to 65%.  - CVP 10. Has been off diuretics due to AKI - With SBP around 90. Bidil stopped - No digoxin with CKD.  - HR improved with low-dose ivabradine. Will continue - Needs coronary angiography if renal function permits - Unable to obtain MRI with creatinine> 1.5. - Not candidate for advanced therapies with social hx and renal failure - Main issue remains that renal function not improving with inotropic support. I called [facility]) personally and baseline creatinine was 1.65 in 9/19. Will continue inotropic support for at least 48 more hours. If no improvement, will attempt to wean milrinone and see if we can keep him stable off inotropes.  - not good candidate for home inotropes currently - Start oral lasix 80 daily - agree with [place] discussion but he has been adamant to me that he wants everything done  2. Acute on CKD , unknown baseline  - Creatinine 1.95 -> 2.23 -> 2.19 -> 2.39 -> 2.6 -> 2.7 -> 3.0 -> 3.1 - Renal US negative for hydronephrosis - See discussion above   3. LV Apical Thrombus  - continue heparin. Will eventually need transition to warfarin. dbph   4. RLE DVT - continue heparin. Will eventually need transition to warfarin   5. Pleural Effusion -S/P R thoracentesis on 6/8  5. Cirrhosis -Noted on CT this admit  -AST/ALT trending up  6. ETOH abuse - Drinks 2 pints of bourbon/day - On CIWA protocol per primary team   7. Cocaine Abuse  8. DM2  9. Acute Gout: Suspect gout flare right foot with warmth, elevated uric acid.  - Finished prednisone burst - On allopurinol 100    Length of Stay: 11   Donye Dauenhauer MD 07/24/2018, 12:40 PM  Advanced Heart Failure Team Pager [phone] (M-F; [place])  Please contact [facility] for night-coverage after hours (4p  -7a ) and weekends on [email or web address]

## 2018-07-24 NOTE — TOC Progression Note (Addendum)
Transition of Care [facility]) - Progression Note    Patient Details  Name: Ryan [name] MRN: [id number] Date of Birth: Sep 23, 1950  Transition of Care [facility]) CM/SW Contact  Maryclare Labrador, RN Phone Number: 07/24/2018, 1:50 PM  Clinical Narrative:   Pt remains on IV milrinone - lasix had to be discontinued due to rising creatinine.  CM updated [facility] this am regarding pts status.  Pt confirms he only a PCP at the [facility].  Pt confirms he is from home alone. Per previous CM note pt selected [facility] for [facility] - referral given to agency.   New DVT so now on IV heparin    Expected Discharge Plan: [place] Barriers to Discharge: Continued Medical Work up  Expected Discharge Plan and Services Expected Discharge Plan: [place] In-house Referral: NA Discharge Planning Services: CM Consult Post Acute Care Choice: [place] arrangements for the past 2 months: Single Family Home Expected Discharge Date: 07/16/18               DME Arranged: N/A DME Agency: NA       HH Arranged: RN, Disease Management, PT, OT HH Agency: [facility] Date [place] Agency Contacted: 07/16/18 Time [place]: 3735 Representative spoke with at [place]: [place] (SDOH) Interventions    Readmission Risk Interventions No flowsheet data found.

## 2018-07-24 NOTE — Progress Notes (Addendum)
Subjective:  Ryan [name] is a 68 y.o. M with PMH of  alcoholic cirrhosis, J5KK admit for weakness on hospital day 28  Ryan [name] was examined and evaluated at bedside this morning and he asked "do you have any good news for me today?" We instructed him regarding his clinical course and the recommendation from cardiology. He inquired if we could remove the [place] boots. He endorses shortness of breath but denies chest pain, palpitation, light headedness, dizziness. He reports that he feels good and denies pain. We initiative [place] conversation and made him aware that we will revisit this. He has a daughter in [place] and has been updating her. He lives in [place] and has a support system.    Objective:  Vital signs in last 24 hours: Vitals:   07/23/18 1923 07/23/18 2344 07/24/18 0000 07/24/18 0310  BP: 103/62  (!) 115/95 114/88  Pulse: 100 (!) 103 (!) 102 (!) 103  Resp: 16 13 16 12   Temp: 98.2 F (36.8 C) 97.6 F (36.4 C)  97.7 F (36.5 C)  TempSrc: Oral Oral  Oral  SpO2: 94% 100% 98% 100%  Weight:    67.6 kg  Height:       Physical Exam  Constitutional: He is oriented to person, place, and time. No distress.  Chronically ill-appearing  HENT:  Mouth/Throat: No oropharyngeal exudate.  Temporal wasting  Neck: Normal range of motion. Neck supple. No JVD present.  Pulmonary/Chest: Effort normal. He has no wheezes. He has rales (Right lower base crackles).  + dullness to percussion on R base  Musculoskeletal: Normal range of motion.        General: Edema (RLL w/ non-pitting edema covered in bandaging) present.  Neurological: He is alert and oriented to person, place, and time.   Assessment/Plan:  Principal Problem:   Weight loss, unintentional Active Problems:   Acute kidney injury ([place])   History of alcohol use disorder   Proteinuria   Lactic acidosis   Cholestasis   Thrombocytopenia (HCC)   Alcoholic cirrhosis (HCC)   Hyperkalemia   Metabolic acidosis, normal  anion gap (NAG)   Pleural effusion   Acute HFrEF (heart failure with reduced ejection fraction) (HCC)  Ryan [name] is a 68 y.o. M with PMH of  alcoholic cirrhosis, X3GH admit for low-output heart failure. Continues to have improvement in subjective symptoms but worsening cardiac output. 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 On physical exam showing evidence of right sided pleural effusion. IMTS will re-examine with POC ultrasound later today to confirm.   Low output heart failure Left ventricular thrombus O2 sat 99 on RA this am. BP 114/88. Cooxemetry panel: O2 sat 56.3 - per Fick's 3.4L/min decreased from yesterday. Med changes include discontinuation of furosemide and initiation of ivabradine for rate control. Currently appear to have right sided pleural effusion on exam but euvolemic otherwise. Unable to continue w/ further work-up due to renal failure. Poor prognosis considering low output with evidence of organ failure. Per HF, will need trial of milironone in 48 hrs to see if tolerable status. - Appreciate Heart Failure recs: Start PO furosemide 80mg  daily, c/w milrinone titration - Will continue goals of care discussion, may need to consult palliative - POC ultrasound in PM to assess right pleural effusion   AKI on CKD Creatinine 2.6->2.7->3.0->3.1. 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 Per Dr.Benshimon, baseline 1.6 - Trend renal fx - Avoid nephrotoxic meds  RLE DVT Right lower extremity swelling improved on exam - C/w heparin gtt   DVT prophx: heparin gtt Diet: DYS 3 Code: DNR  Dispo: Anticipated discharge in approximately 2-3 day(s).   Ryan Anis, MD 07/24/2018, 6:49 AM Pager: [phone]

## 2018-07-24 NOTE — Progress Notes (Signed)
  I spoke to physicians at the [facility]. His baseline creatinine in 8/19 was 1.65.  Glori Bickers, MD  9:04 AM

## 2018-07-24 NOTE — Plan of Care (Signed)

## 2018-07-25 ENCOUNTER — Inpatient Hospital Stay (HOSPITAL_COMMUNITY): Payer: Medicare Other

## 2018-07-25 DIAGNOSIS — J90 Pleural effusion, not elsewhere classified: Secondary | ICD-10-CM

## 2018-07-25 DIAGNOSIS — F1010 Alcohol abuse, uncomplicated: Secondary | ICD-10-CM

## 2018-07-25 LAB — CBC
HCT: 36.2 % — ABNORMAL LOW (ref 39.0–52.0)
Hemoglobin: 12.1 g/dL — ABNORMAL LOW (ref 13.0–17.0)
MCH: 29.8 pg (ref 26.0–34.0)
MCHC: 33.4 g/dL (ref 30.0–36.0)
MCV: 89.2 fL (ref 80.0–100.0)
Platelets: 139 10*3/uL — ABNORMAL LOW (ref 150–400)
RBC: 4.06 MIL/uL — ABNORMAL LOW (ref 4.22–5.81)
RDW: 13.8 % (ref 11.5–15.5)
WBC: 4.8 10*3/uL (ref 4.0–10.5)
nRBC: 0 % (ref 0.0–0.2)

## 2018-07-25 LAB — BASIC METABOLIC PANEL
Anion gap: 8 (ref 5–15)
BUN: 52 mg/dL — ABNORMAL HIGH (ref 8–23)
CO2: 22 mmol/L (ref 22–32)
Calcium: 8.5 mg/dL — ABNORMAL LOW (ref 8.9–10.3)
Chloride: 103 mmol/L (ref 98–111)
Creatinine, Ser: 2.59 mg/dL — ABNORMAL HIGH (ref 0.61–1.24)
GFR calc Af Amer: 28 mL/min — ABNORMAL LOW (ref >60)
GFR calc non Af Amer: 24 mL/min — ABNORMAL LOW (ref >60)
Glucose, Bld: 76 mg/dL (ref 70–99)
Potassium: 3.9 mmol/L (ref 3.5–5.1)
Sodium: 133 mmol/L — ABNORMAL LOW (ref 135–145)

## 2018-07-25 LAB — GLUCOSE, CAPILLARY
Glucose-Capillary: 64 mg/dL — ABNORMAL LOW (ref 70–99)
Glucose-Capillary: 178 mg/dL — ABNORMAL HIGH (ref 70–99)
Glucose-Capillary: 132 mg/dL — ABNORMAL HIGH (ref 70–99)
Glucose-Capillary: 93 mg/dL (ref 70–99)

## 2018-07-25 LAB — COOXEMETRY PANEL
Carboxyhemoglobin: 1 % (ref 0.5–1.5)
Methemoglobin: 0.8 % (ref 0.0–1.5)
O2 Saturation: 62 %
Total hemoglobin: 12.8 g/dL (ref 12.0–16.0)

## 2018-07-25 LAB — HEPARIN LEVEL (UNFRACTIONATED)
Heparin Unfractionated: 0.27 IU/mL — ABNORMAL LOW (ref 0.30–0.70)
Heparin Unfractionated: 0.37 IU/mL (ref 0.30–0.70)

## 2018-07-25 MED ORDER — INSULIN GLARGINE 100 UNIT/ML ~~LOC~~ SOLN
8.0000 [IU] | Freq: Every day | SUBCUTANEOUS | Status: DC
  Administered 2018-07-25 – 2018-07-29 (×5): 8 [IU] via SUBCUTANEOUS
  Filled 2018-07-25 (×6): qty 0.08

## 2018-07-25 MED ORDER — POTASSIUM CHLORIDE CRYS ER 10 MEQ PO TBCR
10.0000 meq | EXTENDED_RELEASE_TABLET | Freq: Once | ORAL | Status: AC
  Administered 2018-07-25: 09:00:00 10 meq via ORAL
  Filled 2018-07-25: qty 1

## 2018-07-25 MED ORDER — WARFARIN SODIUM 5 MG PO TABS
5.0000 mg | ORAL_TABLET | Freq: Once | ORAL | Status: AC
  Administered 2018-07-25: 5 mg via ORAL
  Filled 2018-07-25: qty 1

## 2018-07-25 MED ORDER — WARFARIN - PHARMACIST DOSING INPATIENT: Freq: Every day | Status: DC

## 2018-07-25 NOTE — Progress Notes (Signed)
Physical Therapy Treatment Patient Details Name: Ryan [name] MRN: [id number] DOB: 01/27/1951 Today's Date: 07/25/2018    History of Present Illness 68 yo male w/ a PMHx notable for alcohol use disorder, diabetes, and GAD who presented with a two month history of anorexia, weight loss reported at 60lbs, malaise and intermittent abdominal pain. Found with LV thrombus and R LE DVT 6/11.     PT Comments    Needed encouragement.  Visibly fatigued.  Pt deferred exercise in lieu of progressive gait.  VSS, but pt visibly fatigued.   Follow Up Recommendations  Home health PT     Equipment Recommendations  Rolling walker with 5" wheels    Recommendations for Other Services       Precautions / Restrictions Precautions Precautions: Fall Restrictions Weight Bearing Restrictions: No    Mobility  Bed Mobility Overal bed mobility: Needs Assistance Bed Mobility: Supine to Sit;Sit to Supine     Supine to sit: Supervision Sit to supine: Supervision   General bed mobility comments: min guard for line mgmt and safety  Transfers Overall transfer level: Needs assistance Equipment used: Rolling walker (2 wheeled) Transfers: Sit to/from Stand Sit to Stand: Min guard;Min assist(min from a lower surface)         General transfer comment: cues for hand placement  Ambulation/Gait Ambulation/Gait assistance: Min assist Gait Distance (Feet): 100 Feet Assistive device: Rolling walker (2 wheeled) Gait Pattern/deviations: Step-through pattern Gait velocity: decreased   General Gait Details: mildly unsteady overall.  Wandering in the RW and drifting the RW.  Notable dyspnea, but sats in the uper 90's and EHR in the 90's   Stairs             Wheelchair Mobility    Modified Rankin (Stroke Patients Only)       Balance Overall balance assessment: Needs assistance Sitting-balance support: No upper extremity supported;Feet supported Sitting balance-Leahy Scale: Good       Standing balance-Leahy Scale: Fair                              Cognition Arousal/Alertness: Awake/alert Behavior During Therapy: WFL for tasks assessed/performed Overall Cognitive Status: Within Functional Limits for tasks assessed                                        Exercises      General Comments General comments (skin integrity, edema, etc.): pt on 2L supplemental oxgyen via [place] today; initated education on energy conservation techniques, how techniques apply to his routine and activity tolerance with self care activities; issued handout      Pertinent Vitals/Pain Pain Assessment: No/denies pain    Home Living                      Prior Function            PT Goals (current goals can now be found in the care plan section) Acute Rehab PT Goals Patient Stated Goal: to rest  PT Goal Formulation: With patient Time For Goal Achievement: 07/29/18 Potential to Achieve Goals: Good Progress towards PT goals: Progressing toward goals    Frequency    Min 3X/week      PT Plan Current plan remains appropriate    Co-evaluation              AM-PAC  PT "6 Clicks" Mobility   Outcome Measure  Help needed turning from your back to your side while in a flat bed without using bedrails?: None Help needed moving from lying on your back to sitting on the side of a flat bed without using bedrails?: None Help needed moving to and from a bed to a chair (including a wheelchair)?: A Little Help needed standing up from a chair using your arms (e.g., wheelchair or bedside chair)?: A Little Help needed to walk in hospital room?: A Little Help needed climbing 3-5 steps with a railing? : A Little 6 Click Score: 20    End of Session   Activity Tolerance: Patient limited by fatigue Patient left: in bed;with call bell/phone within reach Nurse Communication: Mobility status PT Visit Diagnosis: Unsteadiness on feet (R26.81);Other abnormalities  of gait and mobility (R26.89)     Time: 1721-1735 PT Time Calculation (min) (ACUTE ONLY): 14 min  Charges:  $Gait Training: 8-22 mins                     07/25/2018  Donnella Sham, PT Acute Rehabilitation Services [phone]  (pager) [phone]  (office)   Ryan [name] 07/25/2018, 5:42 PM

## 2018-07-25 NOTE — Progress Notes (Signed)
Patient ID: Ryan [name], male   DOB: [birth year], 68 y.o.   MRN: [id number]    Advanced Heart Failure Rounding Note   Subjective:    Remains on milrinone 0.375. Feels much better today. Denies SOB, orthopnea or PND.   On lasix 80 po. CVP 5-6  Remains tachy.    Creatinine 2.64 -> 2.70 -> 3.08 -> 3.09 -> 2.59  On heparin for LV thrombus and right leg DVT. No bleeding.   Objective:   Weight Range:  Vital Signs:   Temp:  [97.5 F (36.4 C)-98.6 F (37 C)] 97.6 F (36.4 C) (06/17 0802) Pulse Rate:  [101-106] 101 (06/16 2305) Resp:  [15-27] 18 (06/16 2305) BP: (97-119)/(77-91) 111/87 (06/16 2305) SpO2:  [88 %-99 %] 94 % (06/17 0318) Weight:  [66.9 kg] 66.9 kg (06/17 0655) Last BM Date: 07/19/18  Weight change: Filed Weights   07/23/18 0327 07/24/18 0310 07/25/18 0655  Weight: 66.8 kg 67.6 kg 66.9 kg    Intake/Output:   Intake/Output Summary (Last 24 hours) at 07/25/2018 0922 Last data filed at 07/25/2018 0802 Gross per 24 hour  Intake 860.71 ml  Output 950 ml  Net -89.29 ml     Physical Exam: General:  Sitting up in chair. No resp difficulty HEENT: normal Neck: supple. JVP 5. Carotids 2+ bilat; no bruits. No lymphadenopathy or thryomegaly appreciated. Cor: PMI nondisplaced. Tachy regular. No rubs, gallops or murmurs. Lungs: clear but dull on R.  Abdomen: soft, nontender, nondistended. No hepatosplenomegaly. No bruits or masses. Good bowel sounds. Extremities: no cyanosis, clubbing, rash, edema Neuro: alert & orientedx3, cranial nerves grossly intact. moves all 4 extremities w/o difficulty. Affect pleasant   Telemetry: Sinus tach 100-115 Personally reviewed   Labs: Basic Metabolic Panel: Recent Labs  Lab 07/21/18 0450 07/22/18 0600 07/23/18 0600 07/24/18 0500 07/25/18 0412  NA 135 132* 131* 132* 133*  K 3.6 4.1 4.8 4.4 3.9  CL 104 100 99 100 103  CO2 22 22 22 22 22   GLUCOSE 71 81 193* 98 76  BUN 56* 54* 61* 61* 52*  CREATININE 2.64* 2.70* 3.08*  3.09* 2.59*  CALCIUM 8.3* 8.3* 8.4* 8.7* 8.5*  MG 2.0  --   --   --   --     Liver Function Tests: No results for input(s): AST, ALT, ALKPHOS, BILITOT, PROT, ALBUMIN in the last 168 hours. No results for input(s): LIPASE, AMYLASE in the last 168 hours. No results for input(s): AMMONIA in the last 168 hours.  CBC: Recent Labs  Lab 07/21/18 0450 07/22/18 0600 07/23/18 0600 07/24/18 0500 07/25/18 0412  WBC 6.7 5.3 6.3 4.8 4.8  HGB 12.9* 12.5* 12.3* 12.5* 12.1*  HCT 38.6* 37.5* 36.5* 37.6* 36.2*  MCV 89.1 89.1 88.8 89.1 89.2  PLT 150 169 143* 167 139*    Cardiac Enzymes: No results for input(s): CKTOTAL, CKMB, CKMBINDEX, TROPONINI in the last 168 hours.  BNP: BNP (last 3 results) No results for input(s): BNP in the last 8760 hours.  ProBNP (last 3 results) No results for input(s): PROBNP in the last 8760 hours.    Other results:  Imaging: No results found.   Medications:     Scheduled Medications: . benzonatate  100 mg Oral BID  . buPROPion  150 mg Oral Daily  . feeding supplement  1 Container Oral BID BM  . folic acid  1 mg Oral Daily  . furosemide  80 mg Oral Daily  . hydrocortisone   Rectal BID  . insulin aspart  0-15  Units Subcutaneous TID WC  . insulin aspart  0-5 Units Subcutaneous QHS  . insulin glargine  10 Units Subcutaneous QHS  . ivabradine  2.5 mg Oral BID WC  . multivitamin with minerals  1 tablet Oral Daily  . pantoprazole  40 mg Oral Daily  . ramelteon  8 mg Oral QHS  . sodium chloride flush  10-40 mL Intracatheter Q12H  . thiamine  100 mg Oral Daily    Infusions: . ferumoxytol 510 mg (07/23/18 1143)  . heparin 1,050 Units/hr (07/25/18 0500)  . milrinone 0.375 mcg/kg/min (07/24/18 2145)    PRN Medications: acetaminophen **OR** acetaminophen, ondansetron **OR** ondansetron (ZOFRAN) IV, senna-docusate, sodium chloride flush   Assessment/Plan:   1. Acute biventricular systolic HF-> cardiogenic shock - Suspect NICM due to HTN +/-  ETOH (LV not overly dilated LVIDd 7mm) - Initial co-ox 45%. Now on milrinone 0.375 with co-ox up to 65% yesterday. Repeat pending today  - CVP improved. Now 5-6.  - On lasix 80 po daily. Will continue this dose today - HR improved with low-dose ivabradine. Will continue - No b-block or ARB with low output and renal failure - If BP holds can considering restarting Bidil 0.5 tid - Hopefully can start milrinone wean tomorrow  - Needs coronary angiography if renal function permits - Unable to obtain MRI with creatinine> 1.5. - Not candidate for advanced therapies with social hx and renal failure - If fails milrinone wean will have to have SW access and see if he is candidate for home inotropes despite his past history. He seems dedicated to making changes in lifestyle  2. Acute on CKD , unknown baseline  - Creatinine 1.95 -> 2.23 -> 2.19 -> 2.39 -> 2.6 -> 2.7 -> 3.0 -> 3.1 -> 2.6 - Renal US negative for hydronephrosis - See discussion above   3. LV Apical Thrombus  - continue heparin. Will eventually need transition to warfarin. As I don't think we will get to cath this admission with renal failure. Will start warfarin .Discussed dosing with PharmD personally.  4. RLE DVT - continue heparin. Start warfarin  5. Pleural Effusion -S/P R thoracentesis on 6/8 - mildly dull on exam  - repeat CXR  5. Cirrhosis -Noted on CT this admit  -AST/ALT trending up  6. ETOH abuse - Drinks 2 pints of bourbon/day - On CIWA protocol per primary team   7. Cocaine Abuse  8. DM2  9. Acute Gout: Suspect gout flare right foot with warmth, elevated uric acid.  - Finished prednisone burst -management per primary team     Length of Stay: 12   Glori Bickers MD 07/25/2018, 9:22 AM  Advanced Heart Failure Team Pager [phone] (M-F; 7a - 4p)  Please contact [facility] for night-coverage after hours (4p -7a ) and weekends on [email or web address]

## 2018-07-25 NOTE — Progress Notes (Signed)
Occupational Therapy Treatment Patient Details Name: Ryan [name] MRN: [id number] DOB: April 17, 1950 Today's Date: 07/25/2018    History of present illness 68 yo male w/ a PMHx notable for alcohol use disorder, diabetes, and GAD who presented with a two month history of anorexia, weight loss reported at 60lbs, malaise and intermittent abdominal pain. Found with LV thrombus and R LE DVT 6/11.    OT comments  Patient agreeable to OT.  Noted increased fatigue and poor activity tolerance.  HR remains 103 supine and sitting EOB.  Min guard for bed mobility for safety and line mgmt.  Issued and reviewed energy conservation technique handout, described application to his ADL routine.  Pt verbalized understanding.  Will follow.    Follow Up Recommendations  Home health OT;Supervision/Assistance - 24 hour    Equipment Recommendations  3 in 1 bedside commode    Recommendations for Other Services      Precautions / Restrictions Precautions Precautions: Fall Restrictions Weight Bearing Restrictions: No       Mobility Bed Mobility Overal bed mobility: Needs Assistance Bed Mobility: Supine to Sit;Sit to Supine     Supine to sit: Min guard Sit to supine: Min guard   General bed mobility comments: min guard for line mgmt and safety  Transfers                 General transfer comment: deferred due to decreased act tolerance, fatigue    Balance Overall balance assessment: Needs assistance Sitting-balance support: No upper extremity supported;Feet supported Sitting balance-Leahy Scale: Good                                     ADL either performed or assessed with clinical judgement   ADL Overall ADL's : Needs assistance/impaired                                     Functional mobility during ADLs: Min guard General ADL Comments: bed mobility only, increased fatigue today     Vision       Perception     Praxis      Cognition  Arousal/Alertness: Awake/alert Behavior During Therapy: WFL for tasks assessed/performed Overall Cognitive Status: Within Functional Limits for tasks assessed                                          Exercises     Shoulder Instructions       General Comments pt on 2L supplemental oxgyen via [place] today; initated education on energy conservation techniques, how techniques apply to his routine and activity tolerance with self care activities; issued handout    Pertinent Vitals/ Pain       Pain Assessment: No/denies pain  Home Living                                          Prior Functioning/Environment              Frequency  Min 3X/week        Progress Toward Goals  OT Goals(current goals can now be found in the care plan section)  Progress towards  OT goals: Progressing toward goals  Acute Rehab OT Goals Patient Stated Goal: to rest  OT Goal Formulation: With patient  Plan Discharge plan remains appropriate;Frequency remains appropriate    Co-evaluation                 AM-PAC OT "6 Clicks" Daily Activity     Outcome Measure   Help from another person eating meals?: None Help from another person taking care of personal grooming?: None(seated) Help from another person toileting, which includes using toliet, bedpan, or urinal?: A Little Help from another person bathing (including washing, rinsing, drying)?: A Little Help from another person to put on and taking off regular upper body clothing?: None Help from another person to put on and taking off regular lower body clothing?: A Little 6 Click Score: 21    End of Session    OT Visit Diagnosis: Unsteadiness on feet (R26.81);Muscle weakness (generalized) (M62.81);Dizziness and giddiness (R42)   Activity Tolerance Patient limited by fatigue   Patient Left in bed;with call bell/phone within reach   Nurse Communication Mobility status;Other (comment)(fatigue)         Time: 0100-7121 OT Time Calculation (min): 10 min  Charges: OT General Charges $OT Visit: 1 Visit OT Treatments $Self Care/Home Management : 8-22 mins  Delight Stare, [place] Pager [phone] Office [phone]    Delight Stare 07/25/2018, 4:29 PM

## 2018-07-25 NOTE — Progress Notes (Signed)
ANTICOAGULATION CONSULT NOTE - Follow Up Consult  Pharmacy Consult for heparin Indication: LV thrombus and DVT  Labs: Recent Labs    07/23/18 0600 07/24/18 0500 07/25/18 0412  HGB 12.3* 12.5* 12.1*  HCT 36.5* 37.6* 36.2*  PLT 143* 167 139*  HEPARINUNFRC 0.41 0.52 0.27*  CREATININE 3.08* 3.09* 2.59*    Assessment: 68yo male now subtherapeutic on heparin after several levels at goal at current rate; no gtt issues or signs of bleeding per RN.  Goal of Therapy:  Heparin level 0.3-0.7 units/ml   Plan:  Will increase heparin gtt by 1-2 units/kg/hr to 1050 units/hr and check level in 8 hours.    Wynona Neat, PharmD, BCPS  07/25/2018,4:55 AM

## 2018-07-25 NOTE — Care Management Important Message (Signed)
Important Message  Patient Details  Name: Ryan [name] MRN: [id number] Date of Birth: 22-Jul-1950   Medicare Important Message Given:  Yes    [name] 07/25/2018, 2:01 PM

## 2018-07-25 NOTE — Progress Notes (Signed)
Inpatient Diabetes Program Recommendations  AACE/ADA: New Consensus Statement on Inpatient Glycemic Control (2015)  Target Ranges:  Prepandial:   less than 140 mg/dL      Peak postprandial:   less than 180 mg/dL (1-2 hours)      Critically ill patients:  140 - 180 mg/dL   Lab Results  Component Value Date   GLUCAP 178 (H) 07/25/2018   HGBA1C 8.3 (H) 07/14/2018    Review of Glycemic Control Results for SLAYTON, LUBITZ (MRN [id number]) as of 07/25/2018 12:34  Ref. Range 07/25/2018 06:53 07/25/2018 11:14  Glucose-Capillary Latest Ref Range: 70 - 99 mg/dL 64 (L) 178 (H)   Diabetes history: DM 2 Outpatient Diabetes medications: Glucotrol 5 mg daily Current orders for Inpatient glycemic control:  Novolog moderate tid with meals and HS Lantus 10 units q HS Inpatient Diabetes Program Recommendations:    Consider d/c of Lantus due to low blood sugars.   Thanks  Adah Perl, RN, BC-ADM Inpatient Diabetes Coordinator Pager [phone] (8a-5p)

## 2018-07-25 NOTE — Progress Notes (Addendum)
Subjective:  Ryan [name] is a 68 y.o. with PMH of alcoholic cirrhosis, U5KY admit for weakness on hospital day 12  Ryan [name] was examined at bedside and had just finished his breakfast. He reports of lower extremity edema but good urinary output with frequent trips to the bathroom to urinate. We did make him aware that his lasix was resumed and also his renal function is improving. He is willing for [place] to talk to his daughter regarding his clinical course and management. He denies shortness of breath but is continuing to endorse productive cough with clear sputum.  Also had prolonged conversation with his daughter, Ryan [name] [phone]) who is a nephrology nurse in [place] about Ryan [name] current state. Discussed interval improvement on milrinone but overall poor prognosis and recent goals of care discussion. Ryan [name] understands Ryan [name]'s poor health and provides additional history that he has had long history of alcohol abuse and while she is sad about his situation, it was not a surprise. She mentions that she has urged him to move down to [place] but she states he is 'in denial' and wishes to continue to live independently by himself. Discussed plan to continue to update her regarding any changes and all other questions were addressed.  Objective:  Vital signs in last 24 hours: Vitals:   07/24/18 1600 07/24/18 1901 07/24/18 2305 07/25/18 0318  BP: 110/78 119/83 111/87   Pulse: (!) 102 (!) 102 (!) 101   Resp: (!) 24 (!) 27 18   Temp:  97.6 F (36.4 C) 98.6 F (37 C) 98.1 F (36.7 C)  TempSrc:  Oral Oral Oral  SpO2: (!) 88% 98% 97% 94%  Weight:      Height:       Gen: Chronically ill-appearing, not in acute distress Neck: supple, ROM intact CV: RRR, S1, S2 normal, No rubs, no murmurs, no gallops Pulm: Diminished breath sounds on R, No rales, no wheezes, R sided dullness to percussion up to mid thorax Extm: ROM intact, Peripheral pulses intact, 1+ pitting  bilateral ankle edema R>L  Assessment/Plan:  Principal Problem:   Weight loss, unintentional Active Problems:   Acute kidney injury (HCC)   History of alcohol use disorder   Proteinuria   Lactic acidosis   Cholestasis   Thrombocytopenia (HCC)   Alcoholic cirrhosis (HCC)   Hyperkalemia   Metabolic acidosis, normal anion gap (NAG)   Pleural effusion   Acute HFrEF (heart failure with reduced ejection fraction) (HCC)  Ryan [name] is a 68 y.o. M with PMH of alcoholic cirrhosis, T5VV admit for low-output heart failure. POC ultrasound yesterday demonstrated evidence of recurrence of moderate-large R sided pleural effusion. Currently without subjective dyspnea and saturating 96% on RA. Oral diuretics were restarted yesterday and we will continue to monitor output. His AKI has shown improvement today and he may be slowly regaining some cardiac function but his status is still very tenuous and long-term prognosis continues to be poor. Family has been updated today and we will continue ongoing conversation about goals of care.  Low output heart failure Left ventricular thrombus O2 sat 96 on RA this am. BP 113/79. HR 100 On milrinon 0.375. Co-ox pending. Lower extremity edema on exam. Furosemide restarted per heart failure yesterday - Appreciate Heart Failure recs: Plan to titrate down milrinone tomorrow - Will continue goals of care discussion, may need to consult palliative   R sided pleural effusion 2/2 heart failure vs hepatic hemothorax Re-occurrence of r sided pleural effusion. At beginning of hospital course,  had thoracentesis showing transudative effusion thought to be in part due to cirrhosis but with new evidence of low output heart failure, re-collection of this effusion is probably due to multifactorial etiology. Will monior and repeat thoracentesis for symptom management if needed - C/w diuresis as above - Keep O2 sat >88  AKI on CKD Creatinine 2.6->2.7->3.0->3.1->2.6.  Improvement after 5 days if milironone infusion on and off diuretics. Hopefully the trend will continue - Trend renal fx - Avoid nephrotoxic meds  RLE DVT Right lower extremity swelling improved on exam, but now showing bilateral pitting edema. Will continue diuresis - C/w heparin gtt  DVT prophx: heparin gtt Diet: DYS 3 Code: DNR  Dispo: Anticipated discharge in approximately 3-4 day(s).   [name], MD 07/25/2018, 6:25 AM Pager: [phone]

## 2018-07-25 NOTE — Progress Notes (Signed)
ANTICOAGULATION CONSULT NOTE - Follow Up Consult  Pharmacy Consult for heparin Indication: LV thrombus and DVT   HEPARIN DW (KG): 72.9  Labs: Recent Labs    07/23/18 0600 07/24/18 0500 07/25/18 0412 07/25/18 1522  HGB 12.3* 12.5* 12.1*  --   HCT 36.5* 37.6* 36.2*  --   PLT 143* 167 139*  --   HEPARINUNFRC 0.41 0.52 0.27* 0.37  CREATININE 3.08* 3.09* 2.59*  --     Assessment: 68yo male subtherapeutic on heparin with initial dosing for LV thrombus and DVT. Pharmacy also consulted to start warfarin.   Heparin level therapeutic at 0.37 on heparin 1050 units/hr. Hgb stable. No signs/symptoms of bleeding or issues with infusion reported.   Goal of Therapy:  Heparin level 0.3-0.7 units/ml    Plan:  Continue heparin drip rate 1050 uts/hr  Warfarin 5 mg x1 Daily HL, INR, CBC   Claiborne Billings, PharmD PGY2 Cardiology Pharmacy Resident Please check AMION for all Pharmacist numbers by unit 07/25/2018 4:02 PM

## 2018-07-25 NOTE — Progress Notes (Signed)
Nutrition Follow-up  DOCUMENTATION CODES:   Not applicable  INTERVENTION:   - Continue MVI with minerals daily  - ContinueBoost Breeze poBID, each supplement provides 250 kcal and 9 grams of protein  - Continue Magic cup TID with meals, each supplement provides 290 kcal and 9 grams of protein  - Double protein BID at lunch and dinner meals  NUTRITION DIAGNOSIS:   Inadequate oral intake related to poor appetite as evidenced by meal completion < 25%.  Progressing  GOAL:   Patient will meet greater than or equal to 90% of their needs  Progressing  MONITOR:   PO intake, Supplement acceptance, Skin, Labs, Weight trends, I & O's  REASON FOR ASSESSMENT:   Consult Poor PO  ASSESSMENT:   68 yo male w/ a PMHx notable for alcohol use disorder, substance abuse, cirrhosis, Hep C, diabetes who presents with wt loss, dysphaiga, nausea and abdominal pain x 2 months.   6/8 - s/p thoracentesis  POC ultrasound yesterday showing recurrence of right sided pleural effusion. No plan for repeat thoracentesis unless needed for symptom management.  Noted plan for milrinone wean tomorrow.  Weight down 6 lbs since first measured weight on 6/06. Pt continues to diurese and is now back on Lasix. Will continue to monitor trends.  Unable to reach pt via phone call. Will continue with current oral nutrition supplements and add double protein at lunch and dinner meals.  Meal Completion: 25-100% x last 6 recorded meals  Medications reviewed and include: Boost Breeze BID (pt refusing ~31%), folic acid, Lasix, SSI, Lantus 8 units daily, MVI with minerals, Protonix, thiamine  Drips: milrinone, heparin  Labs reviewed: BUN 52, creatinine 2.59 CBG's: 64-187 x 24 hours  UOP: 1150 ml x 24 hours I/O's: +4.8 L since admit  Diet Order:   Diet Order            DIET DYS 3 Room service appropriate? Yes; Fluid consistency: Thin  Diet effective now              EDUCATION NEEDS:   No  education needs have been identified at this time  Skin:  Skin Assessment: Reviewed RN Assessment  Last BM:  07/24/18  Height:   Ht Readings from Last 1 Encounters:  07/19/18 5\' 8"  (1.727 m)    Weight:   Wt Readings from Last 1 Encounters:  07/25/18 66.9 kg    Ideal Body Weight:  70 kg  BMI:  Body mass index is 22.43 kg/m.  Estimated Nutritional Needs:   Kcal:  2100-2300  Protein:  110-125 grams  Fluid:  > 2.1 L    Gaynell Face, MS, RD, LDN Inpatient Clinical Dietitian Pager: [phone] Weekend/After Hours: [phone]

## 2018-07-26 ENCOUNTER — Encounter (HOSPITAL_COMMUNITY): Payer: Self-pay | Admitting: Radiology

## 2018-07-26 ENCOUNTER — Inpatient Hospital Stay (HOSPITAL_COMMUNITY): Payer: Medicare Other

## 2018-07-26 HISTORY — PX: IR THORACENTESIS ASP PLEURAL SPACE W/IMG GUIDE: IMG5380

## 2018-07-26 LAB — CBC
HCT: 37.5 % — ABNORMAL LOW (ref 39.0–52.0)
Hemoglobin: 12.8 g/dL — ABNORMAL LOW (ref 13.0–17.0)
MCH: 29.8 pg (ref 26.0–34.0)
MCHC: 34.1 g/dL (ref 30.0–36.0)
MCV: 87.2 fL (ref 80.0–100.0)
Platelets: 155 10*3/uL (ref 150–400)
RBC: 4.3 MIL/uL (ref 4.22–5.81)
RDW: 13.8 % (ref 11.5–15.5)
WBC: 5.1 10*3/uL (ref 4.0–10.5)
nRBC: 0 % (ref 0.0–0.2)

## 2018-07-26 LAB — COOXEMETRY PANEL
Carboxyhemoglobin: 1.7 % — ABNORMAL HIGH (ref 0.5–1.5)
Carboxyhemoglobin: 1.5 % (ref 0.5–1.5)
Methemoglobin: 0.9 % (ref 0.0–1.5)
Methemoglobin: 0.9 % (ref 0.0–1.5)
O2 Saturation: 98.5 %
O2 Saturation: 57.3 %
Total hemoglobin: 13.2 g/dL (ref 12.0–16.0)
Total hemoglobin: 13.8 g/dL (ref 12.0–16.0)

## 2018-07-26 LAB — SARS CORONAVIRUS 2: SARS Coronavirus 2: NOT DETECTED

## 2018-07-26 LAB — GLUCOSE, CAPILLARY
Glucose-Capillary: 128 mg/dL — ABNORMAL HIGH (ref 70–99)
Glucose-Capillary: 133 mg/dL — ABNORMAL HIGH (ref 70–99)
Glucose-Capillary: 102 mg/dL — ABNORMAL HIGH (ref 70–99)
Glucose-Capillary: 110 mg/dL — ABNORMAL HIGH (ref 70–99)

## 2018-07-26 LAB — BASIC METABOLIC PANEL
Anion gap: 11 (ref 5–15)
BUN: 47 mg/dL — ABNORMAL HIGH (ref 8–23)
CO2: 20 mmol/L — ABNORMAL LOW (ref 22–32)
Calcium: 8.7 mg/dL — ABNORMAL LOW (ref 8.9–10.3)
Chloride: 103 mmol/L (ref 98–111)
Creatinine, Ser: 2.36 mg/dL — ABNORMAL HIGH (ref 0.61–1.24)
GFR calc Af Amer: 31 mL/min — ABNORMAL LOW (ref >60)
GFR calc non Af Amer: 27 mL/min — ABNORMAL LOW (ref >60)
Glucose, Bld: 149 mg/dL — ABNORMAL HIGH (ref 70–99)
Potassium: 4.3 mmol/L (ref 3.5–5.1)
Sodium: 134 mmol/L — ABNORMAL LOW (ref 135–145)

## 2018-07-26 LAB — PROTIME-INR
INR: 1.3 — ABNORMAL HIGH (ref 0.8–1.2)
Prothrombin Time: 16.1 seconds — ABNORMAL HIGH (ref 11.4–15.2)

## 2018-07-26 LAB — HEPARIN LEVEL (UNFRACTIONATED): Heparin Unfractionated: 0.49 IU/mL (ref 0.30–0.70)

## 2018-07-26 MED ORDER — WARFARIN SODIUM 5 MG PO TABS
5.0000 mg | ORAL_TABLET | Freq: Once | ORAL | Status: AC
  Administered 2018-07-26: 17:00:00 5 mg via ORAL
  Filled 2018-07-26: qty 1

## 2018-07-26 MED ORDER — LIDOCAINE HCL 1 % IJ SOLN
INTRAMUSCULAR | Status: AC
  Filled 2018-07-26: qty 20

## 2018-07-26 MED ORDER — LIDOCAINE HCL (PF) 1 % IJ SOLN
INTRAMUSCULAR | Status: DC | PRN
  Administered 2018-07-26: 10 mL

## 2018-07-26 MED ORDER — FUROSEMIDE 10 MG/ML IJ SOLN
80.0000 mg | Freq: Once | INTRAMUSCULAR | Status: AC
  Administered 2018-07-26: 17:00:00 80 mg via INTRAVENOUS
  Filled 2018-07-26: qty 8

## 2018-07-26 MED ORDER — IVABRADINE HCL 5 MG PO TABS
2.5000 mg | ORAL_TABLET | Freq: Two times a day (BID) | ORAL | Status: DC
  Administered 2018-07-26 – 2018-07-30 (×9): 2.5 mg via ORAL
  Filled 2018-07-26 (×10): qty 1

## 2018-07-26 MED ORDER — IVABRADINE HCL 5 MG PO TABS: 5.0000 mg | ORAL_TABLET | Freq: Two times a day (BID) | ORAL | Status: DC

## 2018-07-26 NOTE — Progress Notes (Addendum)
Patient ID: Ryan [name], male   DOB: [birth year], 68 y.o.   MRN: [id number]    Advanced Heart Failure Rounding Note   Subjective:    Remains on milrinone 0.375.More SOB. + orthopnea  Co-ox inaccurate at 98%  On lasix 80 po. CVP up to 10-11  Remains tachy.    Creatinine 2.64 -> 2.70 -> 3.08 -> 3.09 -> 2.59 -> 2.36   On heparin for LV thrombus and right leg DVT. No bleeding.   Objective:   Weight Range:  Vital Signs:   Temp:  [97.5 F (36.4 C)-98.8 F (37.1 C)] 98.8 F (37.1 C) (06/18 0700) Pulse Rate:  [103] 103 (06/18 0318) Resp:  [21] 21 (06/18 0318) BP: (108-110)/(79-81) 110/81 (06/18 0318) SpO2:  [96 %-98 %] 98 % (06/18 0318) Weight:  [66.3 kg] 66.3 kg (06/18 0612) Last BM Date: 07/24/18  Weight change: Filed Weights   07/24/18 0310 07/25/18 0655 07/26/18 0612  Weight: 67.6 kg 66.9 kg 66.3 kg    Intake/Output:   Intake/Output Summary (Last 24 hours) at 07/26/2018 0801 Last data filed at 07/26/2018 4401 Gross per 24 hour  Intake 1089.98 ml  Output 1125 ml  Net -35.02 ml     Physical Exam: General:  Cachetic. Sitting in chair. . No resp difficulty HEENT: normal Neck: supple. JVP 10 Carotids 2+ bilat; no bruits. No lymphadenopathy or thryomegaly appreciated. Cor: PMI nondisplaced. Tachy Regular. Loud s3 Lungs: dull up 1/2 on R  Abdomen: soft, nontender, nondistended. No hepatosplenomegaly. No bruits or masses. Good bowel sounds. Extremities: no cyanosis, clubbing, rash, edema Neuro: alert & orientedx3, cranial nerves grossly intact. moves all 4 extremities w/o difficulty. Affect pleasant  Telemetry: Sinus tach 100-110 Personally reviewed   Labs: Basic Metabolic Panel: Recent Labs  Lab 07/21/18 0450 07/22/18 0600 07/23/18 0600 07/24/18 0500 07/25/18 0412 07/26/18 0257  NA 135 132* 131* 132* 133* 134*  K 3.6 4.1 4.8 4.4 3.9 4.3  CL 104 100 99 100 103 103  CO2 22 22 22 22 22  20*  GLUCOSE 71 81 193* 98 76 149*  BUN 56* 54* 61* 61* 52* 47*   CREATININE 2.64* 2.70* 3.08* 3.09* 2.59* 2.36*  CALCIUM 8.3* 8.3* 8.4* 8.7* 8.5* 8.7*  MG 2.0  --   --   --   --   --     Liver Function Tests: No results for input(s): AST, ALT, ALKPHOS, BILITOT, PROT, ALBUMIN in the last 168 hours. No results for input(s): LIPASE, AMYLASE in the last 168 hours. No results for input(s): AMMONIA in the last 168 hours.  CBC: Recent Labs  Lab 07/22/18 0600 07/23/18 0600 07/24/18 0500 07/25/18 0412 07/26/18 0257  WBC 5.3 6.3 4.8 4.8 5.1  HGB 12.5* 12.3* 12.5* 12.1* 12.8*  HCT 37.5* 36.5* 37.6* 36.2* 37.5*  MCV 89.1 88.8 89.1 89.2 87.2  PLT 169 143* 167 139* 155    Cardiac Enzymes: No results for input(s): CKTOTAL, CKMB, CKMBINDEX, TROPONINI in the last 168 hours.  BNP: BNP (last 3 results) No results for input(s): BNP in the last 8760 hours.  ProBNP (last 3 results) No results for input(s): PROBNP in the last 8760 hours.    Other results:  Imaging: Dg Chest 2v Repeat Same Day  Result Date: 07/25/2018 CLINICAL DATA:  Pleural effusion EXAM: CHEST - 2 VIEW SAME DAY COMPARISON:  07/18/2018 FINDINGS: RIGHT arm PICC line with tip projecting over SVC. Enlargement of cardiac silhouette with pulmonary vascular congestion. Atherosclerotic calcification aorta. Persistent RIGHT pleural effusion and basilar atelectasis, slightly  increased. Minimal pulmonary edema. No LEFT pleural effusion or pneumothorax identified. IMPRESSION: Slightly increased RIGHT pleural effusion and basilar atelectasis. Enlargement of cardiac silhouette with pulmonary vascular congestion and minimal pulmonary edema. Electronically Signed   By: Lavonia Dana M.D.   On: 07/25/2018 12:12     Medications:     Scheduled Medications: . benzonatate  100 mg Oral BID  . buPROPion  150 mg Oral Daily  . feeding supplement  1 Container Oral BID BM  . folic acid  1 mg Oral Daily  . furosemide  80 mg Oral Daily  . hydrocortisone   Rectal BID  . insulin aspart  0-15 Units Subcutaneous  TID WC  . insulin aspart  0-5 Units Subcutaneous QHS  . insulin glargine  8 Units Subcutaneous QHS  . ivabradine  2.5 mg Oral BID WC  . multivitamin with minerals  1 tablet Oral Daily  . pantoprazole  40 mg Oral Daily  . ramelteon  8 mg Oral QHS  . sodium chloride flush  10-40 mL Intracatheter Q12H  . thiamine  100 mg Oral Daily  . Warfarin - Pharmacist Dosing Inpatient   Does not apply q1800    Infusions: . ferumoxytol 510 mg (07/23/18 1143)  . heparin 1,050 Units/hr (07/25/18 1600)  . milrinone 0.375 mcg/kg/min (07/25/18 1600)    PRN Medications: acetaminophen **OR** acetaminophen, ondansetron **OR** ondansetron (ZOFRAN) IV, senna-docusate, sodium chloride flush   Assessment/Plan:   1. Acute biventricular systolic HF-> cardiogenic shock - Suspect NICM due to HTN +/- ETOH (LV not overly dilated LVIDd 58mm) - Initial co-ox 45%. Now on milrinone 0.375 with co-ox up to 62% yesterday. Repeat pending today  - CVP back up. He has very prominent s3 on exam   - On lasix 80 po daily. Will add a dose of IV lasix as well  - HR improved with low-dose ivabradine. Will continue - No b-block or ARB with low output and renal failure - If BP holds can considering restarting Bidil 0.5 tid - Will start milrinone wean to 0.25 today to see what happens but given recent trajectory and cardiac exam I doubt we will be able to wean successfully.  - Needs coronary angiography if renal function permits - doubt that will happen this admit - Unable to obtain MRI with creatinine> 1.5. - Not candidate for advanced therapies with social hx and renal failure - If fails milrinone wean will have to have SW access and see if he is candidate for home inotropes despite his past history. He seems dedicated to making changes in lifestyle  2. Acute on CKD , unknown baseline  - Creatinine 1.95 -> 2.23 -> 2.19 -> 2.39 -> 2.6 -> 2.7 -> 3.0 -> 3.1 -> 2.6 -> 2.36 - Renal US negative for hydronephrosis - See discussion  above   3. LV Apical Thrombus  - continue heparin. Will eventually need transition to warfarin. As I don't think we will get to cath this admission with renal failure. \ - Cotninue warfarin .Discussed dosing with PharmD personally.  4. RLE DVT - continue heparin/warfarin  5. Pleural Effusion -S/P R thoracentesis on 6/8 - will order repeat today. May need Pleurex tube if recurs.  - repeat CXR  5. Cirrhosis -Noted on CT this admit  -AST/ALT trending up  6. ETOH abuse - Drinks 2 pints of bourbon/day - On CIWA protocol per primary team   7. Cocaine Abuse  8. DM2  9. Acute Gout: Suspect gout flare right foot with warmth, elevated  uric acid.  - Finished prednisone burst -management per primary team     Length of Stay: 13   Daniel Bensimhon MD 07/26/2018, 8:01 AM  Advanced Heart Failure Team Pager [phone] (M-F; 7a - 4p)  Please contact [facility] for night-coverage after hours (4p -7a ) and weekends on [email or web address]

## 2018-07-26 NOTE — Progress Notes (Signed)
   Subjective:  Ryan [name] is a 68 y.o. M with PMH of alcoholic cirrhosis, [id number] admit for weakness on hospital day 2  Ryan [name] was examined and evaluated at bedside this morning and reports that he is doing well. We updated him regarding our conversation with the daughter yesterday and he states he is willing to consider moving down to [place] to be with his daughter. Also is well updated regarding the plan to wean down the milrinone today. Continuing to endorse chronic cough. He denies dizziness, lightheadedness.   Objective:  Vital signs in last 24 hours: Vitals:   07/25/18 1538 07/25/18 2314 07/26/18 0318 07/26/18 0612  BP:  108/79 110/81   Pulse:  (!) 103 (!) 103   Resp:  (!) 21 (!) 21   Temp: 98.3 F (36.8 C) 98.5 F (36.9 C)    TempSrc: Oral Oral Oral   SpO2:  96% 98%   Weight:    66.3 kg  Height:       Physical Exam  Constitutional: He is oriented to person, place, and time. No distress.  Chronically ill-appearing  Cardiovascular: Normal rate, regular rhythm, normal heart sounds and intact distal pulses.  Pulmonary/Chest: Effort normal. He has no wheezes.  Musculoskeletal: Normal range of motion.        General: No edema (minimal lower extremity edema).  Neurological: He is alert and oriented to person, place, and time.  Skin: Skin is warm and dry.   Assessment/Plan:  Principal Problem:   Weight loss, unintentional Active Problems:   Acute kidney injury ([place])   History of alcohol use disorder   Proteinuria   Lactic acidosis   Cholestasis   Thrombocytopenia (HCC)   Alcoholic cirrhosis (HCC)   Hyperkalemia   Metabolic acidosis, normal anion gap (NAG)   Pleural effusion   Acute HFrEF (heart failure with reduced ejection fraction) (HCC)  Ryan Nicholsonis a 68 y.o.Mwith PMH ofalcoholic cirrhosis, [id number] for low-output heart failure. His renal function is continuing to recover and has been making good urine on furosemide 80mg  w/ 1.2L output  yesterday. He has begun transition to warfarin per pharmacy overnight. Today, heart failure team planning to wean milrinone. His prognosis and disposition will depend on how he tolerate the milrinone taper.   Low output heart failure Left ventricular thrombus O2 sat 98 on RA. BP 110/81. Co-ox show O2 sat in 98.5 from 62 yesterday on milrinone 0.375. Possible flase reading but continued symptomatic improvement and improving renal function.  - Appreciate Heart Failure recs:  Add IV lasix, milirinone wean to 0.25. - Started on warfarin per pharmacy last night for transition from heparin drip - c/w warfarin - INR checks - Will continue goals of care discussion, may need to consult palliative  R sided pleural effusion 2/2 heart failure + cirrhosis Continues to endorse productive sputum but oxygen saturation stable. Prior thoracentesis showed transudative effusion. - C/w monitor - Repeat thoracentesis if needed  AKI on CKD Continuing to improve with good urinary output. If below creatinine of 2 may be able to tolerate contrasted studies for Ischemic work-up. Will see if stable while on milrinone taper. Creatinine 3.09->2.59->2.36 - Monitor renal function - Avoid nephrotoxic meds  RLE DVT - C/w transition to warfarin  DVT prophx: Heparin + Coumadin Diet: DYS 3 Code: DNR  Dispo: Anticipated discharge in approximately 2-3 day(s).   [name], MD 07/26/2018, 6:51 AM Pager: [phone]

## 2018-07-26 NOTE — Plan of Care (Signed)
  Problem: Education: Goal: Knowledge of General Education information will improve Description: Including pain rating scale, medication(s)/side effects and non-pharmacologic comfort measures Outcome: Progressing   Problem: Clinical Measurements: Goal: Will remain free from infection Outcome: Progressing Goal: Diagnostic test results will improve Outcome: Progressing   

## 2018-07-26 NOTE — Procedures (Signed)
PROCEDURE SUMMARY:  Successful US guided right thoracentesis. Yielded 1.5 L of clear yellow fluid. Pt tolerated procedure well. No immediate complications.  Specimen was not sent for labs. CXR ordered.  EBL < 5 mL  Ascencion Dike PA-C 07/26/2018 3:29 PM

## 2018-07-26 NOTE — Progress Notes (Signed)
ANTICOAGULATION CONSULT NOTE - Follow Up Consult  Pharmacy Consult for heparin Indication: LV thrombus and DVT   HEPARIN DW (KG): 72.9  Labs: Recent Labs    07/24/18 0500 07/25/18 0412 07/25/18 1522 07/26/18 0257  HGB 12.5* 12.1*  --  12.8*  HCT 37.6* 36.2*  --  37.5*  PLT 167 139*  --  155  LABPROT  --   --   --  16.1*  INR  --   --   --  1.3*  HEPARINUNFRC 0.52 0.27* 0.37 0.49  CREATININE 3.09* 2.59*  --  2.36*    Assessment: 68yo male subtherapeutic on heparin with initial dosing for LV thrombus and DVT. Pharmacy also consulted to start warfarin.   Heparin level therapeutic at 0.49 on heparin 1050 units/hr. Hgb stable. No signs/symptoms of bleeding.  INR 1.3 s/p warfarin 5mg  x1. Baseline INR (6/6) elevated at 1.7..   Goal of Therapy:  Heparin level 0.3-0.7 units/ml    Plan:  Continue heparin drip rate 1050 units/hr. Will be paused prior to thoracentesis.  Warfarin 5 mg x1 Daily HL, INR, CBC    Claiborne Billings, PharmD PGY2 Cardiology Pharmacy Resident Please check AMION for all Pharmacist numbers by unit 07/26/2018 8:03 AM

## 2018-07-27 LAB — GLUCOSE, CAPILLARY
Glucose-Capillary: 72 mg/dL (ref 70–99)
Glucose-Capillary: 176 mg/dL — ABNORMAL HIGH (ref 70–99)
Glucose-Capillary: 120 mg/dL — ABNORMAL HIGH (ref 70–99)
Glucose-Capillary: 128 mg/dL — ABNORMAL HIGH (ref 70–99)

## 2018-07-27 LAB — BASIC METABOLIC PANEL
Anion gap: 8 (ref 5–15)
BUN: 43 mg/dL — ABNORMAL HIGH (ref 8–23)
CO2: 26 mmol/L (ref 22–32)
Calcium: 9 mg/dL (ref 8.9–10.3)
Chloride: 102 mmol/L (ref 98–111)
Creatinine, Ser: 2.37 mg/dL — ABNORMAL HIGH (ref 0.61–1.24)
GFR calc Af Amer: 31 mL/min — ABNORMAL LOW (ref >60)
GFR calc non Af Amer: 27 mL/min — ABNORMAL LOW (ref >60)
Glucose, Bld: 69 mg/dL — ABNORMAL LOW (ref 70–99)
Potassium: 4 mmol/L (ref 3.5–5.1)
Sodium: 136 mmol/L (ref 135–145)

## 2018-07-27 LAB — COOXEMETRY PANEL
Carboxyhemoglobin: 1.3 % (ref 0.5–1.5)
Methemoglobin: 0.7 % (ref 0.0–1.5)
O2 Saturation: 67.4 %
Total hemoglobin: 12.9 g/dL (ref 12.0–16.0)

## 2018-07-27 LAB — PROTIME-INR
INR: 1.4 — ABNORMAL HIGH (ref 0.8–1.2)
Prothrombin Time: 16.5 seconds — ABNORMAL HIGH (ref 11.4–15.2)

## 2018-07-27 LAB — CBC
HCT: 37.4 % — ABNORMAL LOW (ref 39.0–52.0)
Hemoglobin: 12.5 g/dL — ABNORMAL LOW (ref 13.0–17.0)
MCH: 29.8 pg (ref 26.0–34.0)
MCHC: 33.4 g/dL (ref 30.0–36.0)
MCV: 89 fL (ref 80.0–100.0)
Platelets: 157 10*3/uL (ref 150–400)
RBC: 4.2 MIL/uL — ABNORMAL LOW (ref 4.22–5.81)
RDW: 13.9 % (ref 11.5–15.5)
WBC: 5.6 10*3/uL (ref 4.0–10.5)
nRBC: 0 % (ref 0.0–0.2)

## 2018-07-27 LAB — HEPARIN LEVEL (UNFRACTIONATED): Heparin Unfractionated: 0.36 IU/mL (ref 0.30–0.70)

## 2018-07-27 MED ORDER — WARFARIN SODIUM 5 MG PO TABS
5.0000 mg | ORAL_TABLET | Freq: Once | ORAL | Status: AC
  Administered 2018-07-27: 5 mg via ORAL
  Filled 2018-07-27: qty 1

## 2018-07-27 MED ORDER — COUMADIN BOOK
Freq: Once | Status: AC
  Administered 2018-07-27: 12:00:00
  Filled 2018-07-27: qty 1

## 2018-07-27 MED ORDER — SORBITOL 70 % SOLN
30.0000 mL | Freq: Once | Status: AC
  Administered 2018-07-27: 30 mL via ORAL
  Filled 2018-07-27: qty 30

## 2018-07-27 NOTE — Progress Notes (Signed)
ANTICOAGULATION CONSULT NOTE - Follow Up Consult  Pharmacy Consult for heparin Indication: LV thrombus and DVT   HEPARIN DW (KG): 72.9  Labs: Recent Labs    07/25/18 0412 07/25/18 1522 07/26/18 0257 07/27/18 0643  HGB 12.1*  --  12.8* 12.5*  HCT 36.2*  --  37.5* 37.4*  PLT 139*  --  155 157  LABPROT  --   --  16.1* 16.5*  INR  --   --  1.3* 1.4*  HEPARINUNFRC 0.27* 0.37 0.49 0.36  CREATININE 2.59*  --  2.36* 2.37*    Assessment: 68yo male subtherapeutic on heparin with initial dosing for LV thrombus and DVT. Pharmacy also consulted to start warfarin.   Heparin level therapeutic at 0.36, on heparin 1050 units/hr. Hgb stable. No signs/symptoms of bleeding.  INR 1.4 s/p warfarin 5mg  x1. Warfarin started on 6/17. Baseline INR (6/6) elevated at 1.7. Oral intake documented yesterday at 25%.   Goal of Therapy:  Heparin level 0.3-0.7 units/ml   Plan:  Continue heparin drip rate 1050 units/hr  Warfarin 5 mg x1 Daily HL, INR, CBC   Antonietta Jewel, PharmD, BCCCP Clinical Pharmacist  Pager: [phone] Phone: [phone] Please check AMION for all Pharmacist numbers by unit 07/27/2018 8:07 AM

## 2018-07-27 NOTE — Progress Notes (Addendum)
Physical Therapy Treatment Patient Details Name: Ryan [name] MRN: [id number] DOB: 04-17-1950 Today's Date: 07/27/2018    History of Present Illness 68 yo male w/ a PMHx notable for alcohol use disorder, diabetes, and GAD who presented with a two month history of anorexia, weight loss reported at 60lbs, malaise and intermittent abdominal pain. Found with LV thrombus and R LE DVT 6/11.     PT Comments    Pt admitted with above diagnosis. Pt currently with functional limitations due to endurance deficits. Pt was able to ambulate with RW with good stability overall.  Pt slightly unsteady without the RW.  Pt progressing well.  Met 0/3 goals originally set however pt with slower progress than anticipated.  REvised goals today.  Pt will benefit from skilled PT to increase their independence and safety with mobility to allow discharge to the venue listed below.     Follow Up Recommendations  Home health PT     Equipment Recommendations  Rolling walker with 5" wheels    Recommendations for Other Services       Precautions / Restrictions Precautions Precautions: Fall Restrictions Weight Bearing Restrictions: No    Mobility  Bed Mobility               General bed mobility comments: sitting EOB on arrival  Transfers Overall transfer level: Needs assistance Equipment used: Rolling walker (2 wheeled) Transfers: Sit to/from Stand Sit to Stand: Min guard;Min assist(min from a lower surface)         General transfer comment: cues for hand placement  Ambulation/Gait Ambulation/Gait assistance: Min guard;Supervision Gait Distance (Feet): 450 Feet Assistive device: Rolling walker (2 wheeled) Gait Pattern/deviations: Step-through pattern;Decreased stride length Gait velocity: decreased Gait velocity interpretation: 1.31 - 2.62 ft/sec, indicative of limited community ambulator General Gait Details:  At time drifting the RW but no LOB.  Notable dyspnea, but sats in the upper  90's and EHR in the 90's.  Did walk a short distance without the RW and pt steady but pt reaching with at least 1 UE support.  Discussed he can use his cane or get a RW and he really doesn't want a RW per pt.    Stairs             Wheelchair Mobility    Modified Rankin (Stroke Patients Only)       Balance Overall balance assessment: Needs assistance Sitting-balance support: No upper extremity supported;Feet supported Sitting balance-Leahy Scale: Good     Standing balance support: Bilateral upper extremity supported;During functional activity;Single extremity supported Standing balance-Leahy Scale: Poor Standing balance comment: relaint on UE support in standing                             Cognition Arousal/Alertness: Awake/alert Behavior During Therapy: WFL for tasks assessed/performed Overall Cognitive Status: Within Functional Limits for tasks assessed                                        Exercises General Exercises - Lower Extremity Ankle Circles/Pumps: (long sitting) Quad Sets: Both;10 reps;Supine;AROM Long Arc Quad: AROM;Both;10 reps;Seated Hip Flexion/Marching: AROM;Both;10 reps;Seated    General Comments General comments (skin integrity, edema, etc.): pt on RA with sats >90% with activity.  Other VSS      Pertinent Vitals/Pain Pain Assessment: No/denies pain    Home Living  Prior Function            PT Goals (current goals can now be found in the care plan section) Acute Rehab PT Goals Patient Stated Goal: to rest  PT Goal Formulation: With patient Time For Goal Achievement: 08/10/18 Potential to Achieve Goals: Good Progress towards PT goals: Progressing toward goals    Frequency    Min 3X/week      PT Plan Current plan remains appropriate    Co-evaluation              AM-PAC PT "6 Clicks" Mobility   Outcome Measure  Help needed turning from your back to your side  while in a flat bed without using bedrails?: None Help needed moving from lying on your back to sitting on the side of a flat bed without using bedrails?: None Help needed moving to and from a bed to a chair (including a wheelchair)?: A Little Help needed standing up from a chair using your arms (e.g., wheelchair or bedside chair)?: A Little Help needed to walk in hospital room?: A Little Help needed climbing 3-5 steps with a railing? : A Little 6 Click Score: 20    End of Session Equipment Utilized During Treatment: Gait belt Activity Tolerance: Patient limited by fatigue Patient left: with call bell/phone within reach;in chair Nurse Communication: Mobility status PT Visit Diagnosis: Unsteadiness on feet (R26.81);Other abnormalities of gait and mobility (R26.89)     Time: 2426-8341 PT Time Calculation (min) (ACUTE ONLY): 17 min  Charges:  $Gait Training: 8-22 mins                     [place] Pager:  [phone]  Office:  [place] 07/27/2018, 10:41 AM

## 2018-07-27 NOTE — Progress Notes (Addendum)
   Subjective:  Ryan [name] is a 68 y.o.M with PMH of alcoholic cirrhosis, G2XB admit for weakness on hospital day 14  Ryan [name] was examined and examined at bedside this morning and he states he is hungry. He is sore from the thoracentesis yesterday and reports of no significant change to his dyspnea or cough. He denies chest pain, light headedness, dizziness. He asked for a stool softner  Objective:  Vital signs in last 24 hours: Vitals:   07/26/18 1600 07/26/18 1949 07/26/18 2321 07/27/18 0304  BP: (!) 104/92 136/77 101/75 106/75  Pulse:  (!) 111    Resp: (!) 26 (!) 29 (!) 22 (!) 28  Temp:  98.4 F (36.9 C) 98.4 F (36.9 C) 98.6 F (37 C)  TempSrc:  Oral Oral   SpO2:  100%    Weight:      Height:       Gen: Well-developed, chronically ill-appearing, pleasant Pulm: CTAB, No rales, no wheezes, no dullness to percussion, thoracentesis site with minimal bleeding, drainage  Extm: ROM intact, Peripheral pulses intact, trace peripheral edema  Assessment/Plan:  Principal Problem:   Weight loss, unintentional Active Problems:   Acute kidney injury (HCC)   History of alcohol use disorder   Proteinuria   Lactic acidosis   Cholestasis   Thrombocytopenia (HCC)   Alcoholic cirrhosis (HCC)   Hyperkalemia   Metabolic acidosis, normal anion gap (NAG)   Pleural effusion   Acute HFrEF (heart failure with reduced ejection fraction) (HCC)  Ryan Nicholsonis a 68 y.o.Mwith PMH ofalcoholic cirrhosis, [id number] for low-output heart failure. He was able to tolerate step-down wean off milrinone without decompensation overnight. AKI has not improved but has not worsened either. Symptomatically appears to be improving incrementally but will need to continue close monitoring. Per heart failure, continuing milrinone wean tomorrow.  Low output heart failure Left ventricular thrombus O2 sat 98 on RA. No longer having oxygen requirement. BP 94/70  Stable vitals while milirinone  wean (down to 0.25 yesterday)  but continues to be tachycardic. Continuing transition to warfarin. INR 1.4 this am. - Appreciate Heart Failure recs: Continue diuretic, milirinone wean to 0.125 - C/w warfarin transition per pharmacy - INR checks - Will continue goals of care discussion, may need to consult palliative if failed milirinone wean  R sided pleural effusion 2/2 heart failure + cirrhosis Thoracentesis performed yesterday by IR. 1.2L removed. Chest X-ray post thoracentesis stable. - Resolved - C/w monitor for recurrence  AKI on CKD 1L urine output overnight. Unchanged from yesterday but holding despite milrinone taper. Creatinine 3.09->2.59->2.36->2.37 - Monitor renal function - Avoid nephrotoxic meds  DVT prophx: Heparin + Coumadin Diet: DYS 3 Code: DNR  Dispo: Anticipated discharge in approximately 3-4 day(s).   Ryan Anis, MD 07/27/2018, 6:30 AM Pager: [phone]

## 2018-07-27 NOTE — Care Management Important Message (Signed)
Important Message  Patient Details  Name: Ryan [name] MRN: [id number] Date of Birth: Jun 27, 1950   Medicare Important Message Given:  Yes     [name] 07/27/2018, 4:13 PM

## 2018-07-27 NOTE — Progress Notes (Signed)
Patient ID: Ryan [name], male   DOB: 18-Mar-1950, 68 y.o.   MRN: [id number]    Advanced Heart Failure Rounding Note   Subjective:    On milrinone 0.25. Feels better. Denies dyspnea, orthopnea or PND.  Co-ox 67%  On lasix 80 po. CVP 6-7    Creatinine 2.64 -> 2.70 -> 3.08 -> 3.09 -> 2.59 -> 2.36 -> 2.37   He is s/p repeat R thoracentesis yesterday  On heparin for LV thrombus and right leg DVT. Warfarin started. No bleeding.   Objective:   Weight Range:  Vital Signs:   Temp:  [97.7 F (36.5 C)-98.6 F (37 C)] 97.7 F (36.5 C) (06/19 1031) Pulse Rate:  [111-112] 112 (06/19 0304) Resp:  [22-29] 28 (06/19 0304) BP: (94-136)/(70-92) 98/76 (06/19 1031) SpO2:  [95 %-100 %] 95 % (06/19 0304) Last BM Date: 07/26/18  Weight change: Filed Weights   07/24/18 0310 07/25/18 0655 07/26/18 0612  Weight: 67.6 kg 66.9 kg 66.3 kg    Intake/Output:   Intake/Output Summary (Last 24 hours) at 07/27/2018 1332 Last data filed at 07/27/2018 1259 Gross per 24 hour  Intake 731.85 ml  Output 2800 ml  Net -2068.15 ml     Physical Exam: General:  Cachetic. Sitting in chair. . No resp difficulty HEENT: normal Neck: supple. no JVP 6. Carotids 2+ bilat; no bruits. No lymphadenopathy or thryomegaly appreciated. Cor: PMI nondisplaced. Regular  Tachy + s3 Lungs: clear Abdomen: soft, nontender, nondistended. No hepatosplenomegaly. No bruits or masses. Good bowel sounds. Extremities: no cyanosis, clubbing, rash, edema Neuro: alert & orientedx3, cranial nerves grossly intact. moves all 4 extremities w/o difficulty. Affect pleasant  Telemetry: Sinus tach 100-110 Personally reviewed   Labs: Basic Metabolic Panel: Recent Labs  Lab 07/21/18 0450  07/23/18 0600 07/24/18 0500 07/25/18 0412 07/26/18 0257 07/27/18 0643  NA 135   < > 131* 132* 133* 134* 136  K 3.6   < > 4.8 4.4 3.9 4.3 4.0  CL 104   < > 99 100 103 103 102  CO2 22   < > 22 22 22  20* 26  GLUCOSE 71   < > 193* 98 76 149* 69*    BUN 56*   < > 61* 61* 52* 47* 43*  CREATININE 2.64*   < > 3.08* 3.09* 2.59* 2.36* 2.37*  CALCIUM 8.3*   < > 8.4* 8.7* 8.5* 8.7* 9.0  MG 2.0  --   --   --   --   --   --    < > = values in this interval not displayed.    Liver Function Tests: No results for input(s): AST, ALT, ALKPHOS, BILITOT, PROT, ALBUMIN in the last 168 hours. No results for input(s): LIPASE, AMYLASE in the last 168 hours. No results for input(s): AMMONIA in the last 168 hours.  CBC: Recent Labs  Lab 07/23/18 0600 07/24/18 0500 07/25/18 0412 07/26/18 0257 07/27/18 0643  WBC 6.3 4.8 4.8 5.1 5.6  HGB 12.3* 12.5* 12.1* 12.8* 12.5*  HCT 36.5* 37.6* 36.2* 37.5* 37.4*  MCV 88.8 89.1 89.2 87.2 89.0  PLT 143* 167 139* 155 157    Cardiac Enzymes: No results for input(s): CKTOTAL, CKMB, CKMBINDEX, TROPONINI in the last 168 hours.  BNP: BNP (last 3 results) No results for input(s): BNP in the last 8760 hours.  ProBNP (last 3 results) No results for input(s): PROBNP in the last 8760 hours.    Other results:  Imaging: Dg Chest 1 View  Result Date: 07/26/2018 CLINICAL  DATA:  Status post right thoracentesis today. EXAM: CHEST  1 VIEW COMPARISON:  PA and lateral chest 07/25/2018. FINDINGS: Right pleural effusion is markedly decreased after thoracentesis. No pneumothorax. Mild right basilar atelectasis is noted. The left lung is clear. Cardiomegaly is seen. Right PICC is unchanged. IMPRESSION: Marked decrease in right effusion after thoracentesis. Negative for pneumothorax or other new abnormality. Cardiomegaly. Electronically Signed   By: Inge Rise M.D.   On: 07/26/2018 16:03   Ir Thoracentesis Asp Pleural Space W/img Guide  Result Date: 07/26/2018 INDICATION: History of heart failure. Recurrent right pleural effusion. Request therapeutic thoracentesis. EXAM: ULTRASOUND GUIDED RIGHT THORACENTESIS MEDICATIONS: None. COMPLICATIONS: None immediate. PROCEDURE: An ultrasound guided thoracentesis was thoroughly  discussed with the patient and questions answered. The benefits, risks, alternatives and complications were also discussed. The patient understands and wishes to proceed with the procedure. Written consent was obtained. Ultrasound was performed to localize and mark an adequate pocket of fluid in the right chest. The area was then prepped and draped in the normal sterile fashion. 1% Lidocaine was used for local anesthesia. Under ultrasound guidance a 6 Fr Safe-T-Centesis catheter was introduced. Thoracentesis was performed. The catheter was removed and a dressing applied. FINDINGS: A total of approximately 1.5 L of clear yellow fluid was removed. IMPRESSION: Successful ultrasound guided right thoracentesis yielding 1.5 L of pleural fluid. Read by: Ascencion Dike PA-C Electronically Signed   By: Corrie Mckusick D.O.   On: 07/26/2018 15:43     Medications:     Scheduled Medications:  benzonatate  100 mg Oral BID   buPROPion  150 mg Oral Daily   coumadin book   Does not apply Once   feeding supplement  1 Container Oral BID BM   folic acid  1 mg Oral Daily   furosemide  80 mg Oral Daily   hydrocortisone   Rectal BID   insulin aspart  0-15 Units Subcutaneous TID WC   insulin aspart  0-5 Units Subcutaneous QHS   insulin glargine  8 Units Subcutaneous QHS   ivabradine  2.5 mg Oral BID WC   multivitamin with minerals  1 tablet Oral Daily   pantoprazole  40 mg Oral Daily   ramelteon  8 mg Oral QHS   sodium chloride flush  10-40 mL Intracatheter Q12H   thiamine  100 mg Oral Daily   warfarin  5 mg Oral ONCE-1800   Warfarin - Pharmacist Dosing Inpatient   Does not apply q1800    Infusions:  ferumoxytol 510 mg (07/23/18 1143)   heparin 1,050 Units/hr (07/26/18 1126)   milrinone 0.125 mcg/kg/min (07/27/18 1147)    PRN Medications: acetaminophen **OR** acetaminophen, lidocaine (PF), ondansetron **OR** ondansetron (ZOFRAN) IV, senna-docusate, sodium chloride  flush   Assessment/Plan:   1. Acute biventricular systolic HF-> cardiogenic shock - Suspect NICM due to HTN +/- ETOH (LV not overly dilated LVIDd 81mm) - Initial co-ox 45%. - Milrinone weaned to 0.25 yesterday. Co-ox stable today. Will continue to wean to 0.125 - CVP 6 continue lasix 80 po daily - HR improved with low-dose ivabradine. Will continue - No b-block or ARB with low output and renal failure - If BP holds can considering restarting Bidil 0.5 tid - Needs coronary angiography if renal function permits - doubt that will happen this admit - Unable to obtain MRI with creatinine> 1.5. - Not candidate for advanced therapies with social hx and renal failure - If fails milrinone wean will have to have SW access and see if he is  candidate for home inotropes despite his past history. He seems dedicated to making changes in lifestyle  2. Acute on CKD , unknown baseline  - Creatinine 1.95 -> 2.23 -> 2.19 -> 2.39 -> 2.6 -> 2.7 -> 3.0 -> 3.1 -> 2.6 -> 2.36 -> 2.37 - Renal US negative for hydronephrosis - See discussion above   3. LV Apical Thrombus  - continue heparin/warfarin - No bleeding Discussed dosing with PharmD personally.  4. RLE DVT - continue heparin/warfarin  5. Pleural Effusion -S/P R thoracentesis on 6/8 with repeat 6/18 -May need Pleurex tube if recurs.  - repeat CXR  5. Cirrhosis -Noted on CT this admit  -AST/ALT trending up  6. ETOH abuse - Drinks 2 pints of bourbon/day - On CIWA protocol per primary team   7. Cocaine Abuse  8. DM2  9. Acute Gout: Suspect gout flare right foot with warmth, elevated uric acid.  - Finished prednisone burst -management per primary team     Length of Stay: 14   Britanni Yarde MD 07/27/2018, 1:32 PM  Advanced Heart Failure Team Pager [phone] (M-F; [place])  Please contact [facility] for night-coverage after hours (4p -7a ) and weekends on [email or web address]

## 2018-07-28 DIAGNOSIS — Z66 Do not resuscitate: Secondary | ICD-10-CM

## 2018-07-28 DIAGNOSIS — E1122 Type 2 diabetes mellitus with diabetic chronic kidney disease: Secondary | ICD-10-CM

## 2018-07-28 DIAGNOSIS — I5041 Acute combined systolic (congestive) and diastolic (congestive) heart failure: Secondary | ICD-10-CM

## 2018-07-28 DIAGNOSIS — R791 Abnormal coagulation profile: Secondary | ICD-10-CM

## 2018-07-28 DIAGNOSIS — I5021 Acute systolic (congestive) heart failure: Secondary | ICD-10-CM

## 2018-07-28 DIAGNOSIS — K7030 Alcoholic cirrhosis of liver without ascites: Secondary | ICD-10-CM

## 2018-07-28 LAB — COOXEMETRY PANEL
Carboxyhemoglobin: 1.1 % (ref 0.5–1.5)
Carboxyhemoglobin: 1.1 % (ref 0.5–1.5)
Methemoglobin: 0.7 % (ref 0.0–1.5)
Methemoglobin: 0.7 % (ref 0.0–1.5)
O2 Saturation: 47.3 %
O2 Saturation: 60.2 %
Total hemoglobin: 13.2 g/dL (ref 12.0–16.0)
Total hemoglobin: 14.2 g/dL (ref 12.0–16.0)

## 2018-07-28 LAB — GLUCOSE, CAPILLARY
Glucose-Capillary: 90 mg/dL (ref 70–99)
Glucose-Capillary: 216 mg/dL — ABNORMAL HIGH (ref 70–99)
Glucose-Capillary: 94 mg/dL (ref 70–99)
Glucose-Capillary: 82 mg/dL (ref 70–99)

## 2018-07-28 LAB — BASIC METABOLIC PANEL
Anion gap: 10 (ref 5–15)
BUN: 43 mg/dL — ABNORMAL HIGH (ref 8–23)
CO2: 24 mmol/L (ref 22–32)
Calcium: 8.9 mg/dL (ref 8.9–10.3)
Chloride: 101 mmol/L (ref 98–111)
Creatinine, Ser: 2.41 mg/dL — ABNORMAL HIGH (ref 0.61–1.24)
GFR calc Af Amer: 31 mL/min — ABNORMAL LOW (ref >60)
GFR calc non Af Amer: 26 mL/min — ABNORMAL LOW (ref >60)
Glucose, Bld: 96 mg/dL (ref 70–99)
Potassium: 4.1 mmol/L (ref 3.5–5.1)
Sodium: 135 mmol/L (ref 135–145)

## 2018-07-28 LAB — CBC
HCT: 37.2 % — ABNORMAL LOW (ref 39.0–52.0)
Hemoglobin: 12.3 g/dL — ABNORMAL LOW (ref 13.0–17.0)
MCH: 29.9 pg (ref 26.0–34.0)
MCHC: 33.1 g/dL (ref 30.0–36.0)
MCV: 90.3 fL (ref 80.0–100.0)
Platelets: 175 10*3/uL (ref 150–400)
RBC: 4.12 MIL/uL — ABNORMAL LOW (ref 4.22–5.81)
RDW: 14.1 % (ref 11.5–15.5)
WBC: 5.4 10*3/uL (ref 4.0–10.5)
nRBC: 0 % (ref 0.0–0.2)

## 2018-07-28 LAB — PROTIME-INR
INR: 1.3 — ABNORMAL HIGH (ref 0.8–1.2)
Prothrombin Time: 16 seconds — ABNORMAL HIGH (ref 11.4–15.2)

## 2018-07-28 LAB — HEPARIN LEVEL (UNFRACTIONATED): Heparin Unfractionated: 0.47 IU/mL (ref 0.30–0.70)

## 2018-07-28 MED ORDER — FUROSEMIDE 10 MG/ML IJ SOLN
80.0000 mg | Freq: Once | INTRAMUSCULAR | Status: AC
  Administered 2018-07-28: 16:00:00 80 mg via INTRAVENOUS
  Filled 2018-07-28: qty 8

## 2018-07-28 MED ORDER — WARFARIN SODIUM 7.5 MG PO TABS
7.5000 mg | ORAL_TABLET | Freq: Once | ORAL | Status: AC
  Administered 2018-07-28: 17:00:00 7.5 mg via ORAL
  Filled 2018-07-28: qty 1

## 2018-07-28 MED ORDER — ALTEPLASE 2 MG IJ SOLR
2.0000 mg | Freq: Once | INTRAMUSCULAR | Status: AC
  Administered 2018-07-28: 20:00:00 2 mg

## 2018-07-28 MED ORDER — ALUM & MAG HYDROXIDE-SIMETH 200-200-20 MG/5ML PO SUSP
15.0000 mL | Freq: Four times a day (QID) | ORAL | Status: DC | PRN
  Administered 2018-07-28 – 2018-07-29 (×2): 15 mL via ORAL
  Filled 2018-07-28 (×2): qty 30

## 2018-07-28 MED ORDER — ISOSORB DINITRATE-HYDRALAZINE 20-37.5 MG PO TABS
0.5000 | ORAL_TABLET | Freq: Three times a day (TID) | ORAL | Status: DC
  Administered 2018-07-28 – 2018-07-30 (×9): 0.5 via ORAL
  Filled 2018-07-28 (×9): qty 1

## 2018-07-28 NOTE — Progress Notes (Signed)
Patient ID: Ryan [name], male   DOB: [birth year], 68 y.o.   MRN: [id number]    Advanced Heart Failure Rounding Note   Subjective:    Milrinone turned down from 0.25 -> 0.125 yesterday. Feels ok. Denies dyspnea, orthopnea or PND.  Co-ox down to 47%  On lasix 80 po. CVP up to 10-11   Creatinine 2.64 -> 2.70 -> 3.08 -> 3.09 -> 2.59 -> 2.36 -> 2.37 -> 2.4  He is s/p repeat R thoracentesis 6/18  On heparin for LV thrombus and right leg DVT. Warfarin started. No bleeding. INR 1.3  Objective:   Weight Range:  Vital Signs:   Temp:  [97.5 F (36.4 C)-98.6 F (37 C)] 97.5 F (36.4 C) (06/20 0712) Pulse Rate:  [102-105] 103 (06/20 0400) Resp:  [12-31] 25 (06/20 0400) BP: (97-113)/(70-85) 113/85 (06/20 0712) SpO2:  [90 %-98 %] 97 % (06/20 0400) Weight:  [61.5 kg] 61.5 kg (06/20 0400) Last BM Date: 07/27/18  Weight change: Filed Weights   07/25/18 0655 07/26/18 0612 07/28/18 0400  Weight: 66.9 kg 66.3 kg 61.5 kg    Intake/Output:   Intake/Output Summary (Last 24 hours) at 07/28/2018 1033 Last data filed at 07/28/2018 0926 Gross per 24 hour  Intake 1243.84 ml  Output 750 ml  Net 493.84 ml     Physical Exam: General:  Weak appearing. No resp difficulty HEENT: normal Neck: supple. JVP to jaw. Carotids 2+ bilat; no bruits. No lymphadenopathy or thryomegaly appreciated. Cor: PMI nondisplaced. Regular tachy +s3 Lungs: clear with decrease right base Abdomen: soft, nontender, nondistended. No hepatosplenomegaly. No bruits or masses. Good bowel sounds. Extremities: no cyanosis, clubbing, rash, 1-2+ ankle edema R>L Neuro: alert & orientedx3, cranial nerves grossly intact. moves all 4 extremities w/o difficulty. Affect pleasant   Telemetry: Sinus tach 100-110 Personally reviewed   Labs: Basic Metabolic Panel: Recent Labs  Lab 07/24/18 0500 07/25/18 0412 07/26/18 0257 07/27/18 0643 07/28/18 0458  NA 132* 133* 134* 136 135  K 4.4 3.9 4.3 4.0 4.1  CL 100 103 103 102  101  CO2 22 22 20* 26 24  GLUCOSE 98 76 149* 69* 96  BUN 61* 52* 47* 43* 43*  CREATININE 3.09* 2.59* 2.36* 2.37* 2.41*  CALCIUM 8.7* 8.5* 8.7* 9.0 8.9    Liver Function Tests: No results for input(s): AST, ALT, ALKPHOS, BILITOT, PROT, ALBUMIN in the last 168 hours. No results for input(s): LIPASE, AMYLASE in the last 168 hours. No results for input(s): AMMONIA in the last 168 hours.  CBC: Recent Labs  Lab 07/24/18 0500 07/25/18 0412 07/26/18 0257 07/27/18 0643 07/28/18 0458  WBC 4.8 4.8 5.1 5.6 5.4  HGB 12.5* 12.1* 12.8* 12.5* 12.3*  HCT 37.6* 36.2* 37.5* 37.4* 37.2*  MCV 89.1 89.2 87.2 89.0 90.3  PLT 167 139* 155 157 175    Cardiac Enzymes: No results for input(s): CKTOTAL, CKMB, CKMBINDEX, TROPONINI in the last 168 hours.  BNP: BNP (last 3 results) No results for input(s): BNP in the last 8760 hours.  ProBNP (last 3 results) No results for input(s): PROBNP in the last 8760 hours.    Other results:  Imaging: Dg Chest 1 View  Result Date: 07/26/2018 CLINICAL DATA:  Status post right thoracentesis today. EXAM: CHEST  1 VIEW COMPARISON:  PA and lateral chest 07/25/2018. FINDINGS: Right pleural effusion is markedly decreased after thoracentesis. No pneumothorax. Mild right basilar atelectasis is noted. The left lung is clear. Cardiomegaly is seen. Right PICC is unchanged. IMPRESSION: Marked decrease in right effusion after  thoracentesis. Negative for pneumothorax or other new abnormality. Cardiomegaly. Electronically Signed   By: Inge Rise M.D.   On: 07/26/2018 16:03   Ir Thoracentesis Asp Pleural Space W/img Guide  Result Date: 07/26/2018 INDICATION: History of heart failure. Recurrent right pleural effusion. Request therapeutic thoracentesis. EXAM: ULTRASOUND GUIDED RIGHT THORACENTESIS MEDICATIONS: None. COMPLICATIONS: None immediate. PROCEDURE: An ultrasound guided thoracentesis was thoroughly discussed with the patient and questions answered. The benefits,  risks, alternatives and complications were also discussed. The patient understands and wishes to proceed with the procedure. Written consent was obtained. Ultrasound was performed to localize and mark an adequate pocket of fluid in the right chest. The area was then prepped and draped in the normal sterile fashion. 1% Lidocaine was used for local anesthesia. Under ultrasound guidance a 6 Fr Safe-T-Centesis catheter was introduced. Thoracentesis was performed. The catheter was removed and a dressing applied. FINDINGS: A total of approximately 1.5 L of clear yellow fluid was removed. IMPRESSION: Successful ultrasound guided right thoracentesis yielding 1.5 L of pleural fluid. Read by: Ascencion Dike PA-C Electronically Signed   By: Corrie Mckusick D.O.   On: 07/26/2018 15:43     Medications:     Scheduled Medications:  benzonatate  100 mg Oral BID   buPROPion  150 mg Oral Daily   feeding supplement  1 Container Oral BID BM   folic acid  1 mg Oral Daily   furosemide  80 mg Oral Daily   hydrocortisone   Rectal BID   insulin aspart  0-15 Units Subcutaneous TID WC   insulin aspart  0-5 Units Subcutaneous QHS   insulin glargine  8 Units Subcutaneous QHS   ivabradine  2.5 mg Oral BID WC   multivitamin with minerals  1 tablet Oral Daily   pantoprazole  40 mg Oral Daily   ramelteon  8 mg Oral QHS   sodium chloride flush  10-40 mL Intracatheter Q12H   thiamine  100 mg Oral Daily   warfarin  7.5 mg Oral ONCE-1800   Warfarin - Pharmacist Dosing Inpatient   Does not apply q1800    Infusions:  ferumoxytol 510 mg (07/23/18 1143)   heparin 1,050 Units/hr (07/28/18 0400)   milrinone 0.125 mcg/kg/min (07/28/18 0400)    PRN Medications: acetaminophen **OR** acetaminophen, lidocaine (PF), ondansetron **OR** ondansetron (ZOFRAN) IV, senna-docusate, sodium chloride flush   Assessment/Plan:   1. Acute biventricular systolic HF-> cardiogenic shock - Suspect NICM due to HTN +/- ETOH  (LV not overly dilated LVIDd 60mm) - Initial co-ox 45%. - Milrinone weaned to 0.125 yesterday. Co-ox down today. Will repeat today and follow. Suspect we may not be able to wean fully.  - CVP up to 10-11. Will give one dose IV lasix today on top of po - HR improved with low-dose ivabradine. Will continue - No b-block or ARB with low output and renal failure - Start Bidil 0.5 tid - Needs coronary angiography if renal function permits - doubt that will happen this admit - Unable to obtain MRI with creatinine> 1.5. - Not candidate for advanced therapies with social hx and renal failure - If fails milrinone wean will have to have SW access and see if he is candidate for home inotropes despite his past history. He seems dedicated to making changes in lifestyle  2. Acute on CKD , unknown baseline  - Creatinine 1.95 -> 2.23 -> 2.19 -> 2.39 -> 2.6 -> 2.7 -> 3.0 -> 3.1 -> 2.6 -> 2.36 -> 2.37-> 2.41 - Renal US negative  for hydronephrosis - See discussion above   3. LV Apical Thrombus  - continue heparin/warfarin  INR 1.3 - No bleeding Discussed dosing with PharmD personally.  4. RLE DVT - continue heparin/warfarin  5. Pleural Effusion -S/P R thoracentesis on 6/8 with repeat 6/18 -May need Pleurex tube if recurs.   5. Cirrhosis -Noted on CT this admit  -AST/ALT trending up  6. ETOH abuse - Drinks 2 pints of bourbon/day - On CIWA protocol per primary team   7. Cocaine Abuse  8. DM2  9. Acute Gout: Suspect gout flare right foot with warmth, elevated uric acid.  - Finished prednisone burst -management per primary team     Length of Stay: 15   Kerilyn Cortner MD 07/28/2018, 10:33 AM  Advanced Heart Failure Team Pager [phone] (M-F; [place])  Please contact [facility] for night-coverage after hours (4p -7a ) and weekends on [email or web address]

## 2018-07-28 NOTE — Progress Notes (Addendum)
   Subjective:  Ryan [name] is a 68 y.o. M with PMH of alcoholic cirrhosis, DM admit for low output heart failure on hospital day 15  Ryan [name] was examined and evaluated at bedside this AM. He states he is having concerns about his lower extremity edema. He states he was able to walk without difficulty yesterday but just wants to ask if his lower extremity edema can be treated. Also endorsing chronic cough with clear sputum. Denies any light-headedness, dyspnea, joint pain.  Objective:  Vital signs in last 24 hours: Vitals:   07/27/18 2300 07/28/18 0000 07/28/18 0400 07/28/18 0712  BP: 109/75 97/70 103/76 113/85  Pulse: (!) 104 (!) 102 (!) 103   Resp: 12 18 (!) 25   Temp: 98.5 F (36.9 C)  98.2 F (36.8 C) (!) 97.5 F (36.4 C)  TempSrc: Oral  Oral Oral  SpO2: 98% 90% 97%   Weight:   61.5 kg   Height:       Physical Exam  Constitutional: No distress.  Thin-appearing  Cardiovascular: Regular rhythm, normal heart sounds and intact distal pulses.  Tachycardic  Pulmonary/Chest: Effort normal. No respiratory distress. He has rales (Right sided basilar crackles).  Musculoskeletal: Normal range of motion.        General: Edema (1+ pitting edema around bilateral ankles R>L, no significant change from yesterday) present.     Comments: Cool lower extremities  Skin: Skin is warm and dry.   Assessment/Plan:  Principal Problem:   Weight loss, unintentional Active Problems:   Acute kidney injury ([place])   History of alcohol use disorder   Proteinuria   Lactic acidosis   Cholestasis   Thrombocytopenia (HCC)   Alcoholic cirrhosis (HCC)   Hyperkalemia   Metabolic acidosis, normal anion gap (NAG)   Pleural effusion   Acute HFrEF (heart failure with reduced ejection fraction) (HCC)  Ryan Nicholsonis a 68 y.o.Mwith PMH ofalcoholic cirrhosis, [id number] for low-output heart failure.So far able to tolerate milirinone wean down to 0.125 without significant decompensation  although extremities now feel slightly cooler. Respiratory status stable without supplemental oxygen but co-ox concerning for reduction in cardiac output. Renal function with slightly increase but stable. However continues to be hypotensive, tachycardic. Need continued close monitoring.  Low output heart failure Left ventricular thrombus O2 sat 98 on RA. BP 113/85  HR 108 while milirinone wean (down to 0.125) .cooxemetry showing O2 sat in 47.3 - unclear if accurate. Continues to be tachycardic. Continuing transition to warfarin. INR still subtherapeutic 1.3 this am. - Appreciate Heart Failure recs: repeat .cooxemetry - C/w warfarin transition per pharmacy - 7.5mg  today - INR checks - Will continue goals of care discussion, may need to consult palliative if failed milirinone wean  AKI on CKD Continuing steady 1L urine output. Rising slightly in setting of reduced milirinone. Will continue to trend. Creatinine 3.09->2.59->2.36->2.37->2.41 - Monitor renal function - Avoid nephrotoxic meds  DVT prophx:Heparin + Coumadin Diet:DYS 3 Code:DNR  Dispo: Anticipated discharge in approximately 2-3 day(s).   Ryan Anis, MD 07/28/2018, 7:22 AM Pager: [phone]

## 2018-07-28 NOTE — Progress Notes (Signed)
ANTICOAGULATION CONSULT NOTE - Follow Up Consult  Pharmacy Consult for heparin Indication: LV thrombus and DVT   HEPARIN DW (KG): 72.9  Labs: Recent Labs    07/26/18 0257 07/27/18 0643 07/28/18 0458  HGB 12.8* 12.5* 12.3*  HCT 37.5* 37.4* 37.2*  PLT 155 157 175  LABPROT 16.1* 16.5* 16.0*  INR 1.3* 1.4* 1.3*  HEPARINUNFRC 0.49 0.36 0.47  CREATININE 2.36* 2.37* 2.41*    Assessment: 68yo male subtherapeutic on heparin with initial dosing for LV thrombus and DVT. Pharmacy also consulted to start warfarin.   Heparin level therapeutic at 0.47, on heparin 1050 units/hr. Hgb stable. No signs/symptoms of bleeding.  INR remains subtherapeutic at 1.3 s/p warfarin 5mg  x3. Warfarin started on 6/17. Baseline INR (6/6) elevated at 1.7. Oral intake documented yesterday at 50-75%.   Goal of Therapy:  Heparin level 0.3-0.7 units/ml  INR 2-3   Plan:  Continue heparin drip rate 1050 units/hr  Warfarin 7.5 mg x1 Daily HL, INR, CBC    Ryan [name], PharmD PGY2 Cardiology Pharmacy Resident Please check AMION for all Pharmacist numbers by unit 07/28/2018 8:57 AM

## 2018-07-29 LAB — CBC
HCT: 38.4 % — ABNORMAL LOW (ref 39.0–52.0)
Hemoglobin: 13.2 g/dL (ref 13.0–17.0)
MCH: 30.1 pg (ref 26.0–34.0)
MCHC: 34.4 g/dL (ref 30.0–36.0)
MCV: 87.7 fL (ref 80.0–100.0)
Platelets: 198 10*3/uL (ref 150–400)
RBC: 4.38 MIL/uL (ref 4.22–5.81)
RDW: 14.2 % (ref 11.5–15.5)
WBC: 5.8 10*3/uL (ref 4.0–10.5)
nRBC: 0 % (ref 0.0–0.2)

## 2018-07-29 LAB — BASIC METABOLIC PANEL
Anion gap: 10 (ref 5–15)
BUN: 39 mg/dL — ABNORMAL HIGH (ref 8–23)
CO2: 26 mmol/L (ref 22–32)
Calcium: 9.1 mg/dL (ref 8.9–10.3)
Chloride: 99 mmol/L (ref 98–111)
Creatinine, Ser: 2.37 mg/dL — ABNORMAL HIGH (ref 0.61–1.24)
GFR calc Af Amer: 31 mL/min — ABNORMAL LOW (ref >60)
GFR calc non Af Amer: 27 mL/min — ABNORMAL LOW (ref >60)
Glucose, Bld: 74 mg/dL (ref 70–99)
Potassium: 3.9 mmol/L (ref 3.5–5.1)
Sodium: 135 mmol/L (ref 135–145)

## 2018-07-29 LAB — COOXEMETRY PANEL
Carboxyhemoglobin: 1.4 % (ref 0.5–1.5)
Methemoglobin: 0.9 % (ref 0.0–1.5)
O2 Saturation: 54 %
Total hemoglobin: 14.4 g/dL (ref 12.0–16.0)

## 2018-07-29 LAB — GLUCOSE, CAPILLARY
Glucose-Capillary: 69 mg/dL — ABNORMAL LOW (ref 70–99)
Glucose-Capillary: 73 mg/dL (ref 70–99)
Glucose-Capillary: 95 mg/dL (ref 70–99)
Glucose-Capillary: 137 mg/dL — ABNORMAL HIGH (ref 70–99)
Glucose-Capillary: 183 mg/dL — ABNORMAL HIGH (ref 70–99)

## 2018-07-29 LAB — PROTIME-INR
INR: 1.3 — ABNORMAL HIGH (ref 0.8–1.2)
Prothrombin Time: 15.9 seconds — ABNORMAL HIGH (ref 11.4–15.2)

## 2018-07-29 LAB — HEPARIN LEVEL (UNFRACTIONATED)
Heparin Unfractionated: 0.23 IU/mL — ABNORMAL LOW (ref 0.30–0.70)
Heparin Unfractionated: 0.71 IU/mL — ABNORMAL HIGH (ref 0.30–0.70)

## 2018-07-29 MED ORDER — WARFARIN SODIUM 7.5 MG PO TABS
7.5000 mg | ORAL_TABLET | Freq: Once | ORAL | Status: AC
  Administered 2018-07-29: 18:00:00 7.5 mg via ORAL
  Filled 2018-07-29: qty 1

## 2018-07-29 MED ORDER — TORSEMIDE 20 MG PO TABS: 40.0000 mg | ORAL_TABLET | Freq: Every day | ORAL | Status: DC

## 2018-07-29 MED ORDER — TORSEMIDE 20 MG PO TABS
40.0000 mg | ORAL_TABLET | Freq: Every day | ORAL | Status: DC
  Administered 2018-07-30: 40 mg via ORAL
  Filled 2018-07-29: qty 2

## 2018-07-29 MED ORDER — INSULIN ASPART 100 UNIT/ML ~~LOC~~ SOLN
0.0000 [IU] | Freq: Three times a day (TID) | SUBCUTANEOUS | Status: DC
  Administered 2018-07-31: 1 [IU] via SUBCUTANEOUS
  Administered 2018-08-01 (×2): 2 [IU] via SUBCUTANEOUS

## 2018-07-29 NOTE — Progress Notes (Signed)
   Subjective: The patient was lying in his bed today stating that he had slept well overnight. He denied dyspnea, orthopnea, chest pain, palpitations, or leg pain.  He said his feet feel well today.  The patient understands that the plan is to continue to wean the milrinone while monitoring his renal function and symptoms.  Objective:  Vital signs in last 24 hours: Vitals:   07/28/18 2000 07/28/18 2300 07/29/18 0400 07/29/18 0500  BP: 107/76 98/72 96/68    Pulse: (!) 103 100 (!) 101 (!) 108  Resp: (!) 25 (!) 24 20 20   Temp:  98.3 F (36.8 C)  (!) 97.2 F (36.2 C)  TempSrc:  Oral  Oral  SpO2: 97% 98% 96% 95%  Weight:    59.5 kg  Height:       General: A/O x4, in no acute distress, afebrile, nondiaphoretic Cardio: RRR, no mrg's  Pulmonary: CTA bilaterally, no wheezing or crackles  Abdomen: Bowel sounds normal, soft, nontender  MSK: BLE nontender, notably edematous bilaterally Psych: Appropriate affect, not depressed in appearance, engages well  Assessment/Plan:  Principal Problem:   Weight loss, unintentional Active Problems:   Acute kidney injury ([place])   History of alcohol use disorder   Proteinuria   Lactic acidosis   Cholestasis   Thrombocytopenia (HCC)   Alcoholic cirrhosis (HCC)   Hyperkalemia   Metabolic acidosis, normal anion gap (NAG)   Pleural effusion   Acute HFrEF (heart failure with reduced ejection fraction) [facility])  Patient Encounter Summary: Ryan [name] is a 68 yo M with a PMHx for alcoholic cirrhosis, type 2 diabetes mellitus with admitted for weight loss and weakness.  During admission he was determined to have biventricular heart failure with a severely reduced left ventricular ejection fraction.  The patient was placed on inotropic support in order to continue diuresis which when weaned resulted in increased serum creatinine.  The patient continues to require close monitoring at this time.  A/P: Low output heart failure Left ventricular thrombus:  O2 sats on room air continue to stay stable at 95%.  BP soft in the upper 82X systolic this morning. Would caution over diuresis given his soft BP.  Heart rates remained stable approximately 101 while the weaning milrinone.  Patient's O2 saturations 54% today giving him estimated cardiac output 2.7 L/minute.  This is relatively stable compared to the past several days although there has been significant degree of fluctuation. -Continue to wean milrinone per the heart failure team -Continue p.o. Lasix  - Appreciate heart failure's recommendations  DMII: CBG's within range, no reported symptomatic hypoglycemia but borderline levels observed. Given the extremely low propensity for long-acting insulin to cause hypoglycemia we will continue the 8 units daily instead decrease his sliding scale to sensitive. -Continue Lantus 8 units nightly -Continue SSI but decreased to sensitive with meals and nightly  Dispo: Anticipated discharge in approximately 2-3 day(s).   Kathi Ludwig, MD 07/29/2018, 6:49 AM Pager: Pager# [phone]

## 2018-07-29 NOTE — Progress Notes (Signed)
ANTICOAGULATION CONSULT NOTE - Follow Up Consult  Pharmacy Consult for Heparin Indication: LV thrombus and DVT   HEPARIN DW (KG): 72.9  Labs: Recent Labs    07/27/18 0643 07/28/18 0458 07/29/18 0601 07/29/18 0602 07/29/18 1507  HGB 12.5* 12.3*  --  13.2  --   HCT 37.4* 37.2*  --  38.4*  --   PLT 157 175  --  198  --   LABPROT 16.5* 16.0*  --  15.9*  --   INR 1.4* 1.3*  --  1.3*  --   HEPARINUNFRC 0.36 0.47 0.23*  --  0.71*  CREATININE 2.37* 2.41*  --  2.37*  --     Assessment: 68yo male on heparin  for LV thrombus and DVT. Pharmacy also consulted to start warfarin.  -INR= 1.3, heparin level= 0.71 after increase to 120 units/hr   Goal of Therapy:  Heparin level 0.3-0.7 units/ml  INR 2-3   Plan: 71 -decrease heparin to 1150 units/hr -Daily heparin level and CBC  Hildred Laser, PharmD Clinical Pharmacist **Pharmacist phone directory can now be found on [email or web address] ([email or web address]).  Listed under [place].

## 2018-07-29 NOTE — Progress Notes (Signed)
Patient ID: Ryan [name], male   DOB: Apr 18, 1950, 68 y.o.   MRN: [id number]    Advanced Heart Failure Rounding Note   Subjective:    Remains on milrinone 0.125. Initial co-ox 47% yesterday then up to 60% in afternoon.   Overnight felt worse with stomach upset. Improved with Maalox but still looks weak. Able to eat breakfast. Denies orthopnea or PND.   CVP 7-8.   Creatinine stable 2.3-2.4  On heparin for LV thrombus and right leg DVT. Warfarin started. No bleeding. INR unchanged at 1.3  Objective:   Weight Range:  Vital Signs:   Temp:  [97.2 F (36.2 C)-98.5 F (36.9 C)] 97.5 F (36.4 C) (06/21 0711) Pulse Rate:  [100-108] 108 (06/21 0500) Resp:  [19-25] 20 (06/21 0500) BP: (96-107)/(68-90) 96/71 (06/21 0711) SpO2:  [95 %-98 %] 95 % (06/21 0500) Weight:  [59.5 kg] 59.5 kg (06/21 0500) Last BM Date: 07/28/18  Weight change: Filed Weights   07/26/18 0612 07/28/18 0400 07/29/18 0500  Weight: 66.3 kg 61.5 kg 59.5 kg    Intake/Output:   Intake/Output Summary (Last 24 hours) at 07/29/2018 0902 Last data filed at 07/29/2018 0834 Gross per 24 hour  Intake 1058.6 ml  Output 2450 ml  Net -1391.4 ml     Physical Exam: General:  Weak appearing. No resp difficulty HEENT: normal Neck: supple. JVP 7. Carotids 2+ bilat; no bruits. No lymphadenopathy or thryomegaly appreciated. Cor: PMI nondisplaced. Tachy +s3 Lungs: clear Abdomen: soft, nontender, nondistended. No hepatosplenomegaly. No bruits or masses. Good bowel sounds. Extremities: no cyanosis, clubbing, rash, 1+ ankle edema R>L Neuro: alert & orientedx3, cranial nerves grossly intact. moves all 4 extremities w/o difficulty. Affect pleasant    Telemetry: Sinus tach 100-110 Personally reviewed   Labs: Basic Metabolic Panel: Recent Labs  Lab 07/25/18 0412 07/26/18 0257 07/27/18 0643 07/28/18 0458 07/29/18 0602  NA 133* 134* 136 135 135  K 3.9 4.3 4.0 4.1 3.9  CL 103 103 102 101 99  CO2 22 20* 26 24 26    GLUCOSE 76 149* 69* 96 74  BUN 52* 47* 43* 43* 39*  CREATININE 2.59* 2.36* 2.37* 2.41* 2.37*  CALCIUM 8.5* 8.7* 9.0 8.9 9.1    Liver Function Tests: No results for input(s): AST, ALT, ALKPHOS, BILITOT, PROT, ALBUMIN in the last 168 hours. No results for input(s): LIPASE, AMYLASE in the last 168 hours. No results for input(s): AMMONIA in the last 168 hours.  CBC: Recent Labs  Lab 07/25/18 0412 07/26/18 0257 07/27/18 0643 07/28/18 0458 07/29/18 0602  WBC 4.8 5.1 5.6 5.4 5.8  HGB 12.1* 12.8* 12.5* 12.3* 13.2  HCT 36.2* 37.5* 37.4* 37.2* 38.4*  MCV 89.2 87.2 89.0 90.3 87.7  PLT 139* 155 157 175 198    Cardiac Enzymes: No results for input(s): CKTOTAL, CKMB, CKMBINDEX, TROPONINI in the last 168 hours.  BNP: BNP (last 3 results) No results for input(s): BNP in the last 8760 hours.  ProBNP (last 3 results) No results for input(s): PROBNP in the last 8760 hours.    Other results:  Imaging: No results found.   Medications:     Scheduled Medications: . benzonatate  100 mg Oral BID  . buPROPion  150 mg Oral Daily  . feeding supplement  1 Container Oral BID BM  . folic acid  1 mg Oral Daily  . furosemide  80 mg Oral Daily  . hydrocortisone   Rectal BID  . insulin aspart  0-5 Units Subcutaneous QHS  . insulin aspart  0-9  Units Subcutaneous TID WC  . insulin glargine  8 Units Subcutaneous QHS  . isosorbide-hydrALAZINE  0.5 tablet Oral TID  . ivabradine  2.5 mg Oral BID WC  . multivitamin with minerals  1 tablet Oral Daily  . pantoprazole  40 mg Oral Daily  . ramelteon  8 mg Oral QHS  . sodium chloride flush  10-40 mL Intracatheter Q12H  . thiamine  100 mg Oral Daily  . warfarin  7.5 mg Oral ONCE-1800  . Warfarin - Pharmacist Dosing Inpatient   Does not apply q1800    Infusions: . ferumoxytol 510 mg (07/23/18 1143)  . heparin 1,200 Units/hr (07/29/18 0650)  . milrinone 0.125 mcg/kg/min (07/29/18 0500)    PRN Medications: acetaminophen **OR** acetaminophen,  alum & mag hydroxide-simeth, lidocaine (PF), ondansetron **OR** ondansetron (ZOFRAN) IV, senna-docusate, sodium chloride flush   Assessment/Plan:   1. Acute biventricular systolic HF-> cardiogenic shock - Suspect NICM due to HTN +/- ETOH (LV not overly dilated LVIDd 49mm) - Initial co-ox 45%. - On milrinone 0.125. Co-ox low yesterday am but then improved. Felt poorly overnight. Will repeat co-ox this am.  - CVP 7-8 after IV lasix yesterday.  - Change po lasix to toresemide 40 daily - HR improved with low-dose ivabradine. Will continue - No b-block or ARB with low output and renal failure - On Bidil 0.5 tid. BP too soft to titrate - Needs coronary angiography if renal function permits - doubt that will happen this admit - Unable to obtain MRI with creatinine> 1.5. - Not candidate for advanced therapies with social hx and renal failure - If fails milrinone wean will have to have SW access and see if he is candidate for home inotropes despite his past history. He seems dedicated to making changes in lifestyle  2. Acute on CKD , unknown baseline  - Creatinine stable at 2.3-2.4 seems like new baseline (was 1.65 in 9/19) - Renal US negative for hydronephrosis - See discussion above   3. LV Apical Thrombus  - continue heparin/warfarin  INR 1.3 - No bleeding Discussed dosing with PharmD personally.  4. RLE DVT - continue heparin/warfarin  5. Pleural Effusion -S/P R thoracentesis on 6/8 with repeat 6/18 -May need Pleurex tube if recurs.   5. Cirrhosis -Noted on CT this admit  -AST/ALT trending up  6. ETOH abuse - Drinks 2 pints of bourbon/day - On CIWA protocol per primary team   7. Cocaine Abuse  8. DM2  9. Acute Gout: Suspect gout flare right foot with warmth, elevated uric acid.  - Finished prednisone burst -management per primary team     Length of Stay: 16   Dougles Kimmey MD 07/29/2018, 9:02 AM  Advanced Heart Failure Team Pager [phone] (M-F; [place])   Please contact [facility] for night-coverage after hours (4p -7a ) and weekends on [email or web address]

## 2018-07-29 NOTE — Progress Notes (Addendum)
ANTICOAGULATION CONSULT NOTE - Follow Up Consult  Pharmacy Consult for Heparin Indication: LV thrombus and DVT   HEPARIN DW (KG): 72.9  Labs: Recent Labs    07/27/18 0643 07/28/18 0458 07/29/18 0601 07/29/18 0602  HGB 12.5* 12.3*  --  13.2  HCT 37.4* 37.2*  --  38.4*  PLT 157 175  --  198  LABPROT 16.5* 16.0*  --  15.9*  INR 1.4* 1.3*  --  1.3*  HEPARINUNFRC 0.36 0.47 0.23*  --   CREATININE 2.37* 2.41*  --  2.37*    Assessment: 68yo male subtherapeutic on heparin with initial dosing for LV thrombus and DVT. Pharmacy also consulted to start warfarin.   Heparin level therapeutic at 0.47, on heparin 1050 units/hr. Hgb stable. No signs/symptoms of bleeding.  INR remains subtherapeutic at 1.3 s/p warfarin 5mg  x3. Warfarin started on 6/17. Baseline INR (6/6) elevated at 1.7. Oral intake documented yesterday at 50-75%.   6/21 AM update:  Heparin level low this AM  No issues per RN  CBC stable  INR still 1.3  Goal of Therapy:  Heparin level 0.3-0.7 units/ml  INR 2-3   Plan:  Inc heparin to 1200 units/hr Re-check heparin level in 6-8 hours  Narda Bonds, PharmD, BCPS Clinical Pharmacist Phone: [phone]     Addendum: INR remains subtherapeutic at 1.3 s/p warfarin 7.5 x1. Oral intake poor yesterday. Warfarin 7.5 mg x1. If INR does not move, would increase dose further.   Claiborne Billings, PharmD PGY2 Cardiology Pharmacy Resident Please check AMION for all Pharmacist numbers by unit 07/29/2018 8:59 AM

## 2018-07-29 NOTE — Plan of Care (Signed)
Discussed plan of care for tonight and watching patients blood pressure closely while on Milrinone.

## 2018-07-30 ENCOUNTER — Inpatient Hospital Stay (HOSPITAL_COMMUNITY): Payer: Medicare Other

## 2018-07-30 DIAGNOSIS — R079 Chest pain, unspecified: Secondary | ICD-10-CM

## 2018-07-30 DIAGNOSIS — E119 Type 2 diabetes mellitus without complications: Secondary | ICD-10-CM

## 2018-07-30 LAB — GLUCOSE, CAPILLARY
Glucose-Capillary: 75 mg/dL (ref 70–99)
Glucose-Capillary: 75 mg/dL (ref 70–99)
Glucose-Capillary: 87 mg/dL (ref 70–99)
Glucose-Capillary: 84 mg/dL (ref 70–99)

## 2018-07-30 LAB — BASIC METABOLIC PANEL
Anion gap: 9 (ref 5–15)
BUN: 39 mg/dL — ABNORMAL HIGH (ref 8–23)
CO2: 25 mmol/L (ref 22–32)
Calcium: 9.1 mg/dL (ref 8.9–10.3)
Chloride: 101 mmol/L (ref 98–111)
Creatinine, Ser: 2.54 mg/dL — ABNORMAL HIGH (ref 0.61–1.24)
GFR calc Af Amer: 29 mL/min — ABNORMAL LOW (ref >60)
GFR calc non Af Amer: 25 mL/min — ABNORMAL LOW (ref >60)
Glucose, Bld: 101 mg/dL — ABNORMAL HIGH (ref 70–99)
Potassium: 4 mmol/L (ref 3.5–5.1)
Sodium: 135 mmol/L (ref 135–145)

## 2018-07-30 LAB — TROPONIN I
Troponin I: 0.25 ng/mL (ref <0.03)
Troponin I: 0.23 ng/mL (ref <0.03)

## 2018-07-30 LAB — CBC
HCT: 38.6 % — ABNORMAL LOW (ref 39.0–52.0)
Hemoglobin: 13.2 g/dL (ref 13.0–17.0)
MCH: 29.9 pg (ref 26.0–34.0)
MCHC: 34.2 g/dL (ref 30.0–36.0)
MCV: 87.5 fL (ref 80.0–100.0)
Platelets: 203 10*3/uL (ref 150–400)
RBC: 4.41 MIL/uL (ref 4.22–5.81)
RDW: 14.3 % (ref 11.5–15.5)
WBC: 8.2 10*3/uL (ref 4.0–10.5)
nRBC: 0 % (ref 0.0–0.2)

## 2018-07-30 LAB — PROTIME-INR
INR: 1.8 — ABNORMAL HIGH (ref 0.8–1.2)
Prothrombin Time: 20.2 seconds — ABNORMAL HIGH (ref 11.4–15.2)

## 2018-07-30 LAB — HEPARIN LEVEL (UNFRACTIONATED): Heparin Unfractionated: 0.61 IU/mL (ref 0.30–0.70)

## 2018-07-30 LAB — COOXEMETRY PANEL
Carboxyhemoglobin: 1 % (ref 0.5–1.5)
Methemoglobin: 0.8 % (ref 0.0–1.5)
O2 Saturation: 60.4 %
Total hemoglobin: 14 g/dL (ref 12.0–16.0)

## 2018-07-30 MED ORDER — TRAMADOL HCL 50 MG PO TABS
50.0000 mg | ORAL_TABLET | Freq: Two times a day (BID) | ORAL | Status: DC | PRN
  Administered 2018-07-30 – 2018-07-31 (×3): 50 mg via ORAL
  Filled 2018-07-30 (×3): qty 1

## 2018-07-30 MED ORDER — WARFARIN SODIUM 5 MG PO TABS
5.0000 mg | ORAL_TABLET | Freq: Once | ORAL | Status: AC
  Administered 2018-07-30: 17:00:00 5 mg via ORAL
  Filled 2018-07-30: qty 1

## 2018-07-30 MED ORDER — INSULIN GLARGINE 100 UNIT/ML ~~LOC~~ SOLN
4.0000 [IU] | Freq: Every day | SUBCUTANEOUS | Status: DC
  Administered 2018-07-30 – 2018-07-31 (×2): 4 [IU] via SUBCUTANEOUS
  Filled 2018-07-30 (×3): qty 0.04

## 2018-07-30 NOTE — Progress Notes (Addendum)
ANTICOAGULATION CONSULT NOTE - Follow Up Consult  Pharmacy Consult for Heparin Indication: LV thrombus and DVT   HEPARIN DW (KG): 72.9  Labs: Recent Labs    07/28/18 0458 07/29/18 0601 07/29/18 0602 07/29/18 1507 07/30/18 0419  HGB 12.3*  --  13.2  --  13.2  HCT 37.2*  --  38.4*  --  38.6*  PLT 175  --  198  --  203  LABPROT 16.0*  --  15.9*  --  20.2*  INR 1.3*  --  1.3*  --  1.8*  HEPARINUNFRC 0.47 0.23*  --  0.71* 0.61  CREATININE 2.41*  --  2.37*  --  2.54*    Assessment: 68 yo male currently therapeutic on heparin for LV thrombus and DVT. Pharmacy also consulted to start warfarin.   Heparin level 0.61 currently at goal on heparin 1150 units/hr. Hgb 13.2. PLT 203. No signs/symptoms of bleeding per notes.  INR remains subtherapeutic at 1.8 but shows large increase from 1.3 yesterday. Current warfarin dose of 7.5mg . Warfarin started on 6/17. Baseline INR (6/6) elevated at 1.7. Oral intake documented yesterday at 20-75%.   Goal of Therapy:  Heparin level 0.3-0.7 units/ml  INR 2-3   Plan:  Continue heparin to 1150 units/hr Reduce warfarin to 5 mg once daily.  Recheck heparin level and INR with AM labs.  Onnie Boer; PharmD Candidate 07/30/2018 9:42 AM

## 2018-07-30 NOTE — Progress Notes (Signed)
Physical Therapy Treatment Patient Details Name: Ryan [name] MRN: [id number] DOB: [birth year] Today's Date: 07/30/2018    History of Present Illness 68 yo male w/ a PMHx notable for alcohol use disorder, diabetes, and GAD who presented with a two month history of anorexia, weight loss reported at 60lbs, malaise and intermittent abdominal pain. Found with LV thrombus and R LE DVT 6/11.     PT Comments    Pt admitted with above diagnosis. Pt currently with functional limitations due to balance and endurance deficits. Pt was able to transfer to chair with min assist and performed some LE exercises.  Did not want to ambulate today due to fatigue and nursing stated pt has had a rough day.  Will follow acutely.  Pt will benefit from skilled PT to increase their independence and safety with mobility to allow discharge to the venue listed below.     Follow Up Recommendations  Home health PT     Equipment Recommendations  Rolling walker with 5" wheels    Recommendations for Other Services       Precautions / Restrictions Precautions Precautions: Fall Restrictions Weight Bearing Restrictions: No    Mobility  Bed Mobility               General bed mobility comments: sitting EOB on arrival  Transfers Overall transfer level: Needs assistance Equipment used: Rolling walker (2 wheeled) Transfers: Sit to/from [facility] Sit to Stand: Min guard;Min assist(min from a lower surface) Stand pivot transfers: Min assist       General transfer comment: Pt states he can't breathe well on arrival and wanted oxygen.  Sats 94% on RA.  Called nurse who stated she would put some on for a bit. Pt DOE 2/4 at rest.  Pt needed cues for hand placement with transfers.  Needed steadying assist to step to chair from bed.  Pt only agreed to get into chair. Once in chair , placed O2 on at 2L.  Sats 98% and pt breathing 1/4.   Ambulation/Gait             General Gait Details:  refused to ambulate due to fatigue.  Nurse stated pt has had rough day.    Stairs             Wheelchair Mobility    Modified Rankin (Stroke Patients Only)       Balance Overall balance assessment: Needs assistance Sitting-balance support: No upper extremity supported;Feet supported Sitting balance-Leahy Scale: Good     Standing balance support: Bilateral upper extremity supported;During functional activity;Single extremity supported Standing balance-Leahy Scale: Poor Standing balance comment: reliant on UE support in standing                             Cognition Arousal/Alertness: Awake/alert Behavior During Therapy: WFL for tasks assessed/performed Overall Cognitive Status: Within Functional Limits for tasks assessed                                        Exercises General Exercises - Lower Extremity Ankle Circles/Pumps: AROM;Both;20 reps;Supine Long Arc Quad: AROM;Both;10 reps;Seated Hip Flexion/Marching: AROM;Both;10 reps;Seated    General Comments        Pertinent Vitals/Pain Pain Assessment: No/denies pain    Home Living  Prior Function            PT Goals (current goals can now be found in the care plan section) Acute Rehab PT Goals Patient Stated Goal: to rest  Progress towards PT goals: Progressing toward goals    Frequency    Min 3X/week      PT Plan Current plan remains appropriate    Co-evaluation              AM-PAC PT "6 Clicks" Mobility   Outcome Measure  Help needed turning from your back to your side while in a flat bed without using bedrails?: None Help needed moving from lying on your back to sitting on the side of a flat bed without using bedrails?: None Help needed moving to and from a bed to a chair (including a wheelchair)?: A Little Help needed standing up from a chair using your arms (e.g., wheelchair or bedside chair)?: A Little Help needed to walk in  hospital room?: A Little Help needed climbing 3-5 steps with a railing? : A Little 6 Click Score: 20    End of Session Equipment Utilized During Treatment: Gait belt;Oxygen Activity Tolerance: Patient limited by fatigue Patient left: with call bell/phone within reach;in chair;with chair alarm set Nurse Communication: Mobility status PT Visit Diagnosis: Unsteadiness on feet (R26.81);Other abnormalities of gait and mobility (R26.89)     Time: 2446-9507 PT Time Calculation (min) (ACUTE ONLY): 11 min  Charges:  $Therapeutic Activity: 8-22 mins                     [place] Pager:  [phone]  Office:  [place] 07/30/2018, 4:35 PM

## 2018-07-30 NOTE — Progress Notes (Addendum)
   Subjective:  Ryan [name] is a 68 y.o. M with PMH of alcoholic cirrhosis, [id number] for low-output heart failure on hospital day 17  [name] was examined and evaluated at bedside this AM. He states that around 8:30 pm, he began experiencing Left sided abdomen/chest pain with a sensation of "someone sitting on his chest." He denies shortness of breath, nausea, vomiting, diaphoresis. His pain is exacerbated with cough and seems pleuritic in nature. We continued conversation regarding goals of care. Discussed in detail about focusing on providing comfort rather than continuing treatment and discussed running scarce on alternative therapies. He states he 'wants everything done' and mentions that he is committed to 'getting better.' Denies any diaphoresis, dyspnea, nausea, vomiting.  Objective:  Vital signs in last 24 hours: Vitals:   07/30/18 0348 07/30/18 0500 07/30/18 0725 07/30/18 1053  BP:   112/87 112/84  Pulse:  (!) 106 (!) 105 (!) 101  Resp:  (!) 31 (!) 26 (!) 26  Temp:   98.1 F (36.7 C) (!) 97.4 F (36.3 C)  TempSrc:   Oral Oral  SpO2:  96% 98% 97%  Weight: 59 kg     Height:       Gen: Well-developed, chronically ill-appearing, in distress during cough due to pain CV: Tachycardic, regular rhythm, no murmur Pulm: CTAB, No rales, no wheezes, no dullness to percussion, Left lower anterior chest wall tenderness to palpation Skin: Dry, Warm, normal turgor, no rashes, lesions, wounds.  Neuro: AAOx3  Assessment/Plan:  Principal Problem:   Weight loss, unintentional Active Problems:   Acute kidney injury ([place])   History of alcohol use disorder   Proteinuria   Lactic acidosis   Cholestasis   Thrombocytopenia (HCC)   Alcoholic cirrhosis (HCC)   Hyperkalemia   Metabolic acidosis, normal anion gap (NAG)   Pleural effusion   Acute HFrEF (heart failure with reduced ejection fraction) (HCC)  Ryan [name] is a 68 yo M with a PMHx for alcoholic cirrhosis,  type 2 diabetes mellitus with admitted for weight loss and weakness 2/2 low output heart failure. Endorsing new chest pain this AM which appears to be musculoskeletal in nature. Over the weekend failed milirinone taper at .184mcg due to further weakness and worsening AKI. Will discuss with heart failure regarding dispo.   A/P: Chest Pain 2/2 MSK strain from coughing Described as left side chest pressure on the 'left lower ribs' without radiation, exacerbated by cough. Most likely musculoskeletal strain from frequent coughs. However high risk due to hx of thromboembolic event (L ventricular thrombus, DVT) although currently on heparin and warfarin (subtherapeutic INR at the moment) On obvious effusion on exam. - repeat EKG, trop (don't need to trend if normal) - Per HF, get chest X-ray to r/o pneumo - C/w tessalon pearls for cough  Low output heart failure Left ventricular thrombus: O2 sat 98 on RA. BP 112/87. Co-ox show venous O2 sat 60.4% (stable since yesterday) Put out 1.2L on lasix 80 yesterday. Switched to torsemide this am. HR stable but elevated at 105. Milirinone taper paused during weekend due to worsening weekend and AKI. Creatinine 2.37->2.54 this AM.   - Appreciate heart failure recs - Continue p.o. torsemide 40mg  - Continue goals of care discussion  DVT prophx: heparin + Coumadin Diet: DYS 3 Code: DNR  Dispo: Anticipated discharge in approximately 2-3 day(s).   Ryan Anis, MD 07/30/2018, 11:38 AM Pager: [phone]

## 2018-07-30 NOTE — Progress Notes (Signed)
OT Cancellation Note  Patient Details Name: Ryan [name] MRN: [id number] DOB: January 05, 1951   Cancelled Treatment:    Reason Eval/Treat Not Completed: Patient declined, no reason specified; pt resting in bed and politely requesting therapist to return later today.  Will follow and see as able.  Delight Stare, OT Acute Rehabilitation Services Pager [phone] Office [phone]   Delight Stare 07/30/2018, 8:50 AM

## 2018-07-30 NOTE — Care Management Important Message (Signed)
Important Message  Patient Details  Name: Ryan [name] MRN: [id number] Date of Birth: 14-Jul-1950   Medicare Important Message Given:  Yes     [name] 07/30/2018, 3:10 PM

## 2018-07-30 NOTE — Progress Notes (Signed)
Patient ID: Ryan [name], male   DOB: February 09, 1950, 68 y.o.   MRN: [id number]    Advanced Heart Failure Rounding Note   Subjective:    Remains on milrinone 0.125.Co-ox 60%.  Says he doesn't feel well today. Hurts when he takes a deep breath. Feels tired. No orthopnea or PND.   CVP 6  Creatinine stable 2.4-> 2.5  On heparin for LV thrombus and right leg DVT. Warfarin started. No bleeding INR 1.8  Objective:   Weight Range:  Vital Signs:   Temp:  [97.4 F (36.3 C)-98.5 F (36.9 C)] 97.4 F (36.3 C) (06/22 1053) Pulse Rate:  [101-107] 101 (06/22 1053) Resp:  [17-31] 26 (06/22 1053) BP: (99-112)/(68-87) 112/84 (06/22 1053) SpO2:  [91 %-99 %] 97 % (06/22 1053) Weight:  [59 kg] 59 kg (06/22 0348) Last BM Date: 07/28/18  Weight change: Filed Weights   07/28/18 0400 07/29/18 0500 07/30/18 0348  Weight: 61.5 kg 59.5 kg 59 kg    Intake/Output:   Intake/Output Summary (Last 24 hours) at 07/30/2018 1150 Last data filed at 07/30/2018 1053 Gross per 24 hour  Intake 631.02 ml  Output 1325 ml  Net -693.98 ml     Physical Exam: General:  Weak appearing. No resp difficulty HEENT: normal Neck: supple. JVP 6. Carotids 2+ bilat; no bruits. No lymphadenopathy or thryomegaly appreciated. Cor: PMI nondisplaced. Regular rate & rhythm. No rubs, gallops or murmurs. Lungs: clear dull at R base Abdomen: soft, nontender, nondistended. No hepatosplenomegaly. No bruits or masses. Good bowel sounds. Extremities: no cyanosis, clubbing, rash, edema Neuro: alert & orientedx3, cranial nerves grossly intact. moves all 4 extremities w/o difficulty. Affect pleasant   Telemetry: Sinus tach 100-110 Personally reviewed   Labs: Basic Metabolic Panel: Recent Labs  Lab 07/26/18 0257 07/27/18 0643 07/28/18 0458 07/29/18 0602 07/30/18 0419  NA 134* 136 135 135 135  K 4.3 4.0 4.1 3.9 4.0  CL 103 102 101 99 101  CO2 20* 26 24 26 25   GLUCOSE 149* 69* 96 74 101*  BUN 47* 43* 43* 39* 39*   CREATININE 2.36* 2.37* 2.41* 2.37* 2.54*  CALCIUM 8.7* 9.0 8.9 9.1 9.1    Liver Function Tests: No results for input(s): AST, ALT, ALKPHOS, BILITOT, PROT, ALBUMIN in the last 168 hours. No results for input(s): LIPASE, AMYLASE in the last 168 hours. No results for input(s): AMMONIA in the last 168 hours.  CBC: Recent Labs  Lab 07/26/18 0257 07/27/18 0643 07/28/18 0458 07/29/18 0602 07/30/18 0419  WBC 5.1 5.6 5.4 5.8 8.2  HGB 12.8* 12.5* 12.3* 13.2 13.2  HCT 37.5* 37.4* 37.2* 38.4* 38.6*  MCV 87.2 89.0 90.3 87.7 87.5  PLT 155 157 175 198 203    Cardiac Enzymes: No results for input(s): CKTOTAL, CKMB, CKMBINDEX, TROPONINI in the last 168 hours.  BNP: BNP (last 3 results) No results for input(s): BNP in the last 8760 hours.  ProBNP (last 3 results) No results for input(s): PROBNP in the last 8760 hours.    Other results:  Imaging: No results found.   Medications:     Scheduled Medications: . benzonatate  100 mg Oral BID  . buPROPion  150 mg Oral Daily  . feeding supplement  1 Container Oral BID BM  . folic acid  1 mg Oral Daily  . hydrocortisone   Rectal BID  . insulin aspart  0-5 Units Subcutaneous QHS  . insulin aspart  0-9 Units Subcutaneous TID WC  . insulin glargine  4 Units Subcutaneous QHS  .  isosorbide-hydrALAZINE  0.5 tablet Oral TID  . ivabradine  2.5 mg Oral BID WC  . multivitamin with minerals  1 tablet Oral Daily  . pantoprazole  40 mg Oral Daily  . ramelteon  8 mg Oral QHS  . sodium chloride flush  10-40 mL Intracatheter Q12H  . thiamine  100 mg Oral Daily  . torsemide  40 mg Oral Daily  . warfarin  5 mg Oral ONCE-1800  . Warfarin - Pharmacist Dosing Inpatient   Does not apply q1800    Infusions: . ferumoxytol 510 mg (07/23/18 1143)  . heparin 1,150 Units/hr (07/30/18 0348)    PRN Medications: acetaminophen **OR** acetaminophen, alum & mag hydroxide-simeth, lidocaine (PF), ondansetron **OR** ondansetron (ZOFRAN) IV, senna-docusate,  sodium chloride flush, traMADol   Assessment/Plan:   1. Acute biventricular systolic HF-> cardiogenic shock - Suspect NICM due to HTN +/- ETOH (LV not overly dilated LVIDd 105mm) - Initial co-ox 45%. - On milrinone 0.125. Co-ox low yesterday am but then improved. Now 60%. Will stop milrinone and see how he does.   - CVP 6. Continue torsemide 40 daily. Watch renal function closely.  - Change po lasix to toresemide 40 daily - HR improved with low-dose ivabradine. Will continue - No b-block or ARB with low output and renal failure - On Bidil 0.5 tid. BP too soft to titrate - Needs coronary angiography if renal function permits - doubt that will happen this admit - Unable to obtain MRI with creatinine> 1.5. - Not candidate for advanced therapies with social hx and renal failure - If fails milrinone wean will have to have SW access and see if he is candidate for home inotropes despite his past history. He seems dedicated to making changes in lifestyle  2. Acute on CKD , unknown baseline  - Creatinine stable at 2.3-2.4 seems like new baseline (was 1.65 in 9/19). Up to 2.5 today.  - Renal US negative for hydronephrosis - Continue to follow closely - Will need outpatient Renal f/u  3. LV Apical Thrombus  - continue heparin/warfarin  INR 1.8 - No bleeding Discussed dosing with PharmD personally.  4. RLE DVT - continue heparin/warfarin  5. Pleural Effusion -S/P R thoracentesis on 6/8 with repeat 6/18 -May need Pleurex tube if recurs.  - Having pleuritic CP today. Will get CXR to evaluate for PTX  5. Cirrhosis -Noted on CT this admit  -AST/ALT trending up  6. ETOH abuse - Drinks 2 pints of bourbon/day - On CIWA protocol per primary team   7. Cocaine Abuse  8. DM2  9. Acute Gout: Suspect gout flare right foot with warmth, elevated uric acid.  - Finished prednisone burst -management per primary team     Length of Stay: 17   Glori Bickers MD 07/30/2018, 11:50 AM   Advanced Heart Failure Team Pager [phone] (M-F; [place])  Please contact [facility] for night-coverage after hours (4p -7a ) and weekends on [email or web address]

## 2018-07-31 DIAGNOSIS — I240 Acute coronary thrombosis not resulting in myocardial infarction: Secondary | ICD-10-CM | POA: Diagnosis present

## 2018-07-31 DIAGNOSIS — E43 Unspecified severe protein-calorie malnutrition: Secondary | ICD-10-CM | POA: Insufficient documentation

## 2018-07-31 DIAGNOSIS — I82401 Acute embolism and thrombosis of unspecified deep veins of right lower extremity: Secondary | ICD-10-CM | POA: Diagnosis present

## 2018-07-31 DIAGNOSIS — I959 Hypotension, unspecified: Secondary | ICD-10-CM

## 2018-07-31 LAB — BASIC METABOLIC PANEL
Anion gap: 12 (ref 5–15)
BUN: 37 mg/dL — ABNORMAL HIGH (ref 8–23)
CO2: 23 mmol/L (ref 22–32)
Calcium: 9.1 mg/dL (ref 8.9–10.3)
Chloride: 100 mmol/L (ref 98–111)
Creatinine, Ser: 2.51 mg/dL — ABNORMAL HIGH (ref 0.61–1.24)
GFR calc Af Amer: 29 mL/min — ABNORMAL LOW (ref >60)
GFR calc non Af Amer: 25 mL/min — ABNORMAL LOW (ref >60)
Glucose, Bld: 96 mg/dL (ref 70–99)
Potassium: 4.4 mmol/L (ref 3.5–5.1)
Sodium: 135 mmol/L (ref 135–145)

## 2018-07-31 LAB — GLUCOSE, CAPILLARY
Glucose-Capillary: 77 mg/dL (ref 70–99)
Glucose-Capillary: 135 mg/dL — ABNORMAL HIGH (ref 70–99)
Glucose-Capillary: 117 mg/dL — ABNORMAL HIGH (ref 70–99)
Glucose-Capillary: 76 mg/dL (ref 70–99)
Glucose-Capillary: 175 mg/dL — ABNORMAL HIGH (ref 70–99)

## 2018-07-31 LAB — CBC
HCT: 41.2 % (ref 39.0–52.0)
Hemoglobin: 13.9 g/dL (ref 13.0–17.0)
MCH: 29.6 pg (ref 26.0–34.0)
MCHC: 33.7 g/dL (ref 30.0–36.0)
MCV: 87.7 fL (ref 80.0–100.0)
Platelets: 201 10*3/uL (ref 150–400)
RBC: 4.7 MIL/uL (ref 4.22–5.81)
RDW: 14.4 % (ref 11.5–15.5)
WBC: 8.9 10*3/uL (ref 4.0–10.5)
nRBC: 0 % (ref 0.0–0.2)

## 2018-07-31 LAB — COOXEMETRY PANEL
Carboxyhemoglobin: 1.2 % (ref 0.5–1.5)
Carboxyhemoglobin: 1 % (ref 0.5–1.5)
Methemoglobin: 0.9 % (ref 0.0–1.5)
Methemoglobin: 0.7 % (ref 0.0–1.5)
O2 Saturation: 92.9 %
O2 Saturation: 54.3 %
Total hemoglobin: 14 g/dL (ref 12.0–16.0)
Total hemoglobin: 17 g/dL — ABNORMAL HIGH (ref 12.0–16.0)

## 2018-07-31 LAB — PROTIME-INR
INR: 2.4 — ABNORMAL HIGH (ref 0.8–1.2)
Prothrombin Time: 25.5 seconds — ABNORMAL HIGH (ref 11.4–15.2)

## 2018-07-31 LAB — TROPONIN I: Troponin I: 0.23 ng/mL (ref <0.03)

## 2018-07-31 LAB — HEPARIN LEVEL (UNFRACTIONATED): Heparin Unfractionated: 0.66 IU/mL (ref 0.30–0.70)

## 2018-07-31 MED ORDER — TORSEMIDE 20 MG PO TABS
20.0000 mg | ORAL_TABLET | Freq: Every day | ORAL | Status: DC
  Filled 2018-07-31: qty 1

## 2018-07-31 MED ORDER — IVABRADINE HCL 5 MG PO TABS
5.0000 mg | ORAL_TABLET | Freq: Two times a day (BID) | ORAL | Status: DC
  Administered 2018-07-31 – 2018-08-01 (×2): 5 mg via ORAL
  Filled 2018-07-31 (×2): qty 1

## 2018-07-31 MED ORDER — WARFARIN SODIUM 5 MG PO TABS
5.0000 mg | ORAL_TABLET | Freq: Once | ORAL | Status: AC
  Administered 2018-07-31: 18:00:00 5 mg via ORAL
  Filled 2018-07-31: qty 1

## 2018-07-31 MED ORDER — TORSEMIDE 20 MG PO TABS
40.0000 mg | ORAL_TABLET | Freq: Every day | ORAL | Status: DC
  Administered 2018-08-01: 40 mg via ORAL
  Filled 2018-07-31: qty 2

## 2018-07-31 MED ORDER — ORAL CARE MOUTH RINSE
15.0000 mL | Freq: Two times a day (BID) | OROMUCOSAL | Status: DC
  Administered 2018-07-31: 15 mL via OROMUCOSAL

## 2018-07-31 MED ORDER — CHLORHEXIDINE GLUCONATE 0.12 % MT SOLN
15.0000 mL | Freq: Two times a day (BID) | OROMUCOSAL | Status: DC
  Administered 2018-07-31 – 2018-08-01 (×3): 15 mL via OROMUCOSAL
  Filled 2018-07-31 (×3): qty 15

## 2018-07-31 MED ORDER — ENSURE ENLIVE PO LIQD
237.0000 mL | Freq: Three times a day (TID) | ORAL | Status: DC
  Administered 2018-07-31 – 2018-08-01 (×2): 237 mL via ORAL

## 2018-07-31 MED ORDER — HYDRALAZINE HCL 25 MG PO TABS
12.5000 mg | ORAL_TABLET | Freq: Three times a day (TID) | ORAL | Status: DC
  Administered 2018-07-31 (×3): 12.5 mg via ORAL
  Filled 2018-07-31 (×5): qty 0.5

## 2018-07-31 MED ORDER — ISOSORBIDE MONONITRATE ER 30 MG PO TB24
15.0000 mg | ORAL_TABLET | Freq: Every day | ORAL | Status: DC
  Administered 2018-07-31: 10:00:00 15 mg via ORAL
  Filled 2018-07-31: qty 1

## 2018-07-31 NOTE — Progress Notes (Signed)
Nutrition Follow-up  DOCUMENTATION CODES:   Severe malnutrition in context of chronic illness  INTERVENTION:  - Continue MVI with minerals daily  - d/c Boost Breeze as pt is refusing  - Ensure Enlive po TID, each supplement provides 350 kcal and 20 grams of protein  - Continue Magic cup TID with meals, each supplement provides 290 kcal and 9 grams of protein  - Continue double protein BID at lunch and dinner meals  NUTRITION DIAGNOSIS:   Severe Malnutrition related to chronic illness (CHF, hepatitis C) as evidenced by severe fat depletion, severe muscle depletion.  New diagnosis after completion of NFPE  GOAL:   Patient will meet greater than or equal to 90% of their needs  Progressing  MONITOR:   PO intake, Labs, I & O's, Supplement acceptance, Weight trends  REASON FOR ASSESSMENT:   Consult Poor PO  ASSESSMENT:   68 yo male w/ a PMHx notable for alcohol use disorder, substance abuse, cirrhosis, Hep C, diabetes who presents with wt loss, dysphaiga, nausea and abdominal pain x 2 months.   60/8 - s/pthoracentesis 6/18 - s/p thoracentesis with 1.5 L fluid removed  Pt now off milrinone. Weight down a total of 23.8 lbs since admit. Suspect some of weight loss related to diuresis.  Spoke with pt at bedside. Pt with untouched lunch tray at bedside. Pt states he does not plan on eating any lunch due to not having an appetite. Pt does plan on  drinking the Ensure Enlive at bedside.  Pt states that he did not eat anything for breakfast this morning due to no appetite. Pt states "this new medication is messing with my appetite."  RD educated pt on the importance of adequate kcal and protein intake in preventing further dry weight loss and maintaining lean muscle mass. Pt expresses understanding. RD encouraged pt to at least try to eat from meal trays rather than not trying at all.  Pt amenable to continuing to receive Ensure Enlive (chocolate flavor) during  admission.  Meal Completion: 0-75% x last 8 recorded meals (averaging 46%)  Medications reviewed and include: Boost Breeze BID (pt refusing), folic acid, SSI, Lantus 4 units daily, MVI with minerals, Protonix, thiamine, Torsemide, warfarin  Labs reviewed. CBG's: 77-135 x 24 hours  UOP: 675 ml x 24 hours I/O's: +1.1 L since admit  NUTRITION - FOCUSED PHYSICAL EXAM:    Most Recent Value  Orbital Region  Severe depletion  Upper Arm Region  Severe depletion  Thoracic and Lumbar Region  Severe depletion  Buccal Region  Moderate depletion  Temple Region  Severe depletion  Clavicle Bone Region  Severe depletion  Clavicle and Acromion Bone Region  Severe depletion  Scapular Bone Region  Severe depletion  Dorsal Hand  Moderate depletion  Patellar Region  Severe depletion  Anterior Thigh Region  Severe depletion  Posterior Calf Region  Severe depletion  Edema (RD Assessment)  None  Hair  Reviewed  Eyes  Reviewed  Mouth  Reviewed  Skin  Reviewed  Nails  Reviewed       Diet Order:   Diet Order            DIET DYS 3 Room service appropriate? Yes; Fluid consistency: Thin  Diet effective now              EDUCATION NEEDS:   Education needs have been addressed  Skin:  Skin Assessment: Reviewed RN Assessment  Last BM:  07/28/18  Height:   Ht Readings from Last 1  Encounters:  07/19/18 5\' 8"  (1.727 m)    Weight:   Wt Readings from Last 1 Encounters:  07/31/18 58.7 kg    Ideal Body Weight:  70 kg  BMI:  Body mass index is 19.68 kg/m.  Estimated Nutritional Needs:   Kcal:  2100-2300  Protein:  110-125 grams  Fluid:  > 2.1 L    Gaynell Face, MS, RD, LDN Inpatient Clinical Dietitian Pager: [phone] Weekend/After Hours: [phone]

## 2018-07-31 NOTE — Progress Notes (Signed)
   Subjective:  Ryan [name] is a 68 y.o. M with PMH of alcoholic cirrhosis, X3AT admit for weakness on hospital day 54  Ryan [name] was examined at bedside and reports that he did not get much sleep due to the cough he had been having but states his chest pain has resolved earlier this morning. He currently denies dyspnea, palpitation, cough. He does report of feeling tired.   Had prolonged conversation again with daughter, Ryan [name], again who was updated on Ryan [name]'s clinical course and current plan. She mentions that for full disclosure, she has not met him in person for years although they do speak on the phone regularly. She mentions that he appears to be not in full comprehension of his disease state and mentions appreciation for continued goals of care discussion. She states she would like to have social work assistance for possible Forensic scientist conversation.   Objective:  Vital signs in last 24 hours: Vitals:   07/30/18 1053 07/30/18 1608 07/30/18 2340 07/31/18 0322  BP: 112/84 1_0  Pulse: (!) 101 (!) 102 (!) 103 (!) 102  Resp: (!) 26 (!) 28 (!) 23 17  Temp: (!) 97.4 F (36.3 C) 97.6 F (36.4 C) 98.7 F (37.1 C)   TempSrc: Oral Oral Oral   SpO2: 97% 97% 99% 97%  Weight:    58.7 kg  Height:       Physical Exam  Constitutional: He is oriented to person, place, and time.  Chronically ill-appearing  Cardiovascular: Normal rate and intact distal pulses.  Pulmonary/Chest: Effort normal and breath sounds normal. He exhibits no tenderness.  Musculoskeletal: Normal range of motion.        General: No edema (trace pitting edema on R ankle only).  Neurological: He is alert and oriented to person, place, and time.   Assessment/Plan:  Principal Problem:   Weight loss, unintentional Active Problems:   Acute kidney injury ([name])   History of alcohol use disorder   Proteinuria   Lactic acidosis   Cholestasis   Thrombocytopenia (HCC)   Alcoholic  cirrhosis (HCC)   Hyperkalemia   Metabolic acidosis, normal anion gap (NAG)   Pleural effusion   Acute HFrEF (heart failure with reduced ejection fraction) (HCC)  Ryan [name] is a 68 yo M with a PMHxfor alcoholic cirrhosis, type 2 diabetes mellitus with admitted for weight loss and weakness 2/2 low output heart failure. He was found to be progressively hypotensive throughout the night. During our examination bp 76/48. On chart review, have not yet received Bidil or torsemide. Will hold these medications and observe for now. Appreciate heart failure assistance  A/P: Chest Pain 2/2 MSK strain from coughing EKG with new T wave inversions on V4, V5, V6. Troponin level stable at 0.23. Pain resolved with tramadol - Resolved  Low output heart failure Left ventricular thrombus: O2 sat 98 on 2L. BP 76/48. Co-ox show venous O2 sat 92.9 - repeat to confirm show 54.3 which is most likely more accurate. Put out only 675cc on lasix 40 yesterday. HR stable but elevated at 105. Milirinone taper stopped yesterday at 10am. Creatinine 2.37->2.54->2.51 this AM. INR 2.4 - Appreciate heart failure recs - Stop Bidil, hold torsemide this AM - Continue goals of care discussion - C/w Coumadin per pharmacy  DVT prophx: heparin + Coumadin Diet: DYS 3 Code: DNR  Dispo: Anticipated discharge in approximately 1-2 day(s).   [name], MD 07/31/2018, 6:18 AM Pager: [phone]

## 2018-07-31 NOTE — Progress Notes (Addendum)
Patient ID: Ryan [name], male   DOB: 01/26/1951, 68 y.o.   MRN: [id number]    Advanced Heart Failure Rounding Note   Subjective:   Yesterday milrinone was stopped. Todays CO-OX is 93%. Repeat was 54%. Bidil stopped this morning due soft SBP.   Feeling better today. Denies chest pain. No orthopnea or PND. CXR looks ok.   Objective:   Weight Range:  Vital Signs:   Temp:  [97.4 F (36.3 C)-98.7 F (37.1 C)] 98.7 F (37.1 C) (06/22 2340) Pulse Rate:  [101-103] 103 (06/23 0740) Resp:  [17-28] 20 (06/23 0740) BP: (90-114)/(67-88) 93/67 (06/23 0740) SpO2:  [94 %-99 %] 94 % (06/23 0740) Weight:  [58.7 kg] 58.7 kg (06/23 0322) Last BM Date: 07/28/18  Weight change: Filed Weights   07/29/18 0500 07/30/18 0348 07/31/18 0322  Weight: 59.5 kg 59 kg 58.7 kg    Intake/Output:   Intake/Output Summary (Last 24 hours) at 07/31/2018 0833 Last data filed at 07/30/2018 2342 Gross per 24 hour  Intake 240 ml  Output 675 ml  Net -435 ml     Physical Exam: CVP 5-6 personally checked.  General:  Thin. No resp difficulty HEENT: normal Neck: supple. no JVD. Carotids 2+ bilat; no bruits. No lymphadenopathy or thryomegaly appreciated. Cor: PMI nondisplaced. Regular rate & rhythm. No rubs, or murmurs. +S3  Lungs: clear Abdomen: soft, nontender, nondistended. No hepatosplenomegaly. No bruits or masses. Good bowel sounds. Extremities: no cyanosis, clubbing, rash, edema. RUE PICC Neuro: alert & orientedx3, cranial nerves grossly intact. moves all 4 extremities w/o difficulty. Affect pleasant   Telemetry: Sinus Tach 100- 108  Personally reviewed    Labs: Basic Metabolic Panel: Recent Labs  Lab 07/27/18 0643 07/28/18 0458 07/29/18 0602 07/30/18 0419 07/31/18 0220  NA 136 135 135 135 135  K 4.0 4.1 3.9 4.0 4.4  CL 102 101 99 101 100  CO2 26 24 26 25 23   GLUCOSE 69* 96 74 101* 96  BUN 43* 43* 39* 39* 37*  CREATININE 2.37* 2.41* 2.37* 2.54* 2.51*  CALCIUM 9.0 8.9 9.1 9.1 9.1     Liver Function Tests: No results for input(s): AST, ALT, ALKPHOS, BILITOT, PROT, ALBUMIN in the last 168 hours. No results for input(s): LIPASE, AMYLASE in the last 168 hours. No results for input(s): AMMONIA in the last 168 hours.  CBC: Recent Labs  Lab 07/27/18 0643 07/28/18 0458 07/29/18 0602 07/30/18 0419 07/31/18 0220  WBC 5.6 5.4 5.8 8.2 8.9  HGB 12.5* 12.3* 13.2 13.2 13.9  HCT 37.4* 37.2* 38.4* 38.6* 41.2  MCV 89.0 90.3 87.7 87.5 87.7  PLT 157 175 198 203 201    Cardiac Enzymes: Recent Labs  Lab 07/30/18 1230 07/30/18 1900 07/31/18 0100  TROPONINI 0.25* 0.23* 0.23*    BNP: BNP (last 3 results) No results for input(s): BNP in the last 8760 hours.  ProBNP (last 3 results) No results for input(s): PROBNP in the last 8760 hours.    Other results:  Imaging: Dg Chest Portable 1 View  Result Date: 07/30/2018 CLINICAL DATA:  Chest pain. EXAM: PORTABLE CHEST 1 VIEW COMPARISON:  Radiograph of July 26, 2018. FINDINGS: Stable cardiomegaly. Atherosclerosis of thoracic aorta is noted. No pneumothorax or pleural effusion is noted. Right-sided PICC line is unchanged in position. Both lungs are clear. The visualized skeletal structures are unremarkable. IMPRESSION: No acute cardiopulmonary abnormality seen. Aortic Atherosclerosis (ICD10-I70.0). Electronically Signed   By: Marijo Conception M.D.   On: 07/30/2018 12:50     Medications:  Scheduled Medications: . benzonatate  100 mg Oral BID  . buPROPion  150 mg Oral Daily  . chlorhexidine  15 mL Mouth Rinse BID  . feeding supplement  1 Container Oral BID BM  . folic acid  1 mg Oral Daily  . hydrocortisone   Rectal BID  . insulin aspart  0-5 Units Subcutaneous QHS  . insulin aspart  0-9 Units Subcutaneous TID WC  . insulin glargine  4 Units Subcutaneous QHS  . ivabradine  2.5 mg Oral BID WC  . mouth rinse  15 mL Mouth Rinse q12n4p  . multivitamin with minerals  1 tablet Oral Daily  . pantoprazole  40 mg Oral Daily   . ramelteon  8 mg Oral QHS  . sodium chloride flush  10-40 mL Intracatheter Q12H  . thiamine  100 mg Oral Daily  . Warfarin - Pharmacist Dosing Inpatient   Does not apply q1800    Infusions: . heparin 1,150 Units/hr (07/30/18 0348)    PRN Medications: acetaminophen **OR** acetaminophen, alum & mag hydroxide-simeth, lidocaine (PF), ondansetron **OR** ondansetron (ZOFRAN) IV, senna-docusate, sodium chloride flush, traMADol   Assessment/Plan:   1. Acute biventricular systolic HF-> cardiogenic shock - Suspect NICM due to HTN +/- ETOH (LV not overly dilated LVIDd 21mm) - Initial co-ox 45%. - Milrinone stopped 6/22. CO-OX marginal at 54%  -CVP 6. Continue torsemide 40 daily - No BB with low output.  - Increase ivabradine to 5 mg twice a day. Heart has been > 100 - No b-block or ARB with low output and renal failure - Bidil stopped this morning. Switch to hydralazine 12.5 mg tid + 15 mg imdur with parameters to hold for SBP < 85.   - Needs coronary angiography if renal function permits - doubt that will happen this admit - Unable to obtain MRI with creatinine> 1.5. - Not candidate for advanced therapies with social hx and renal failure - If fails milrinone wean will have to have SW access and see if he is candidate for home inotropes despite his past history. He seems dedicated to making changes in lifestyle  2. Acute on CKD , unknown baseline  - Creatinine stable at 2.3-2.4 seems like new baseline (was 1.65 in 9/19).  Creatinine 2.5 today.   - Renal US negative for hydronephrosis - Will need outpatient Renal f/u  3. LV Apical Thrombus  - Stop heparin drip.  - Continue warfarin  INR 2.4  - No bleeding Discussed dosing with PharmD personally.  4. RLE DVT - continue heparin/warfarin  5. Pleural Effusion -S/P R thoracentesis on 6/8 with repeat 6/18 -May need Pleurex tube if recurs.  - CXR on 6/22. No pleural effusion or pneumothorax.   5. Cirrhosis -Noted on CT this  admit   6. ETOH abuse - Drinks 2 pints of bourbon/day - On CIWA protocol per primary team   7. Cocaine Abuse  8. DM2  9. Acute Gout: Suspect gout flare right foot with warmth, elevated uric acid.  - Finished prednisone burst -management per primary team     Length of Stay: 18   Amy Clegg MD 07/31/2018, 8:33 AM  Advanced Heart Failure Team Pager [phone] (M-F; [place])  Please contact [facility] for night-coverage after hours (4p -7a ) and weekends on [email or web address]   Patient seen and examined with the above-signed Advanced Practice Provider and/or Housestaff. I personally reviewed laboratory data, imaging studies and relevant notes. I independently examined the patient and formulated the important aspects of the plan.  I have edited the note to reflect any of my changes or salient points. I have personally discussed the plan with the patient and/or family.  Improving slowing but still quite tenuous. Co-ox 54% off milrinone x 24hours. CVP ok. Will continue to watch off milrinone for at least another 24 hours. Agree with med titration as above. Continue to ambulate.  Glori Bickers, MD  10:42 AM

## 2018-07-31 NOTE — Progress Notes (Signed)
Physical Therapy Treatment Patient Details Name: Ryan [name] MRN: [id number] DOB: 23-Jun-1950 Today's Date: 07/31/2018    History of Present Illness 68 yo male w/ a PMHx notable for alcohol use disorder, diabetes, and GAD who presented with a two month history of anorexia, weight loss reported at 60lbs, malaise and intermittent abdominal pain. Found with LV thrombus and R LE DVT 6/11.     PT Comments    Patient seen for mobility progression. Pt tolerated gait distance of 70 ft with min guard/min A and use of RW. Pt on RA throughout session and vitals WNL with no SOB. Pt continues to demonstrate decreased activity tolerance and generalized weakness. Current plan remains appropriate.    Follow Up Recommendations  Home health PT     Equipment Recommendations  Rolling walker with 5" wheels    Recommendations for Other Services       Precautions / Restrictions Precautions Precautions: Fall    Mobility  Bed Mobility Overal bed mobility: Modified Independent                Transfers Overall transfer level: Needs assistance Equipment used: Rolling walker (2 wheeled) Transfers: Sit to/from Stand Sit to Stand: Min guard         General transfer comment: min guard to stand for safety; cues for safe hand placement  Ambulation/Gait Ambulation/Gait assistance: Min guard;Min assist Gait Distance (Feet): 70 Feet Assistive device: Rolling walker (2 wheeled) Gait Pattern/deviations: Step-through pattern;Decreased stride length;Narrow base of support Gait velocity: decreased   General Gait Details: assist to steady especially when turning and the last 30 ft due to increasing bilat LE weakness; pt unsteady but no LOB   Stairs             Wheelchair Mobility    Modified Rankin (Stroke Patients Only)       Balance Overall balance assessment: Needs assistance Sitting-balance support: No upper extremity supported;Feet supported Sitting balance-Leahy Scale:  Good     Standing balance support: Bilateral upper extremity supported;During functional activity;Single extremity supported Standing balance-Leahy Scale: Poor Standing balance comment: reliant on UE support in standing                             Cognition Arousal/Alertness: Awake/alert Behavior During Therapy: WFL for tasks assessed/performed Overall Cognitive Status: Within Functional Limits for tasks assessed                                        Exercises      General Comments General comments (skin integrity, edema, etc.): pt on RA; Vitals WNL and pt without SOB or DOE      Pertinent Vitals/Pain Pain Assessment: No/denies pain    Home Living                      Prior Function            PT Goals (current goals can now be found in the care plan section) Progress towards PT goals: Progressing toward goals    Frequency    Min 3X/week      PT Plan Current plan remains appropriate    Co-evaluation              AM-PAC PT "6 Clicks" Mobility   Outcome Measure  Help needed turning from your back to your side  while in a flat bed without using bedrails?: None Help needed moving from lying on your back to sitting on the side of a flat bed without using bedrails?: None Help needed moving to and from a bed to a chair (including a wheelchair)?: A Little Help needed standing up from a chair using your arms (e.g., wheelchair or bedside chair)?: A Little Help needed to walk in hospital room?: A Little Help needed climbing 3-5 steps with a railing? : A Little 6 Click Score: 20    End of Session Equipment Utilized During Treatment: Gait belt Activity Tolerance: Patient tolerated treatment well Patient left: with call bell/phone within reach;in bed Nurse Communication: Mobility status PT Visit Diagnosis: Unsteadiness on feet (R26.81);Other abnormalities of gait and mobility (R26.89)     Time: 2023-3435 PT Time  Calculation (min) (ACUTE ONLY): 24 min  Charges:  $Gait Training: 23-37 mins                     Earney Navy, PTA Acute Rehabilitation Services Pager: [phone] Office: [phone]     Darliss Cheney 07/31/2018, 4:54 PM

## 2018-07-31 NOTE — Plan of Care (Signed)

## 2018-07-31 NOTE — Progress Notes (Signed)
ANTICOAGULATION CONSULT NOTE - Follow Up Consult  Pharmacy Consult for Warfarin Indication: LV thrombus and DVT   Labs: Recent Labs    07/29/18 0602 07/29/18 1507 07/30/18 0419 07/30/18 1230 07/30/18 1900 07/31/18 0100 07/31/18 0220 07/31/18 0227  HGB 13.2  --  13.2  --   --   --  13.9  --   HCT 38.4*  --  38.6*  --   --   --  41.2  --   PLT 198  --  203  --   --   --  201  --   LABPROT 15.9*  --  20.2*  --   --   --  25.5*  --   INR 1.3*  --  1.8*  --   --   --  2.4*  --   HEPARINUNFRC  --  0.71* 0.61  --   --   --   --  0.66  CREATININE 2.37*  --  2.54*  --   --   --  2.51*  --   TROPONINI  --   --   --  0.25* 0.23* 0.23*  --   --     Assessment: 68 yo male currently therapeutic on warfarin for LV thrombus and DVT.   Warfarin currently at 5mg  daily. Hgb 13.9. PLT 201. No signs/symptoms of bleeding per notes.  INR 2.4 currently at goal, but shows large increase from 1.8 yesterday. Warfarin started on 6/17. Baseline INR (6/6) elevated at 1.7.  Heparin drip stopped this AM due to therapeutic INR.   Goal of Therapy:  INR 2-3   Plan:  Continue warfarin 5 mg once tonight.  Recheck INR with AM labs.  Onnie Boer; PharmD Candidate 07/31/2018 9:38 AM

## 2018-08-01 ENCOUNTER — Telehealth: Payer: Self-pay

## 2018-08-01 DIAGNOSIS — Z7984 Long term (current) use of oral hypoglycemic drugs: Secondary | ICD-10-CM

## 2018-08-01 DIAGNOSIS — Z888 Allergy status to other drugs, medicaments and biological substances status: Secondary | ICD-10-CM

## 2018-08-01 LAB — BASIC METABOLIC PANEL
Anion gap: 12 (ref 5–15)
BUN: 43 mg/dL — ABNORMAL HIGH (ref 8–23)
CO2: 23 mmol/L (ref 22–32)
Calcium: 9.3 mg/dL (ref 8.9–10.3)
Chloride: 100 mmol/L (ref 98–111)
Creatinine, Ser: 2.62 mg/dL — ABNORMAL HIGH (ref 0.61–1.24)
GFR calc Af Amer: 28 mL/min — ABNORMAL LOW (ref >60)
GFR calc non Af Amer: 24 mL/min — ABNORMAL LOW (ref >60)
Glucose, Bld: 153 mg/dL — ABNORMAL HIGH (ref 70–99)
Potassium: 4.2 mmol/L (ref 3.5–5.1)
Sodium: 135 mmol/L (ref 135–145)

## 2018-08-01 LAB — COOXEMETRY PANEL
Carboxyhemoglobin: 1.4 % (ref 0.5–1.5)
Methemoglobin: 0.9 % (ref 0.0–1.5)
O2 Saturation: 76.1 %
Total hemoglobin: 14.7 g/dL (ref 12.0–16.0)

## 2018-08-01 LAB — PROTIME-INR
INR: 2.6 — ABNORMAL HIGH (ref 0.8–1.2)
Prothrombin Time: 27.8 seconds — ABNORMAL HIGH (ref 11.4–15.2)

## 2018-08-01 LAB — GLUCOSE, CAPILLARY
Glucose-Capillary: 152 mg/dL — ABNORMAL HIGH (ref 70–99)
Glucose-Capillary: 156 mg/dL — ABNORMAL HIGH (ref 70–99)

## 2018-08-01 MED ORDER — IVABRADINE HCL 5 MG PO TABS
5.0000 mg | ORAL_TABLET | Freq: Two times a day (BID) | ORAL | Status: DC
  Administered 2018-08-01: 5 mg via ORAL
  Filled 2018-08-01 (×2): qty 1

## 2018-08-01 MED ORDER — METFORMIN HCL 500 MG PO TABS: 500.0000 mg | ORAL_TABLET | Freq: Two times a day (BID) | ORAL | 0 refills | Status: AC

## 2018-08-01 MED ORDER — ISOSORBIDE MONONITRATE ER 30 MG PO TB24: 15.0000 mg | ORAL_TABLET | Freq: Every day | ORAL | 0 refills | Status: AC

## 2018-08-01 MED ORDER — HYDRALAZINE HCL 25 MG PO TABS: 12.5000 mg | ORAL_TABLET | Freq: Two times a day (BID) | ORAL | 0 refills | Status: AC

## 2018-08-01 MED ORDER — WARFARIN SODIUM 5 MG PO TABS: 5.0000 mg | ORAL_TABLET | Freq: Every day | ORAL | 0 refills | Status: AC

## 2018-08-01 MED ORDER — IVABRADINE HCL 7.5 MG PO TABS: 7.5000 mg | ORAL_TABLET | Freq: Two times a day (BID) | ORAL | Status: DC

## 2018-08-01 MED ORDER — WARFARIN SODIUM 5 MG PO TABS
5.0000 mg | ORAL_TABLET | Freq: Once | ORAL | Status: AC
  Administered 2018-08-01: 18:00:00 5 mg via ORAL
  Filled 2018-08-01: qty 1

## 2018-08-01 MED ORDER — IVABRADINE HCL 5 MG PO TABS: 5.0000 mg | ORAL_TABLET | Freq: Two times a day (BID) | ORAL | 0 refills | Status: AC

## 2018-08-01 MED ORDER — BUPROPION HCL ER (XL) 150 MG PO TB24: 150.0000 mg | ORAL_TABLET | Freq: Every day | ORAL | 0 refills | Status: AC

## 2018-08-01 MED ORDER — TORSEMIDE 20 MG PO TABS: 40.0000 mg | ORAL_TABLET | Freq: Every day | ORAL | 0 refills | Status: DC

## 2018-08-01 MED FILL — buPROPion HCL ER (XL) 150 M: 150 | 30 days supply | Qty: 30 | Fill #0

## 2018-08-01 MED FILL — ISOSORBIDE MN ER 30 MG TAB: 30 | 30 days supply | Qty: 15 | Fill #0

## 2018-08-01 MED FILL — hydrALAZINE HCL 25 MG TABS: 25 | 30 days supply | Qty: 30 | Fill #0

## 2018-08-01 MED FILL — WARFARIN SODIUM 5 MG TABLET: 5 | 30 days supply | Qty: 30 | Fill #0

## 2018-08-01 MED FILL — TORSEMIDE 20 MG TABLET: 20 | 30 days supply | Qty: 60 | Fill #0

## 2018-08-01 MED FILL — metFORMIN HCL 500 MG TABS: 500 | 30 days supply | Qty: 60 | Fill #0

## 2018-08-01 NOTE — TOC Transition Note (Signed)
Transition of Care [facility]) - CM/SW Discharge Note   Patient Details  Name: Ryan [name] MRN: [id number] Date of Birth: May 30, 1950  Transition of Care [facility] CM/SW Contact:  Pollie Friar, RN Phone Number: 08/01/2018, 12:56 PM   Clinical Narrative:    Pt discharging home with [facility] aware of d/c today.  Pt with orders for DME. Pt agreeable to TOC but when delivered he refused the equipment. Pt without drug coverage under his medicare. TOC updated MD d/t [place]lan[place] costing $450.00 and pt can not afford. Dr Truman Hayward to talk with CHF MD about assisting with this medications. TOC able to assist with co pays for other medications. Pt states he has transportation home and someone to check in on him at home.   Final next level of care: [place] Barriers to Discharge: Inadequate or no insurance(no drug coverage for his medications)   Patient Goals and CMS Choice Patient states their goals for this hospitalization and ongoing recovery are:: "to feel better" CMS Medicare.gov Compare Post Acute Care list provided to:: Patient Choice offered to / list presented to : Patient  Discharge Placement                       Discharge Plan and Services In-house Referral: NA Discharge Planning Services: CM Consult Post Acute Care Choice: Home Health          DME Arranged: [place] rolling, 3-N-1(pt agreed to receiving these with TOC and then refused when delivered) DME Agency: NA       HH Arranged: PT, OT, RN [place] Agency: [facility] Date [facility] Agency Contacted: 08/01/18 Time [place]: [id number] Representative spoke with at [place]: [place] ([place]) Interventions     Readmission Risk Interventions No flowsheet data found.

## 2018-08-01 NOTE — Care Management Important Message (Signed)
Important Message  Patient Details  Name: Ryan [name] MRN: [id number] Date of Birth: September 10, 1950   Medicare Important Message Given:  Yes     [name] 08/01/2018, 1:34 PM

## 2018-08-01 NOTE — Progress Notes (Addendum)
ANTICOAGULATION CONSULT NOTE - Follow Up Consult  Pharmacy Consult for Warfarin Indication:  LV thrombus and DVT    Labs: Recent Labs    07/29/18 1507 07/30/18 0419 07/30/18 1230 07/30/18 1900 07/31/18 0100 07/31/18 0220 07/31/18 0227 08/01/18 0250  HGB  --  13.2  --   --   --  13.9  --   --   HCT  --  38.6*  --   --   --  41.2  --   --   PLT  --  203  --   --   --  201  --   --   LABPROT  --  20.2*  --   --   --  25.5*  --  27.8*  INR  --  1.8*  --   --   --  2.4*  --  2.6*  HEPARINUNFRC 0.71* 0.61  --   --   --   --  0.66  --   CREATININE  --  2.54*  --   --   --  2.51*  --  2.62*  TROPONINI  --   --  0.25* 0.23* 0.23*  --   --   --     Assessment: 68 yo male currently therapeutic on warfarin for LV thrombus and DVT.   Warfarin currently at 5mg  daily. Hgb 13.9. PLT 201 on 6/24. No signs/symptoms of bleeding per notes.  INR 2.6 currently at goal, up from 2.4 yesterday. Warfarin started on 6/17. Baseline INR (6/6) elevated at 1.7.   Goal of Therapy:  INR 2-3   Plan:  Continue warfarin 5 mg once a day at home.   Onnie Boer; PharmD Candidate 08/01/2018 10:26 AM

## 2018-08-01 NOTE — Progress Notes (Signed)
Occupational Therapy Treatment Patient Details Name: Ryan [name] MRN: [id number] DOB: 02-02-1951 Today's Date: 08/01/2018    History of present illness 68 yo male w/ a PMHx notable for alcohol use disorder, diabetes, and GAD who presented with a two month history of anorexia, weight loss reported at 60lbs, malaise and intermittent abdominal pain. Found with LV thrombus and R LE DVT 6/11.    OT comments  Reinforced energy conservation strategies. Pt performing toileting and one activity at sink with supervision for safety/IV pole. Pt with flat affect throughout session. Reports he has assistance for getting groceries, medications when he goes home.   Follow Up Recommendations  Home health OT;Supervision/Assistance - 24 hour    Equipment Recommendations  3 in 1 bedside commode    Recommendations for Other Services      Precautions / Restrictions Precautions Precautions: Fall       Mobility Bed Mobility               General bed mobility comments: pt seated in chair  Transfers Overall transfer level: Needs assistance Equipment used: Rolling walker (2 wheeled) Transfers: Sit to/from Stand Sit to Stand: Supervision         General transfer comment: supervision for safety, line    Balance Overall balance assessment: Needs assistance   Sitting balance-Leahy Scale: Good       Standing balance-Leahy Scale: Fair Standing balance comment: statically in standing                           ADL either performed or assessed with clinical judgement   ADL Overall ADL's : Needs assistance/impaired     Grooming: Wash/dry hands;Standing;Supervision/safety           Upper Body Dressing : Set up;Sitting   Lower Body Dressing: Supervision/safety;Sit to/from stand   Toilet Transfer: Supervision/safety;Ambulation   Toileting- Clothing Manipulation and Hygiene: Supervision/safety;Sit to/from stand       Functional mobility during ADLs:  Supervision/safety General ADL Comments: reinforced energy conservation strategies including sitting at sink for shaving and keeping door open and exhaust fan on as he sits to shower minimizing hot water     Vision       Perception     Praxis      Cognition Arousal/Alertness: Awake/alert Behavior During Therapy: Flat affect Overall Cognitive Status: Within Functional Limits for tasks assessed                                 General Comments: pt acknowledging understanding of energy conservation strategies from previous session        Exercises     Shoulder Instructions       General Comments      Pertinent Vitals/ Pain       Pain Assessment: No/denies pain  Home Living                                          Prior Functioning/Environment              Frequency  Min 2X/week        Progress Toward Goals  OT Goals(current goals can now be found in the care plan section)  Progress towards OT goals: Progressing toward goals  Acute Rehab OT Goals Patient Stated Goal: to go  home OT Goal Formulation: With patient Time For Goal Achievement: 07/28/18 Potential to Achieve Goals: Good  Plan Discharge plan remains appropriate;Frequency remains appropriate    Co-evaluation                 AM-PAC OT "6 Clicks" Daily Activity     Outcome Measure   Help from another person eating meals?: None Help from another person taking care of personal grooming?: A Little Help from another person toileting, which includes using toliet, bedpan, or urinal?: A Little Help from another person bathing (including washing, rinsing, drying)?: A Little Help from another person to put on and taking off regular upper body clothing?: None Help from another person to put on and taking off regular lower body clothing?: A Little 6 Click Score: 20    End of Session    OT Visit Diagnosis: Unsteadiness on feet (R26.81);Muscle weakness  (generalized) (M62.81);Dizziness and giddiness (R42)   Activity Tolerance Patient tolerated treatment well   Patient Left in chair;with call bell/phone within reach;with nursing/sitter in room   Nurse Communication          Time: 1003-4961 OT Time Calculation (min): 13 min  Charges: OT General Charges $OT Visit: 1 Visit OT Treatments $Self Care/Home Management : 8-22 mins  Nestor Lewandowsky, OTR/L Acute Rehabilitation Services Pager: [phone] Office: [phone]   Malka So 08/01/2018, 11:00 AM

## 2018-08-01 NOTE — Progress Notes (Signed)
   Subjective:  Ryan [name] is a 68 y.o. M with PMH of D9RC, Alcoholic cirrhosis admit for low output heart failure on hospital day 72  Ryan [name] was examined and evaluated at bedside this am. He states his chest pain has not re-occurred and he feels well. He states he is looking forward to being discharged home and discussed that he plans to move to [place] to be with his daughter. He mentions that most likely it will require couple weeks to get everything 'sorted out.' Discussed short term follow up with HF clinic and [facility] clinic here before he moves and making sure his medication is optimized. Also discussed importance of avoiding high salt intake. Ryan [name] expressed understanding.  Objective:  Vital signs in last 24 hours: Vitals:   07/31/18 1800 07/31/18 2021 08/01/18 0002 08/01/18 0247  BP:  100/75 105/76   Pulse:  (!) 106 99   Resp: (!) 25 (!) 21 19   Temp:  (!) 96.9 F (36.1 C)    TempSrc:  Oral    SpO2:  97% 98%   Weight:    58.2 kg  Height:       Gen: Chronically ill-appearing Pulm: CTAB, No rales, no wheezes Extm: ROM intact, Peripheral pulses intact, No peripheral edema Skin: Dry, Warm  Assessment/Plan:  Principal Problem:   Cardiogenic shock (HCC) Active Problems:   Acute kidney injury (HCC)   Alcoholic cirrhosis (HCC)   Pleural effusion   LV (left ventricular) mural thrombus without MI   Right leg DVT (HCC)   Protein-calorie malnutrition, severe  Ryan Nicholsonis a 68 yo M with a PMHxfor alcoholic cirrhosis, type 2 diabetes mellitus with admitted for weight loss and weakness2/2 low output heart failure. He is intermittently hypotensive but at the point without further alternative options. Not candidate for aggressive therapy per Heart Failure. Currently stable respiratory status on soft blood pressures requesting discharge. Will be discharged home with close follow up.  Low output heart failure Left ventricular thrombus: O2sat 98 on 2L. BP  89/66. Co-ox show venous O2 sat 76.1 this am.  HR stable but elevated at 100.Off milrinone for 48 hours. Creatinine 2.37->2.54->2.51->2.62 this AM. INR 2.6 - Appreciate heart failure recs: Discharge today on torsemide, imdur, ivabradine, hydralazine - C/w Coumadin - Will need close f/u with Heart Failure clinic as well as [facility]  DVT prophx:Coumadin Diet:DYS 3 Code:DNR  Dispo: Anticipated discharge in approximately today.   [name], MD 08/01/2018, 6:45 AM Pager: [phone]

## 2018-08-01 NOTE — Progress Notes (Signed)
Pt provided with verbal discharge instruction. Paper copy provided to patient. Patient had no further questions. PICC removed per orders. IV removed per order. Prescriptions brought to beside [facility] pharmacy.  Patient belongings sent with patient in patient belongings bag. Patient discharge via wheelchair through emergency entrance to private vehicle.

## 2018-08-01 NOTE — Progress Notes (Addendum)
Patient ID: Ryan [name], male   DOB: [birth year], 68 y.o.   MRN: [id number]    Advanced Heart Failure Rounding Note   Subjective:    Yesterday hydralazine 12.5 mg tid and imdur started. Morning dose of hydralazine held.   Denies SOB. Complaining of constipation.    Objective:   Weight Range:  Vital Signs:   Temp:  [96.9 F (36.1 C)-97.9 F (36.6 C)] 97.9 F (36.6 C) (06/24 0729) Pulse Rate:  [99-106] 101 (06/24 0729) Resp:  [18-29] 29 (06/24 0719) BP: (76-106)/(48-79) 89/66 (06/24 0729) SpO2:  [97 %-98 %] 98 % (06/24 0729) Weight:  [58.2 kg] 58.2 kg (06/24 0247) Last BM Date: 07/28/18  Weight change: Filed Weights   07/30/18 0348 07/31/18 0322 08/01/18 0247  Weight: 59 kg 58.7 kg 58.2 kg    Intake/Output:   Intake/Output Summary (Last 24 hours) at 08/01/2018 0747 Last data filed at 07/31/2018 2151 Gross per 24 hour  Intake 240 ml  Output 200 ml  Net 40 ml     Physical Exam: CVP 6-7  General:  Well appearing. No resp difficulty HEENT: normal Neck: supple. no JVD. Carotids 2+ bilat; no bruits. No lymphadenopathy or thryomegaly appreciated. Cor: PMI nondisplaced. Regular rate & rhythm. No rubs, or murmurs. +S3  Lungs: clear Abdomen: soft, nontender, nondistended. No hepatosplenomegaly. No bruits or masses. Good bowel sounds. Extremities: no cyanosis, clubbing, rash, edema. RUE PICC Neuro: alert & orientedx3, cranial nerves grossly intact. moves all 4 extremities w/o difficulty. Affect pleasant   Telemetry: ST 100s    Labs: Basic Metabolic Panel: Recent Labs  Lab 07/28/18 0458 07/29/18 0602 07/30/18 0419 07/31/18 0220 08/01/18 0250  NA 135 135 135 135 135  K 4.1 3.9 4.0 4.4 4.2  CL 101 99 101 100 100  CO2 24 26 25 23 23   GLUCOSE 96 74 101* 96 153*  BUN 43* 39* 39* 37* 43*  CREATININE 2.41* 2.37* 2.54* 2.51* 2.62*  CALCIUM 8.9 9.1 9.1 9.1 9.3    Liver Function Tests: No results for input(s): AST, ALT, ALKPHOS, BILITOT, PROT, ALBUMIN in the  last 168 hours. No results for input(s): LIPASE, AMYLASE in the last 168 hours. No results for input(s): AMMONIA in the last 168 hours.  CBC: Recent Labs  Lab 07/27/18 0643 07/28/18 0458 07/29/18 0602 07/30/18 0419 07/31/18 0220  WBC 5.6 5.4 5.8 8.2 8.9  HGB 12.5* 12.3* 13.2 13.2 13.9  HCT 37.4* 37.2* 38.4* 38.6* 41.2  MCV 89.0 90.3 87.7 87.5 87.7  PLT 157 175 198 203 201    Cardiac Enzymes: Recent Labs  Lab 07/30/18 1230 07/30/18 1900 07/31/18 0100  TROPONINI 0.25* 0.23* 0.23*    BNP: BNP (last 3 results) No results for input(s): BNP in the last 8760 hours.  ProBNP (last 3 results) No results for input(s): PROBNP in the last 8760 hours.    Other results:  Imaging: Dg Chest Portable 1 View  Result Date: 07/30/2018 CLINICAL DATA:  Chest pain. EXAM: PORTABLE CHEST 1 VIEW COMPARISON:  Radiograph of July 26, 2018. FINDINGS: Stable cardiomegaly. Atherosclerosis of thoracic aorta is noted. No pneumothorax or pleural effusion is noted. Right-sided PICC line is unchanged in position. Both lungs are clear. The visualized skeletal structures are unremarkable. IMPRESSION: No acute cardiopulmonary abnormality seen. Aortic Atherosclerosis (ICD10-I70.0). Electronically Signed   By: Marijo Conception M.D.   On: 07/30/2018 12:50     Medications:     Scheduled Medications: . benzonatate  100 mg Oral BID  . buPROPion  150 mg  Oral Daily  . chlorhexidine  15 mL Mouth Rinse BID  . feeding supplement (ENSURE ENLIVE)  237 mL Oral TID BM  . folic acid  1 mg Oral Daily  . hydrALAZINE  12.5 mg Oral Q8H  . hydrocortisone   Rectal BID  . insulin aspart  0-5 Units Subcutaneous QHS  . insulin aspart  0-9 Units Subcutaneous TID WC  . insulin glargine  4 Units Subcutaneous QHS  . isosorbide mononitrate  15 mg Oral Daily  . ivabradine  5 mg Oral BID WC  . mouth rinse  15 mL Mouth Rinse q12n4p  . multivitamin with minerals  1 tablet Oral Daily  . pantoprazole  40 mg Oral Daily  .  ramelteon  8 mg Oral QHS  . sodium chloride flush  10-40 mL Intracatheter Q12H  . thiamine  100 mg Oral Daily  . torsemide  40 mg Oral Daily  . Warfarin - Pharmacist Dosing Inpatient   Does not apply q1800    Infusions:   PRN Medications: acetaminophen **OR** acetaminophen, alum & mag hydroxide-simeth, lidocaine (PF), ondansetron **OR** ondansetron (ZOFRAN) IV, senna-docusate, sodium chloride flush, traMADol   Assessment/Plan:   1. Acute biventricular systolic HF-> cardiogenic shock - Suspect NICM due to HTN +/- ETOH (LV not overly dilated LVIDd 57mm) - Initial co-ox 45%. - Milrinone stopped 6/22.  -CO-OX stable 76%. CVP 6-7 Volume status stable. Continue torsemide 40 daily.  - No BB with low output.  - Increase ivabradine to 7.5 mg twice a day. Heart has been > 100 - No b-block or ARB with low output and renal failure - Hold hydralazine 12.5 mg tid + 15 mg imdur for now. Can re-challenge as an outpatient.  - Needs coronary angiography if renal function permits - doubt that will happen this admit - Unable to obtain MRI with creatinine> 1.5. - Not candidate for advanced therapies with social hx and renal failure - If fails milrinone wean will have to have SW access and see if he is candidate for home inotropes despite his past history. He seems dedicated to making changes in lifestyle  2. Acute on CKD , unknown baseline  - Creatinine stable at 2.3-2.4 seems like new baseline (was 1.65 in 9/19).  Creatinine 2.5 >2.6   - Renal US negative for hydronephrosis - Will need outpatient Renal f/u  3. LV Apical Thrombus  - Continue warfarin  INR 2.6   - No bleeding Discussed dosing with PharmD personally.  4. RLE DVT - continue heparin/warfarin  5. Pleural Effusion -S/P R thoracentesis on 6/8 with repeat 6/18 -May need Pleurex tube if recurs.  - CXR on 6/22. No pleural effusion or pneumothorax.   5. Cirrhosis -Noted on CT this admit   6. ETOH abuse - Drinks 2 pints of  bourbon/day - On CIWA protocol per primary team   7. Cocaine Abuse  8. DM2  9. Acute Gout: Suspect gout flare right foot with warmth, elevated uric acid.  - Finished prednisone burst -management per primary team   We will set up f/u in the HF clinic next week.   Length of Stay: [place] NP-C  08/01/2018, 7:47 AM  Advanced Heart Failure Team Pager [phone] (M-F; 7a - 4p)  Please contact [facility] for night-coverage after hours (4p -7a ) and weekends on [email or web address]   Patient seen and examined with the above-signed Advanced Practice Provider and/or Housestaff. I personally reviewed laboratory data, imaging studies and relevant notes. I independently examined the  patient and formulated the important aspects of the plan. I have edited the note to reflect any of my changes or salient points. I have personally discussed the plan with the patient and/or family.  He is stable today. I think this is about as good as we can get him. I am quite worried about his prognosis. Unfortunately not candidate for advanced therapies.   Can go home today with close f/u with [place] here in HF Clinic (or he may be planning to move to [place] with his sister). BP low on hydral/Imdur but I think he needs some therapy.   Will try to d/c home on  Torsemide 40 daily  Hydralazine 12.5 bid only Imdur 15 daily at bedtime Ivabradine 5 bid  We will sign off.   Glori Bickers, MD  9:26 AM

## 2018-08-01 NOTE — Telephone Encounter (Signed)
Hospital TOC per Dr. Truman Hayward, discharge 08/01/2018, appt 08/08/2018.

## 2018-08-03 NOTE — Telephone Encounter (Signed)
Called to complete TOC call, went straight to vmail, lm for rtc

## 2018-08-06 ENCOUNTER — Telehealth: Payer: Self-pay | Admitting: *Deleted

## 2018-08-06 NOTE — Telephone Encounter (Signed)
Ryan [name] with [facility] called to let us know they have not been able to reach patient to start services for [facility] PT/OT and RN. If patient returns TOC call, please let him know to call Dorian Pod at [phone]. Dorian Pod will also drive by his home to see if she can reach him that way. Hubbard Hartshorn, RN, BSN

## 2018-08-08 ENCOUNTER — Encounter: Payer: Self-pay | Admitting: Internal Medicine

## 2018-08-08 ENCOUNTER — Other Ambulatory Visit: Payer: Self-pay

## 2018-08-08 ENCOUNTER — Telehealth (HOSPITAL_COMMUNITY): Payer: Self-pay | Admitting: Adult Health

## 2018-08-08 ENCOUNTER — Ambulatory Visit (INDEPENDENT_AMBULATORY_CARE_PROVIDER_SITE_OTHER): Payer: Medicare Other | Admitting: Internal Medicine

## 2018-08-08 VITALS — BP 119/89 | HR 122 | Temp 98.0°F | Wt 123.8 lb

## 2018-08-08 DIAGNOSIS — E1122 Type 2 diabetes mellitus with diabetic chronic kidney disease: Secondary | ICD-10-CM

## 2018-08-08 DIAGNOSIS — R634 Abnormal weight loss: Secondary | ICD-10-CM

## 2018-08-08 DIAGNOSIS — Z7901 Long term (current) use of anticoagulants: Secondary | ICD-10-CM | POA: Diagnosis not present

## 2018-08-08 DIAGNOSIS — I513 Intracardiac thrombosis, not elsewhere classified: Secondary | ICD-10-CM | POA: Diagnosis not present

## 2018-08-08 DIAGNOSIS — Z7984 Long term (current) use of oral hypoglycemic drugs: Secondary | ICD-10-CM

## 2018-08-08 DIAGNOSIS — D696 Thrombocytopenia, unspecified: Secondary | ICD-10-CM | POA: Diagnosis not present

## 2018-08-08 DIAGNOSIS — N179 Acute kidney failure, unspecified: Secondary | ICD-10-CM

## 2018-08-08 DIAGNOSIS — I82401 Acute embolism and thrombosis of unspecified deep veins of right lower extremity: Secondary | ICD-10-CM

## 2018-08-08 DIAGNOSIS — N189 Chronic kidney disease, unspecified: Secondary | ICD-10-CM | POA: Diagnosis not present

## 2018-08-08 DIAGNOSIS — I5082 Biventricular heart failure: Secondary | ICD-10-CM | POA: Insufficient documentation

## 2018-08-08 DIAGNOSIS — Z87891 Personal history of nicotine dependence: Secondary | ICD-10-CM | POA: Diagnosis not present

## 2018-08-08 DIAGNOSIS — Z7289 Other problems related to lifestyle: Secondary | ICD-10-CM

## 2018-08-08 DIAGNOSIS — K7030 Alcoholic cirrhosis of liver without ascites: Secondary | ICD-10-CM | POA: Diagnosis not present

## 2018-08-08 DIAGNOSIS — Z79899 Other long term (current) drug therapy: Secondary | ICD-10-CM

## 2018-08-08 DIAGNOSIS — R630 Anorexia: Secondary | ICD-10-CM

## 2018-08-08 DIAGNOSIS — Z681 Body mass index (BMI) 19 or less, adult: Secondary | ICD-10-CM

## 2018-08-08 DIAGNOSIS — I240 Acute coronary thrombosis not resulting in myocardial infarction: Secondary | ICD-10-CM

## 2018-08-08 DIAGNOSIS — E119 Type 2 diabetes mellitus without complications: Secondary | ICD-10-CM | POA: Insufficient documentation

## 2018-08-08 LAB — BASIC METABOLIC PANEL
Anion gap: 14 (ref 5–15)
BUN: 52 mg/dL — ABNORMAL HIGH (ref 8–23)
CO2: 21 mmol/L — ABNORMAL LOW (ref 22–32)
Calcium: 9.7 mg/dL (ref 8.9–10.3)
Chloride: 102 mmol/L (ref 98–111)
Creatinine, Ser: 3.05 mg/dL — ABNORMAL HIGH (ref 0.61–1.24)
GFR calc Af Amer: 23 mL/min — ABNORMAL LOW (ref >60)
GFR calc non Af Amer: 20 mL/min — ABNORMAL LOW (ref >60)
Glucose, Bld: 175 mg/dL — ABNORMAL HIGH (ref 70–99)
Potassium: 4.6 mmol/L (ref 3.5–5.1)
Sodium: 137 mmol/L (ref 135–145)

## 2018-08-08 LAB — CBC
HCT: 50.3 % (ref 39.0–52.0)
Hemoglobin: 16.3 g/dL (ref 13.0–17.0)
MCH: 29.9 pg (ref 26.0–34.0)
MCHC: 32.4 g/dL (ref 30.0–36.0)
MCV: 92.1 fL (ref 80.0–100.0)
Platelets: 209 10*3/uL (ref 150–400)
RBC: 5.46 MIL/uL (ref 4.22–5.81)
RDW: 14.8 % (ref 11.5–15.5)
WBC: 6.3 10*3/uL (ref 4.0–10.5)
nRBC: 0 % (ref 0.0–0.2)

## 2018-08-08 LAB — GLUCOSE, CAPILLARY: Glucose-Capillary: 175 mg/dL — ABNORMAL HIGH (ref 70–99)

## 2018-08-08 LAB — POCT INR: INR: 2.1 (ref 2.0–3.0)

## 2018-08-08 NOTE — Progress Notes (Signed)
   CC: Hospital follow-up, Anorexia, SOB    HPI:  [name] is a 68 y.o. male with a PMHx of alcoholic cirrhosis, alcohol use disorder and type 2 diabetes who presents to the clinic for a hospital follow up, in addition to acute complaints of anorexia and SOB. He states anorexia has been present since discharge and is mainly due to a metallic taste in his mouth and lack of appetite. He has tried to eat soft foods such as oatmeal but has not eaten in the past 2 days. He is able to tolerate small amounts of water. He endorses generalized weakness, but denies nausea, vomiting, diarrhea, abdominal pain. [name] reports SOB began 1-2 days ago and is similar to the SOB he experienced when admitted to the hospital. It is worsened by laying down. He denies chest pain, new cough, runny nose, fever, chills. Endorses lower extremity leg swelling.  See Problem list for details of hospitalization.   Past Medical History:  Diagnosis Date  . Cardiogenic shock ([place]) 07/19/2018  . History of alcohol use disorder   . Pleural effusion 07/14/2018   Review of Systems:  Negative except as noted in the HPI.   Physical Exam:  Vitals:   08/08/18 1407  BP: 119/89  Pulse: (!) 122  Temp: 98 F (36.7 C)  TempSrc: Oral  SpO2: 98%  Weight: 123 lb 12.8 oz (56.2 kg)    Constitutional: Alert and in no acute distress. Patient appears cachetic.  HEENT: Oropharynx clear without erythema or exudate. Good dentition.  Respiratory: Decreased breathe sounds. Clear to auscultation bilaterally.  Cardiac: Tachycardiac but with normal rhythm. No murmurs, rubs or gallops. No lower extremity edema. No JVD.  Abdominal: Soft with no tenderness to palpation.  Extremities: No lower extremity tenderness.  Skin: Warm and dry.    Assessment & Plan:   See Encounters Tab for problem based charting.   Patient seen with Dr. Dareen Piano

## 2018-08-08 NOTE — Telephone Encounter (Signed)
Left v/m for PT BE:MLJQG screening.  -[facility]

## 2018-08-08 NOTE — Assessment & Plan Note (Signed)
Weight Loss, Unintentional: Ryan [name] is reporting significant anorexia due to lack of appetite and a metallic taste in his mouth that makes it difficult to tolerate food or liquid. He is able to drink small amounts of water at a time though. He feels that the new medications started in the hospital may be contributing to this. He sees his cardiologist tomorrow and medications may be adjusted. For now, he was instructed to hold torsemide to avoid additional dehydration.   Plan:  - Continue to monitor - Encouraged to start eating small bits at a time - Encouraged to call clinic if no improvement in appetite.

## 2018-08-08 NOTE — Assessment & Plan Note (Signed)
Thrombocytopenia:  Ryan [name] was found to have thrombocytopenia during his hospitalization on July 13, 2018.   Today, a CBC was ordered for further evaluation. Results showed platelet count of 209.   Plan:  - No further steps required.

## 2018-08-08 NOTE — Telephone Encounter (Signed)
Attempted TOC call again, rang til it cutoff

## 2018-08-08 NOTE — Assessment & Plan Note (Deleted)
[name]

## 2018-08-08 NOTE — Assessment & Plan Note (Signed)
Biventricular Heart Failure with low output:  After being admitted to the hospital on July 13, 2018, Ryan [name] had a TTE (07/18/18) for evaluation of lower extremity edema. Results showed a EF of 15-20% with bivent systolic dysfunction and severely dilated atria, bilaterally. During hospitalization, Ryan [name] required Milironone drip with Hydralazine and Lasix. He was titrated down with the addition of Ivabradine and Hydralazine. He was deemed not a candidate for advanced therapies or additional diagnostic tests due to acute renal failure. He was discharged home with Torsemide 40mg , Ivabradine 5mg , Hydralazine 12.5mg , and Imdur 15mg .  Ryan [name] states he has been taking all of his medication as prescribed and is tolerating it well. However, he does feel that the medications are contributing to the metallic taste. He has an appointment with his cardiologist tomorrow, July 2nd 2020. Due to anorexia for the past couple days, patient was instructed to hold Torsemide until his appointment tomorrow.   Plan:  - Continue taking Ivabradine, Hydralazine, and Imdur as prescribed - Hold Torsemide until appointment tomorrow w/ cardiologist.  - Follow up in 1 week

## 2018-08-08 NOTE — Assessment & Plan Note (Signed)
LV Mural thrombus: TTE on June 10th, 2020 during hospitalization discovered a left ventricular apical thrombus. Ryan [name] was started on IV Heparin and transitioned to Warfarin. Recommendation of 3 mo treatment with Warfarin.   Today, Ryan [name] is tolerating his Warfarin. INR was ordered and patient will be connected to the INR clinic here.   Plan:  - Continue to monitor INR.

## 2018-08-08 NOTE — Assessment & Plan Note (Addendum)
Acute Kidney Injury on Chronic Kidney Disease:  On admission to the hospital on July 13, 2018, BUN was 54, Creatinine of 2.36, and GFR of 31%. UA showed proteinuria. Renal U/S only showed small cyst, no pathology[facility] and baseline creatinine is 1.6. Renal function worsened slightly during hospitalization but returned to BUN 43, Creatinine 2.6, GFR 28%. Patient was found not to be a candidate for dialysis. At one point, palliative care was considered.   Today, patient notes he has not been eating for a few days and has only been tolerating small amounts of water due to a metallic taste in his mouth and lack of appetite. This has caused him to begin feeling generalized weakness. For re-evaluation, a BMP was ordered. Patient was encouraged to increase water intake and to try drinks with electrolytes. For now, Ryan [name] was instructed to hold Torsemide to avoid further dehydration till he sees his cardiologist tomorrow.   BMP results demonstrate a worsening in renal function. His creatinine increased to 3.05, BUN to 53 and GFR decreased to 23. Due to large jump, will consider holding Torsemide for a longer period of time. Patient's dehydration is likely contributing as well.   Plan:  - Hold Torsemide till appointment tomorrow. Considering holding for a longer due to jump in creatinine.  - BMP ordered and resulted. - If no improvement in intake, patient may need to be re-admitted for stabilization and re-evaluation.  - He was asked to follow up in 1 week. Repeat BMP at that time.

## 2018-08-08 NOTE — Patient Instructions (Addendum)
Please follow up with your heart doctor Do not take your Torsemide [facility]) until your appointment with your heart doctor.   Please call the clinic if you continue not being able to eat or drink, or if your shortness of breathe worsens.

## 2018-08-08 NOTE — Assessment & Plan Note (Signed)
Diabetes Mellitus, Type 2:  Patient reported a history of type 2 diabetes during his hospitalization. A1C was 8.2. At discharge, he was started on Metformin. Discharge summary suggested started a SGLT1.   Today, Ryan [name] states he has been taking his Metformin as prescribed. A blood glucose today was 175. We did not discuss the addition of another medication today, but will do so at next visit.  Plan:  - Continue Metformin - Recheck blood glucose at next visit.  - Consider adding SGLT1

## 2018-08-09 ENCOUNTER — Telehealth: Payer: Self-pay | Admitting: Internal Medicine

## 2018-08-09 ENCOUNTER — Inpatient Hospital Stay (HOSPITAL_COMMUNITY): Payer: Medicare Other

## 2018-08-09 NOTE — Telephone Encounter (Signed)
Left a VM to call office back. Called to inform of recent BMP results and ask to hold Torsemide till next appt and if possible to make a [facility] appointment for Monday, August 13, 2018. Inform to call if he experiences increased leg swelling or worsening SOB.

## 2018-08-09 NOTE — Telephone Encounter (Signed)
Pt came to his f/u appt yesterday; he was seen by Dr Charleen Kirks.

## 2018-08-09 NOTE — Progress Notes (Signed)
Internal Medicine Clinic Attending  I saw and evaluated the patient.  I personally confirmed the key portions of the history and exam documented by Dr. Charleen Kirks and I reviewed pertinent patient test results.  The assessment, diagnosis, and plan were formulated together and I agree with the documentation in the resident's note.      Patient with decreased PO intake and appears volume down on exam. No evidence of oral candidiasis and tongue appears moist. Lungs are CTA b/l and there is no peripheral edema on exam. Patient's creatinine has worsened since discharge. Will hold torsemide till follow up appointment on Monday. Patient to follow up with cardiology this week. I am concerned given decreased PO intake and overall deconditioning. If no improvement may need readmission for further evaluation and possible palliative care evaluation.

## 2018-08-13 ENCOUNTER — Ambulatory Visit: Payer: Medicare Other

## 2018-08-15 ENCOUNTER — Ambulatory Visit: Payer: Medicare Other

## 2018-08-22 ENCOUNTER — Other Ambulatory Visit: Payer: Self-pay

## 2018-08-22 ENCOUNTER — Emergency Department (HOSPITAL_COMMUNITY): Payer: Medicare Other

## 2018-08-22 ENCOUNTER — Encounter (HOSPITAL_COMMUNITY): Payer: Self-pay | Admitting: Emergency Medicine

## 2018-08-22 ENCOUNTER — Inpatient Hospital Stay (HOSPITAL_COMMUNITY)
Admission: EM | Admit: 2018-08-22 | Discharge: 2018-08-26 | DRG: 291 | Disposition: A | Discharge status: Hospice | Payer: Medicare Other | Attending: Internal Medicine | Admitting: Internal Medicine

## 2018-08-22 DIAGNOSIS — N183 Chronic kidney disease, stage 3 unspecified: Secondary | ICD-10-CM

## 2018-08-22 DIAGNOSIS — E875 Hyperkalemia: Secondary | ICD-10-CM | POA: Diagnosis present

## 2018-08-22 DIAGNOSIS — N179 Acute kidney failure, unspecified: Secondary | ICD-10-CM | POA: Diagnosis present

## 2018-08-22 DIAGNOSIS — R627 Adult failure to thrive: Secondary | ICD-10-CM | POA: Diagnosis present

## 2018-08-22 DIAGNOSIS — E860 Dehydration: Secondary | ICD-10-CM | POA: Diagnosis present

## 2018-08-22 DIAGNOSIS — Z515 Encounter for palliative care: Secondary | ICD-10-CM

## 2018-08-22 DIAGNOSIS — Z87891 Personal history of nicotine dependence: Secondary | ICD-10-CM

## 2018-08-22 DIAGNOSIS — E43 Unspecified severe protein-calorie malnutrition: Secondary | ICD-10-CM | POA: Diagnosis present

## 2018-08-22 DIAGNOSIS — N184 Chronic kidney disease, stage 4 (severe): Secondary | ICD-10-CM | POA: Diagnosis present

## 2018-08-22 DIAGNOSIS — Z7984 Long term (current) use of oral hypoglycemic drugs: Secondary | ICD-10-CM

## 2018-08-22 DIAGNOSIS — Z79899 Other long term (current) drug therapy: Secondary | ICD-10-CM

## 2018-08-22 DIAGNOSIS — K7030 Alcoholic cirrhosis of liver without ascites: Secondary | ICD-10-CM | POA: Diagnosis present

## 2018-08-22 DIAGNOSIS — E1122 Type 2 diabetes mellitus with diabetic chronic kidney disease: Secondary | ICD-10-CM | POA: Diagnosis present

## 2018-08-22 DIAGNOSIS — J90 Pleural effusion, not elsewhere classified: Secondary | ICD-10-CM | POA: Diagnosis not present

## 2018-08-22 DIAGNOSIS — Z681 Body mass index (BMI) 19 or less, adult: Secondary | ICD-10-CM

## 2018-08-22 DIAGNOSIS — I130 Hypertensive heart and chronic kidney disease with heart failure and stage 1 through stage 4 chronic kidney disease, or unspecified chronic kidney disease: Secondary | ICD-10-CM | POA: Diagnosis not present

## 2018-08-22 DIAGNOSIS — Z8249 Family history of ischemic heart disease and other diseases of the circulatory system: Secondary | ICD-10-CM

## 2018-08-22 DIAGNOSIS — I5084 End stage heart failure: Secondary | ICD-10-CM | POA: Diagnosis present

## 2018-08-22 DIAGNOSIS — R0602 Shortness of breath: Secondary | ICD-10-CM

## 2018-08-22 DIAGNOSIS — Z7901 Long term (current) use of anticoagulants: Secondary | ICD-10-CM

## 2018-08-22 DIAGNOSIS — Z66 Do not resuscitate: Secondary | ICD-10-CM | POA: Diagnosis present

## 2018-08-22 DIAGNOSIS — I509 Heart failure, unspecified: Secondary | ICD-10-CM

## 2018-08-22 DIAGNOSIS — Z1159 Encounter for screening for other viral diseases: Secondary | ICD-10-CM

## 2018-08-22 DIAGNOSIS — I5023 Acute on chronic systolic (congestive) heart failure: Secondary | ICD-10-CM | POA: Diagnosis present

## 2018-08-22 LAB — BASIC METABOLIC PANEL
Anion gap: 11 (ref 5–15)
BUN: 41 mg/dL — ABNORMAL HIGH (ref 8–23)
CO2: 19 mmol/L — ABNORMAL LOW (ref 22–32)
Calcium: 9.5 mg/dL (ref 8.9–10.3)
Chloride: 110 mmol/L (ref 98–111)
Creatinine, Ser: 2.26 mg/dL — ABNORMAL HIGH (ref 0.61–1.24)
GFR calc Af Amer: 33 mL/min — ABNORMAL LOW (ref >60)
GFR calc non Af Amer: 29 mL/min — ABNORMAL LOW (ref >60)
Glucose, Bld: 153 mg/dL — ABNORMAL HIGH (ref 70–99)
Potassium: 4.4 mmol/L (ref 3.5–5.1)
Sodium: 140 mmol/L (ref 135–145)

## 2018-08-22 LAB — SARS CORONAVIRUS 2 BY RT PCR (HOSPITAL ORDER, PERFORMED IN ~~LOC~~ HOSPITAL LAB): SARS Coronavirus 2: NEGATIVE

## 2018-08-22 LAB — CBC
HCT: 50.4 % (ref 39.0–52.0)
Hemoglobin: 15.7 g/dL (ref 13.0–17.0)
MCH: 29 pg (ref 26.0–34.0)
MCHC: 31.2 g/dL (ref 30.0–36.0)
MCV: 93 fL (ref 80.0–100.0)
Platelets: 202 10*3/uL (ref 150–400)
RBC: 5.42 MIL/uL (ref 4.22–5.81)
RDW: 14.9 % (ref 11.5–15.5)
WBC: 4.8 10*3/uL (ref 4.0–10.5)
nRBC: 0 % (ref 0.0–0.2)

## 2018-08-22 LAB — URINALYSIS, ROUTINE W REFLEX MICROSCOPIC
Bilirubin Urine: NEGATIVE
Glucose, UA: NEGATIVE mg/dL
Hgb urine dipstick: NEGATIVE
Ketones, ur: NEGATIVE mg/dL
Leukocytes,Ua: NEGATIVE
Nitrite: NEGATIVE
Protein, ur: 100 mg/dL — AB
Specific Gravity, Urine: 1.012 (ref 1.005–1.030)
pH: 5 (ref 5.0–8.0)

## 2018-08-22 LAB — LIPASE, BLOOD: Lipase: 40 U/L (ref 11–51)

## 2018-08-22 LAB — HEPATIC FUNCTION PANEL
ALT: 16 U/L (ref 0–44)
AST: 20 U/L (ref 15–41)
Albumin: 3 g/dL — ABNORMAL LOW (ref 3.5–5.0)
Alkaline Phosphatase: 226 U/L — ABNORMAL HIGH (ref 38–126)
Bilirubin, Direct: 0.3 mg/dL — ABNORMAL HIGH (ref 0.0–0.2)
Indirect Bilirubin: 0.7 mg/dL (ref 0.3–0.9)
Total Bilirubin: 1 mg/dL (ref 0.3–1.2)
Total Protein: 8.2 g/dL — ABNORMAL HIGH (ref 6.5–8.1)

## 2018-08-22 LAB — PROTIME-INR
INR: 1.4 — ABNORMAL HIGH (ref 0.8–1.2)
Prothrombin Time: 17.1 seconds — ABNORMAL HIGH (ref 11.4–15.2)

## 2018-08-22 LAB — TROPONIN I (HIGH SENSITIVITY)
Troponin I (High Sensitivity): 34 ng/L — ABNORMAL HIGH (ref <18)
Troponin I (High Sensitivity): 35 ng/L — ABNORMAL HIGH (ref <18)

## 2018-08-22 LAB — ETHANOL: Alcohol, Ethyl (B): <10 mg/dL (ref <10)

## 2018-08-22 LAB — GLUCOSE, CAPILLARY: Glucose-Capillary: 184 mg/dL — ABNORMAL HIGH (ref 70–99)

## 2018-08-22 LAB — BRAIN NATRIURETIC PEPTIDE: B Natriuretic Peptide: 2207.8 pg/mL — ABNORMAL HIGH (ref 0.0–100.0)

## 2018-08-22 MED ORDER — SENNOSIDES-DOCUSATE SODIUM 8.6-50 MG PO TABS: 1.0000 | ORAL_TABLET | Freq: Every evening | ORAL | Status: DC | PRN

## 2018-08-22 MED ORDER — BUPROPION HCL ER (XL) 150 MG PO TB24
150.0000 mg | ORAL_TABLET | Freq: Every day | ORAL | Status: DC
  Administered 2018-08-22 – 2018-08-25 (×4): 150 mg via ORAL
  Filled 2018-08-22 (×5): qty 1

## 2018-08-22 MED ORDER — ADULT MULTIVITAMIN W/MINERALS CH
1.0000 | ORAL_TABLET | Freq: Every day | ORAL | Status: DC
  Administered 2018-08-22 – 2018-08-26 (×5): 1 via ORAL
  Filled 2018-08-22 (×6): qty 1

## 2018-08-22 MED ORDER — ACETAMINOPHEN 325 MG PO TABS
650.0000 mg | ORAL_TABLET | Freq: Four times a day (QID) | ORAL | Status: DC | PRN
  Administered 2018-08-22 – 2018-08-25 (×2): 650 mg via ORAL
  Filled 2018-08-22 (×2): qty 2

## 2018-08-22 MED ORDER — WARFARIN - PHARMACIST DOSING INPATIENT
Freq: Every day | Status: DC
  Administered 2018-08-23 – 2018-08-25 (×3)

## 2018-08-22 MED ORDER — WARFARIN SODIUM 7.5 MG PO TABS
7.5000 mg | ORAL_TABLET | Freq: Once | ORAL | Status: AC
  Administered 2018-08-22: 7.5 mg via ORAL
  Filled 2018-08-22: qty 1

## 2018-08-22 MED ORDER — SODIUM CHLORIDE 0.9% FLUSH
3.0000 mL | Freq: Two times a day (BID) | INTRAVENOUS | Status: DC
  Administered 2018-08-22 – 2018-08-26 (×7): 3 mL via INTRAVENOUS

## 2018-08-22 MED ORDER — SODIUM CHLORIDE 0.9 % IV BOLUS: 1000.0000 mL | Freq: Once | INTRAVENOUS | Status: DC

## 2018-08-22 MED ORDER — ISOSORBIDE MONONITRATE ER 30 MG PO TB24
15.0000 mg | ORAL_TABLET | Freq: Every day | ORAL | Status: DC
  Administered 2018-08-22 – 2018-08-25 (×4): 15 mg via ORAL
  Filled 2018-08-22 (×4): qty 1

## 2018-08-22 MED ORDER — FUROSEMIDE 10 MG/ML IJ SOLN
40.0000 mg | Freq: Once | INTRAMUSCULAR | Status: AC
  Administered 2018-08-22: 40 mg via INTRAVENOUS
  Filled 2018-08-22: qty 4

## 2018-08-22 MED ORDER — INSULIN ASPART 100 UNIT/ML ~~LOC~~ SOLN
0.0000 [IU] | Freq: Three times a day (TID) | SUBCUTANEOUS | Status: DC
  Administered 2018-08-23: 5 [IU] via SUBCUTANEOUS
  Administered 2018-08-23: 1 [IU] via SUBCUTANEOUS
  Administered 2018-08-24: 2 [IU] via SUBCUTANEOUS
  Administered 2018-08-24: 5 [IU] via SUBCUTANEOUS
  Administered 2018-08-25: 2 [IU] via SUBCUTANEOUS
  Administered 2018-08-25: 5 [IU] via SUBCUTANEOUS
  Administered 2018-08-26: 3 [IU] via SUBCUTANEOUS

## 2018-08-22 MED ORDER — FOLIC ACID 1 MG PO TABS
1.0000 mg | ORAL_TABLET | Freq: Every day | ORAL | Status: DC
  Administered 2018-08-22 – 2018-08-26 (×5): 1 mg via ORAL
  Filled 2018-08-22 (×6): qty 1

## 2018-08-22 MED ORDER — HYDRALAZINE HCL 25 MG PO TABS
12.5000 mg | ORAL_TABLET | Freq: Two times a day (BID) | ORAL | Status: DC
  Administered 2018-08-23 – 2018-08-26 (×6): 12.5 mg via ORAL
  Filled 2018-08-22 (×8): qty 1

## 2018-08-22 MED ORDER — ACETAMINOPHEN 650 MG RE SUPP: 650.0000 mg | Freq: Four times a day (QID) | RECTAL | Status: DC | PRN

## 2018-08-22 MED ORDER — SODIUM CHLORIDE 0.9% FLUSH
3.0000 mL | Freq: Once | INTRAVENOUS | Status: AC
  Administered 2018-08-22: 3 mL via INTRAVENOUS

## 2018-08-22 MED ORDER — VITAMIN B-1 100 MG PO TABS
100.0000 mg | ORAL_TABLET | Freq: Every day | ORAL | Status: DC
  Administered 2018-08-22 – 2018-08-26 (×5): 100 mg via ORAL
  Filled 2018-08-22 (×6): qty 1

## 2018-08-22 NOTE — ED Triage Notes (Signed)
Pt reports he for the last 3 weeks he has had no appetite not eating due to everything tasting like metal. Pt states "im just wasting away". Pt denies any pain.

## 2018-08-22 NOTE — Progress Notes (Signed)
ANTICOAGULATION CONSULT NOTE - Initial Consult  Pharmacy Consult for warfarin Indication: LV thrombus/DVT  Allergies  Allergen Reactions  . Ace Inhibitors Cough    Patient Measurements:     Vital Signs: Temp: 97.5 F (36.4 C) (07/15 1345) Temp Source: Oral (07/15 1345) BP: 109/90 (07/15 1656) Pulse Rate: 110 (07/15 1656)  Labs: Recent Labs    08/22/18 1354 08/22/18 1630  HGB 15.7  --   HCT 50.4  --   PLT 202  --   LABPROT  --  17.1*  INR  --  1.4*  CREATININE 2.26*  --   TROPONINIHS  --  34*    CrCl cannot be calculated (Unknown ideal weight.).   Medical History: Past Medical History:  Diagnosis Date  . Cardiogenic shock ([place]) 07/19/2018  . History of alcohol use disorder   . Pleural effusion 07/14/2018    Assessment: Pt is a 51 YOM presenting w/ c/o no appetite and everything tasting like metal. 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 On coumadin PTA 5mg  daily. Last dose was 7/14 PTA. INR 1.4, goal INR 2-3. Pt currently subtherapeutic, will order one time boosted dose and f/u INR tomorrow. Hgb 15.7 and Plts 202. Confirmed w/ MD okay to dose warfarin w/ thoracentesis pending.  Goal of Therapy:  INR 2-3 Monitor platelets by anticoagulation protocol: Yes   Plan:  Coumadin 7.5mg  x1 F/u INR to dose warfarin Monitor CBC and INR daily   Natale Lay 08/22/2018,6:39 PM

## 2018-08-22 NOTE — ED Notes (Signed)
Attempted to call report to [id number], nurse to call back 5-10 minutes

## 2018-08-22 NOTE — ED Notes (Signed)
ED TO INPATIENT HANDOFF REPORT  ED Nurse Name and Phone #: Kathlee Nations [phone]  S Name/Age/Gender Ryan [name] 68 y.o. male Room/Bed: 017C/017C  Code Status   Code Status: DNR  Home/SNF/Other Home Patient oriented to: self, place, time and situation Is this baseline? Yes   Triage Complete: Triage complete  Chief Complaint Unable to eat  Triage Note Pt reports he for the last 3 weeks he has had no appetite not eating due to everything tasting like metal. Pt states "im just wasting away". Pt denies any pain.     Allergies Allergies  Allergen Reactions  . Ace Inhibitors Cough    Level of Care/Admitting Diagnosis ED Disposition    ED Disposition Condition Comment   Admit  Hospital Area: [facility] [[id number]]  Level of Care: Telemetry Cardiac [103]  Covid Evaluation: Asymptomatic Screening Protocol (No Symptoms)  Diagnosis: CHF (congestive heart failure) ([place]) [[id number]]  Admitting Physician: Sid Falcon [phone]  Attending Physician: Sid Falcon [[id number]  PT Class (Do Not Modify): Observation [104]  PT Acc Code (Do Not Modify): Observation [10022]       B Medical/Surgery History Past Medical History:  Diagnosis Date  . Cardiogenic shock ([place]) 07/19/2018  . History of alcohol use disorder   . Pleural effusion 07/14/2018   Past Surgical History:  Procedure Laterality Date  . IR THORACENTESIS ASP PLEURAL SPACE W/IMG GUIDE  07/16/2018  . IR THORACENTESIS ASP PLEURAL SPACE W/IMG GUIDE  07/26/2018     A IV Location/Drains/Wounds Patient Lines/Drains/Airways Status   Active Line/Drains/Airways    Name:   Placement date:   Placement time:   Site:   Days:   Peripheral IV 07/20/18 Left;Posterior Forearm   07/20/18    0153    Forearm   33   Peripheral IV 08/22/18 Right Antecubital   08/22/18    1644    Antecubital   less than 1          Intake/Output Last 24 hours No intake or output data in the 24 hours ending 08/22/18 1756  Labs/Imaging Results  for orders placed or performed during the hospital encounter of 08/22/18 (from the past 48 hour(s))  Basic metabolic panel     Status: Abnormal   Collection Time: 08/22/18  1:54 PM  Result Value Ref Range   Sodium 140 135 - 145 mmol/L   Potassium 4.4 3.5 - 5.1 mmol/L   Chloride 110 98 - 111 mmol/L   CO2 19 (L) 22 - 32 mmol/L   Glucose, Bld 153 (H) 70 - 99 mg/dL   BUN 41 (H) 8 - 23 mg/dL   Creatinine, Ser 2.26 (H) 0.61 - 1.24 mg/dL   Calcium 9.5 8.9 - 10.3 mg/dL   GFR calc non Af Amer 29 (L) >60 mL/min   GFR calc Af Amer 33 (L) >60 mL/min   Anion gap 11 5 - 15    Comment: Performed at [facility], [address], [place] [address]  CBC     Status: None   Collection Time: 08/22/18  1:54 PM  Result Value Ref Range   WBC 4.8 4.0 - 10.5 K/uL   RBC 5.42 4.22 - 5.81 MIL/uL   Hemoglobin 15.7 13.0 - 17.0 g/dL   HCT 50.4 39.0 - 52.0 %   MCV 93.0 80.0 - 100.0 fL   MCH 29.0 26.0 - 34.0 pg   MCHC 31.2 30.0 - 36.0 g/dL   RDW 14.9 11.5 - 15.5 %  Platelets 202 150 - 400 K/uL   nRBC 0.0 0.0 - 0.2 %    Comment: Performed at [facility], [place] [address] [place], [place] [address]  Hepatic function panel     Status: Abnormal   Collection Time: 08/22/18  3:19 PM  Result Value Ref Range   Total Protein 8.2 (H) 6.5 - 8.1 g/dL   Albumin 3.0 (L) 3.5 - 5.0 g/dL   AST 20 15 - 41 U/L   ALT 16 0 - 44 U/L   Alkaline Phosphatase 226 (H) 38 - 126 U/L   Total Bilirubin 1.0 0.3 - 1.2 mg/dL   Bilirubin, Direct 0.3 (H) 0.0 - 0.2 mg/dL   Indirect Bilirubin 0.7 0.3 - 0.9 mg/dL    Comment: Performed at [facility] [address], [place], [place] [address]  Lipase, blood     Status: None   Collection Time: 08/22/18  3:19 PM  Result Value Ref Range   Lipase 40 11 - 51 U/L    Comment: Performed at [facility], [place] [address], [place], [place] [address]  Brain natriuretic peptide     Status: Abnormal   Collection Time: 08/22/18  3:22 PM  Result Value Ref Range   B  Natriuretic Peptide 2,207.8 (H) 0.0 - 100.0 pg/mL    Comment: Performed at [facility] [address], [place], [place] [address]  Protime-INR     Status: Abnormal   Collection Time: 08/22/18  4:30 PM  Result Value Ref Range   Prothrombin Time 17.1 (H) 11.4 - 15.2 seconds   INR 1.4 (H) 0.8 - 1.2    Comment: (NOTE) INR goal varies based on device and disease states. Performed at [facility], [place] [address] [place], [place] [address]   Troponin I (High Sensitivity)     Status: Abnormal   Collection Time: 08/22/18  4:30 PM  Result Value Ref Range   Troponin I (High Sensitivity) 34 (H) <18 ng/L    Comment: (NOTE) Elevated high sensitivity troponin I (hsTnI) values and significant  changes across serial measurements may suggest ACS but many other  chronic and acute conditions are known to elevate hsTnI results.  Refer to the "Links" section for chest pain algorithms and additional  guidance. Performed at [facility], [place] [address] [place], [place] [address]   Ethanol     Status: None   Collection Time: 08/22/18  4:30 PM  Result Value Ref Range   Alcohol, Ethyl (B) <10 <10 mg/dL    Comment: (NOTE) Lowest detectable limit for serum alcohol is 10 mg/dL. For medical purposes only. Performed at [facility], [place] [address] [place], [place] [address]    Dg Chest Port 1 View  Result Date: 08/22/2018 CLINICAL DATA:  Weight loss and weakness EXAM: PORTABLE CHEST 1 VIEW COMPARISON:  July 30, 2018 FINDINGS: There is a calcified granuloma in the right mid lung. There is no edema or consolidation. There is cardiomegaly with pulmonary vascularity normal. No adenopathy. There is aortic atherosclerosis. No bone lesions. IMPRESSION: Calcified granuloma right mid lung. No edema or consolidation. Stable cardiomegaly. Aortic Atherosclerosis (ICD10-I70.0). Electronically Signed   By: Lowella Grip III M.D.   On: 08/22/2018 15:49   Ct Renal Stone Study  Result Date:  08/22/2018 CLINICAL DATA:  Nausea, vomiting, weight loss EXAM: CT ABDOMEN AND PELVIS WITHOUT CONTRAST TECHNIQUE: Multidetector CT imaging of the abdomen and pelvis was performed following the standard protocol  without IV contrast. COMPARISON:  CT chest abdomen pelvis, 07/15/2018 FINDINGS: Lower chest: Moderate right pleural effusion, decreased in volume compared to prior examination, with redemonstrated atelectasis or consolidation of the right lower lobe. Interval resolution of a previously seen left pleural effusion. Cardiomegaly. Hepatobiliary: No solid liver abnormality is seen. Coarse contour of the liver. Gallstones. Gallbladder wall thickening, or biliary dilatation. Pancreas: Unremarkable. No pancreatic ductal dilatation or surrounding inflammatory changes. Spleen: Normal in size without significant abnormality. Adrenals/Urinary Tract: Adrenal glands are unremarkable. Nonobstructive superior pole calculus of the left kidney. No hydronephrosis. Bladder is unremarkable. Stomach/Bowel: Stomach is within normal limits. Appendix appears normal. No evidence of bowel wall thickening, distention, or inflammatory changes. Vascular/Lymphatic: Aortic atherosclerosis. No enlarged abdominal or pelvic lymph nodes. Reproductive: No mass or other significant abnormality. Other: No abdominal wall hernia or abnormality. No abdominopelvic ascites. Musculoskeletal: No acute or significant osseous findings. IMPRESSION: 1. No acute noncontrast CT findings of the abdomen or pelvis to explain nausea, vomiting, or weight loss. 2. Moderate right pleural effusion, decreased in volume compared to prior examination, with redemonstrated atelectasis or consolidation of the right lower lobe. Interval resolution of a previously seen left pleural effusion. 3.  Coarse contour of the liver, suggestive of cirrhosis. 4.  Cholelithiasis. Electronically Signed   By: Eddie Candle M.D.   On: 08/22/2018 16:18    Pending Labs Unresulted Labs (From  admission, onward)    Start     Ordered   08/22/18 1521  SARS Coronavirus 2 (CEPHEID - Performed in [facility] lab), Hosp Order  (Asymptomatic Patients Labs)  Once,   STAT    Question:  Rule Out  Answer:  Yes   08/22/18 1520   08/22/18 1352  Urinalysis, Routine w reflex microscopic  Once,   STAT     08/22/18 1352          Vitals/Pain Today's Vitals   08/22/18 1500 08/22/18 1515 08/22/18 1528 08/22/18 1656  BP: (!) 113/97 (!) 120/104  109/90  Pulse: (!) 114 (!) 116  (!) 110  Resp: (!) 25 (!) 0  20  Temp:      TempSrc:      SpO2: 99% 100%  97%  PainSc:   10-Worst pain ever     Isolation Precautions No active isolations  Medications Medications  hydrALAZINE (APRESOLINE) tablet 12.5 mg (has no administration in time range)  isosorbide mononitrate (IMDUR) 24 hr tablet 15 mg (has no administration in time range)  buPROPion (WELLBUTRIN XL) 24 hr tablet 150 mg (has no administration in time range)  sodium chloride flush (NS) 0.9 % injection 3 mL (has no administration in time range)  acetaminophen (TYLENOL) tablet 650 mg (has no administration in time range)    Or  acetaminophen (TYLENOL) suppository 650 mg (has no administration in time range)  senna-docusate (Senokot-S) tablet 1 tablet (has no administration in time range)  insulin aspart (novoLOG) injection 0-9 Units (has no administration in time range)  sodium chloride flush (NS) 0.9 % injection 3 mL (3 mLs Intravenous Given 08/22/18 1645)  furosemide (LASIX) injection 40 mg (40 mg Intravenous Given 08/22/18 1650)    Mobility walks     Focused Assessments obs   R Recommendations: See Admitting Provider Note  Report given to:   Additional Notes: none

## 2018-08-22 NOTE — H&P (Signed)
Date: 08/22/2018               [name] Name:  Ryan [name] MRN: [id number]  DOB: Mar 18, 1950 Age / Sex: 68 y.o., male   PCP: Ryan Persia, MD         Medical Service: Internal Medicine Teaching Service         Attending Physician: Dr. Darl Householder Lujean Rave, MD    First Contact: Dr. Sheppard Coil  Pager: [phone]  Second Contact: Dr. Trilby Drummer Pager: [phone]       After Hours (After 5p/  First Contact Pager: [phone]  weekends / holidays): Second Contact Pager: [phone]   Chief Complaint: Malaise  History of Present Illness: Ryan [name] is a 68 year old male with a history of HFrEF (EF 15%), HTN, T2DM, cirrhosis secondary to alcohol use, left ventricular thrombus, DVT, and CKD who presents to Ryan emergency room for, generalized weakness and fatigue and decreased appetite. Ryan [name] was recently hospitalized and discharged on 08/01/2018 for management of heart failure exacerbation with LV thrombus and transudative pleural effusion, s/p thoracocentesis. Since then, Ryan [name] says Ryan [name] has lost 30 pounds and has had low energy. His appetite has not been good. Ryan [name] says that Ryan [name] feels hungry Ryan [name] only can take a few days before getting full. Ryan [name] says Ryan [name] has had a poor appetite for Ryan last several weeks. Ryan [name] did follow up with his PCP about 2 weeks ago and was found to have elevated creatinine, raising concern for AKI.  Ryan decision was made to stopped his home torsemide 40 mg at that time, which Ryan [name] hasn't taken at all since then.  Ryan [name] also has noticed his skin is dry on his arms and legs despite putting on lotion. Otherwise, Ryan [name] denies having fevers chills, headaches, lightheadedness, changes in bowel or bladder habits, lower extremity swelling.   In Ryan emergency room, a number of labs were drawn including CBC, BMP, BNP, hepatic function panel, lipase, and UA.  Remarkable labs to note are elevated BNP of 2207.8, Alk Phos of 226, INR of 1.4 and troponin slightly elevated at 34 and 35.   Chest x-ray unremarkable.  CT abdomen which had no acute findings, but a moderate right-sided pleural effusion was present, however it was decreased from previous admission. Received one time dose of 47m IV Lasix.  Meds:  Current Meds  Medication Sig  . buPROPion (WELLBUTRIN XL) 150 MG 24 hr tablet Take 1 tablet (150 mg total) by mouth daily. ([name] taking differently: Take 150 mg by mouth at bedtime. )  . hydrALAZINE (APRESOLINE) 25 MG tablet Take 0.5 tablets (12.5 mg total) by mouth 2 times daily at 12 noon and 4 pm.  . isosorbide mononitrate (IMDUR) 30 MG 24 hr tablet Take 0.5 tablets (15 mg total) by mouth at bedtime.  . metFORMIN (GLUCOPHAGE) 500 MG tablet Take 1 tablet (500 mg total) by mouth 2 (two) times daily with a meal.  . Multiple Vitamins-Minerals (ONE-A-DAY PROACTIVE 65+) TABS Take 1 tablet by mouth daily.  .Marland Kitchentorsemide (DEMADEX) 20 MG tablet Take 2 tablets (40 mg total) by mouth daily.  .Marland Kitchenwarfarin (COUMADIN) 5 MG tablet Take 1 tablet (5 mg total) by mouth daily at 6 PM. ([name] taking differently: Take 5 mg by mouth every evening. )    Allergies: Allergies as of 08/22/2018 - Review Complete 08/22/2018  Allergen Reaction Noted  . Ace inhibitors Cough 07/13/2018   Past Medical History:  Diagnosis Date  . Cardiogenic shock ([place] 07/19/2018  .  History of alcohol use disorder   . Pleural effusion 07/14/2018   Family History:  Ryan [name] endorses hypertension that runs in Ryan family.  Ryan [name] denies a family history of cancer.  Social History:  - [place] [employer] veteran - Lives with a roommate who is also noticed his rapid weight loss.  - Used to drink 1/5 of alcohol a day but has not drank alcohol in 15 years. Per chart review, last drink was 2.5 months ago.  - Ex-smoker, stopped smoking 15 years ago.  Does not remember how many cigarettes Ryan [name] smoked.  Review of Systems: A complete ROS was negative except as per HPI.  Physical Exam: Blood pressure (!) 120/104, pulse (!) 116,  temperature (!) 97.5 F (36.4 C), temperature source Oral, resp. rate (!) 0, SpO2 100 %.   Physical Exam Constitutional:      General: Ryan [name] is not in acute distress.    Appearance: Ryan [name] is ill-appearing. Ryan [name] is not toxic-appearing or diaphoretic.  HENT:     Head: Normocephalic and atraumatic.     Mouth/Throat:     Mouth: Mucous membranes are moist.     Pharynx: No oropharyngeal exudate or posterior oropharyngeal erythema.  Cardiovascular:     Rate and Rhythm: Regular rhythm. Tachycardia present.     Pulses: Normal pulses.     Heart sounds: Normal heart sounds. No murmur. No friction rub. No gallop.   Pulmonary:     Effort: Pulmonary effort is normal. No respiratory distress.     Breath sounds: Normal breath sounds. No wheezing or rales.  Abdominal:     General: Abdomen is flat. Bowel sounds are normal. There is no distension.     Palpations: Abdomen is soft.     Tenderness: There is no abdominal tenderness. There is no guarding.  Musculoskeletal: Normal range of motion.        General: No deformity or signs of injury.     Right lower leg: No edema.     Left lower leg: No edema.     Comments: Appears cachectic, moves all 4 extremities spontaneously antigravity  Skin:    Coloration: Skin is not jaundiced.     Comments: Dried and peeling skin on his forearms and shins.  Does not itch  Neurological:     General: No focal deficit present.     Mental Status: Ryan [name] is alert and oriented to person, place, and time.     Cranial Nerves: No cranial nerve deficit.     Motor: No weakness.     Coordination: Coordination normal.  Psychiatric:        Mood and Affect: Mood normal.        Behavior: Behavior normal.        Thought Content: Thought content normal.        Judgment: Judgment normal.    EKG: Personally reviewed and did not appreciate signs of ischemia. IMPRESSION: Sinus tachycardia Left atrial enlargement Left ventricular hypertrophy with repolarization abnormality  CXR:   IMPRESSION: Calcified granuloma right mid lung. No edema or consolidation. Stable cardiomegaly. Aortic Atherosclerosis (ICD10-I70.0).  CT Renal Stone Study: IMPRESSION: 1. No acute noncontrast CT findings of Ryan abdomen or pelvis to explain nausea, vomiting, or weight loss.  2. Moderate right pleural effusion, decreased in volume compared to prior examination, with redemonstrated atelectasis or consolidation of Ryan right lower lobe. Interval resolution of a previously seen left pleural effusion.  3.  Coarse contour of Ryan liver, suggestive of cirrhosis.  4.  Cholelithiasis  Assessment & Plan by Problem: Active Problems:   * No active hospital problems. *  In summary Ryan [name] is a 68 year old male with a history of advanced HFrEF (EF 15%), T2DM, cirrhosis secondary to alcohol use, left ventricular thrombus, DVT, and CKD who presented to Ryan ED for increased malaise accompanied by decreased appetite.  At this moment his volume status appears to be euvolemic, and does not appear to be in a decompensated CHF exacerbation despite Ryan highly elevated BNP of 2207.8. Ryan pleural effusion on his lungs is notably better on imaging then his previous admission.  Its also unclear Ryan cause of his decreased appetite and severe weight loss over Ryan last few weeks.  Ryan differential at this time is broad, but includes CHF exacerbation, NSTEMI, atypical pneumonia, fluid overload secondary to hepatic failure, or malignancy.  #LV Thrombus  #HFrEF (EF 15%): Ryan [name] recently stopped taking his torsemide 2 weeks ago at Ryan discretion of his PCP due to concern over his recovering AKI. Ryan malaise and poor energy could potentially be due to Ryan poor condition of his heart.  Ryan [name] appears to be euvolemic on exam, with no crackles in his lungs or fluid buildup in his abdomen or lower extremities. - Pleural effusion present on CT scan, however Ryan [name] does not currently endorse shortness of breath will  not diurese right now.  We will continue to monitor through Ryan night and reassess in Ryan AM -Daily weights - Warfarin 7.5 mg daily  #CKD #Alcoholic Cirrhosis  #FEN/GI #Failure to thrive: Ryan [name] has endorsed a 30 pound weight loss and malaise since his previous admission a few weeks ago. Ryan [name] is also endorsed a poor appetite and has not been eaten or drinking much as of lately. It is possible his kidney disease could be advancing at this point.  No splenomegaly on exam that could explain early satiety. -Dietitian consult in Ryan AM, full liquid diet. - CIWA protocol -Bowel regimen: Senokot tablet  #T2DM: Last A1c is 8.3 as of 08/01/2018. Takes metformin at home. -DC metformin -SSI  #HTN: stable vitals on admission - Hydralazine 12.5 mg twice daily - Imdur 15 mg nightly  #Code Status: DNR  Dispo: Admit [name] to Inpatient with expected length of stay greater than 2 midnights.  [name] says it is okay to call his daughter, Olevia Perches [phone]) for updates.   Signed: Earlene Plater, MD Internal Medicine, PGY1 Pager: [phone]  08/22/2018,4:53 PM

## 2018-08-22 NOTE — ED Provider Notes (Signed)
[facility] EMERGENCY DEPARTMENT Provider Note   CSN: [id number] Arrival date & time: 08/22/18  1338     History   Chief Complaint Chief Complaint  Patient presents with  . Dehydration    HPI Maleek Craver is a 68 y.o. male hx of DM, alcohol cirrhosis, here presenting with failure to thrive.  Patient states that he has been having poor appetite for the last 3 weeks.  Patient states he lost about 30 pounds over the last several weeks.  He was admitted to the hospital about a month ago for heart failure and was found to have LV thrombus and pleural effusion.  Patient eventually had a thoracentesis and was sent home about 3 weeks ago.  States that since then he continues to feel unwell and continues to lose weight.  He did have a follow-up with his doctor about 2 weeks ago and had mild AKI so his torsemide was stopped.  Denies any leg swelling or chest pain or shortness of breath.  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     The history is provided by the patient.    Past Medical History:  Diagnosis Date  . Cardiogenic shock ([place]) 07/19/2018  . History of alcohol use disorder   . Pleural effusion 07/14/2018    Patient Active Problem List   Diagnosis Date Noted  . 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 Diabetes mellitus, type 2 ([place]) 08/08/2018  . LV (left ventricular) mural thrombus without MI 07/31/2018  . Right leg DVT ([place]) 07/31/2018  . Protein-calorie malnutrition, severe 07/31/2018  . Proteinuria 07/14/2018  . Lactic acidosis 07/14/2018  . Cholestasis 07/14/2018  . Thrombocytopenia ([place]) 07/14/2018  . Alcoholic cirrhosis ([place]) 13/24/4010  . Hyperkalemia 07/14/2018  . Metabolic acidosis, normal anion gap (NAG) 07/14/2018  . Acute kidney injury superimposed on chronic kidney disease ([place]) 07/13/2018  . History of alcohol use disorder 07/13/2018  . Weight loss, unintentional 07/13/2018    Past  Surgical History:  Procedure Laterality Date  . IR THORACENTESIS ASP PLEURAL SPACE W/IMG GUIDE  07/16/2018  . IR THORACENTESIS ASP PLEURAL SPACE W/IMG GUIDE  07/26/2018        Home Medications    Prior to Admission medications   Medication Sig Start Date End Date Taking? Authorizing Provider  buPROPion (WELLBUTRIN XL) 150 MG 24 hr tablet Take 1 tablet (150 mg total) by mouth daily. Patient taking differently: Take 150 mg by mouth at bedtime.  08/02/18  Yes [name], MD  hydrALAZINE (APRESOLINE) 25 MG tablet Take 0.5 tablets (12.5 mg total) by mouth 2 times daily at 12 noon and 4 pm. 08/01/18 08/01/19 Yes [name], MD  isosorbide mononitrate (IMDUR) 30 MG 24 hr tablet Take 0.5 tablets (15 mg total) by mouth at bedtime. 08/01/18  Yes [name], MD  metFORMIN (GLUCOPHAGE) 500 MG tablet Take 1 tablet (500 mg total) by mouth 2 (two) times daily with a meal. 08/01/18 08/01/19 Yes [name], MD  Multiple Vitamins-Minerals (ONE-A-DAY PROACTIVE 65+) TABS Take 1 tablet by mouth daily.   Yes [provider]  torsemide (DEMADEX) 20 MG tablet Take 2 tablets (40 mg total) by mouth daily. 08/02/18  Yes [name], MD  warfarin (COUMADIN) 5 MG tablet Take 1 tablet (5 mg total) by mouth daily at 6 PM. Patient taking differently: Take 5 mg by mouth every evening.  08/01/18  Yes [name], MD  ivabradine Steffanie Dunn)  5 MG TABS tablet Take 1 tablet (5 mg total) by mouth 2 (two) times daily with a meal. Patient not taking: Reported on 08/22/2018 08/01/18   [name], MD    Family History No family history on file.  Social History Social History   Tobacco Use  . Smoking status: Former Research scientist (life sciences)  . Smokeless tobacco: Never Used  Substance Use Topics  . Alcohol use: Not Currently    Frequency: Never  . Drug use: Never     Allergies   Ace inhibitors   Review of Systems Review of Systems  All other systems reviewed and are negative.    Physical Exam Updated Vital Signs  BP (!) 114/91 (BP Location: Right Arm)   Pulse (!) 120   Temp (!) 97.5 F (36.4 C) (Oral)   Resp 15   SpO2 98%   Physical Exam Vitals signs and nursing note reviewed.  Constitutional:      Comments: Chronically ill   HENT:     Head: Normocephalic.     Right Ear: Tympanic membrane normal.     Left Ear: Tympanic membrane normal.     Nose: Nose normal.  Eyes:     Pupils: Pupils are equal, round, and reactive to light.  Neck:     Musculoskeletal: Normal range of motion.  Cardiovascular:     Rate and Rhythm: Normal rate and regular rhythm.  Pulmonary:     Comments: Crackles R base  Abdominal:     Palpations: Abdomen is soft.  Musculoskeletal:     Comments: 1+ edema bilaterally   Skin:    General: Skin is warm.     Capillary Refill: Capillary refill takes less than 2 seconds.  Neurological:     General: No focal deficit present.     Mental Status: He is oriented to person, place, and time.  Psychiatric:        Mood and Affect: Mood normal.        Behavior: Behavior normal.      ED Treatments / Results  Labs (all labs ordered are listed, but only abnormal results are displayed) Labs Reviewed  BASIC METABOLIC PANEL - Abnormal; Notable for the following components:      Result Value   CO2 19 (*)    Glucose, Bld 153 (*)    BUN 41 (*)    Creatinine, Ser 2.26 (*)    GFR calc non Af Amer 29 (*)    GFR calc Af Amer 33 (*)    All other components within normal limits  SARS CORONAVIRUS 2 (HOSPITAL ORDER, [facility])  CBC  URINALYSIS, ROUTINE W REFLEX MICROSCOPIC  PROTIME-INR  ETHANOL  HEPATIC FUNCTION PANEL  LIPASE, BLOOD  BRAIN NATRIURETIC PEPTIDE  TROPONIN I (HIGH SENSITIVITY)    EKG EKG Interpretation  Date/Time:  Wednesday August 22 2018 13:50:16 EDT Ventricular Rate:  121 PR Interval:  146 QRS Duration: 94 QT Interval:  338 QTC Calculation: 479 R Axis:   -6 Text Interpretation:  Sinus tachycardia Left atrial enlargement Left  ventricular hypertrophy with repolarization abnormality Abnormal ECG Since last tracing rate faster Confirmed by Wandra Arthurs [phone]) on 08/22/2018 3:08:01 PM   Radiology No results found.  Procedures Procedures (including critical care time)   Angiocath insertion Performed by: Wandra Arthurs  Consent: Verbal consent obtained. Risks and benefits: risks, benefits and alternatives were discussed Time out: Immediately prior to procedure a "time out" was called to verify the correct patient, procedure,  equipment, support staff and site/side marked as required.  Preparation: Patient was prepped and draped in the usual sterile fashion.  Vein Location: R antecube  Ultrasound Guided  Gauge: 20   Normal blood return and flush without difficulty Patient tolerance: Patient tolerated the procedure well with no immediate complications.     Medications Ordered in ED Medications  sodium chloride flush (NS) 0.9 % injection 3 mL (has no administration in time range)  sodium chloride 0.9 % bolus 1,000 mL (has no administration in time range)     Initial Impression / Assessment and Plan / ED Course  I have reviewed the triage vital signs and the nursing notes.  Pertinent labs & imaging results that were available during my care of the patient were reviewed by me and considered in my medical decision making (see chart for details).       Lelynd Eicher is a 68 y.o. male here with failure to thrive, poor appetite.  Patient did lose some weight and clinically appears slightly dry .  However he does have recurrent pleural effusions so consider recurrent pleural effusions versus malignancy versus worsening alcohol cirrhosis.  Patient is clear that he hasn't been drinking alcohol.   5:07 PM BNP 2200. CXR showed moderate R pleural effusion. I am unclear why patient has recurrent effusion. Had thoracentesis a month ago. BNP elevated so ordered lasix. Will admit for thoracentesis.   Final  Clinical Impressions(s) / ED Diagnoses   Final diagnoses:  None    ED Discharge Orders    None       Drenda Freeze, MD 08/22/18 1709

## 2018-08-23 ENCOUNTER — Observation Stay (HOSPITAL_COMMUNITY): Payer: Medicare Other

## 2018-08-23 ENCOUNTER — Encounter (HOSPITAL_COMMUNITY): Payer: Self-pay

## 2018-08-23 ENCOUNTER — Other Ambulatory Visit: Payer: Self-pay

## 2018-08-23 DIAGNOSIS — Z7901 Long term (current) use of anticoagulants: Secondary | ICD-10-CM

## 2018-08-23 DIAGNOSIS — I5023 Acute on chronic systolic (congestive) heart failure: Secondary | ICD-10-CM | POA: Diagnosis not present

## 2018-08-23 DIAGNOSIS — R627 Adult failure to thrive: Secondary | ICD-10-CM

## 2018-08-23 DIAGNOSIS — Z7984 Long term (current) use of oral hypoglycemic drugs: Secondary | ICD-10-CM

## 2018-08-23 DIAGNOSIS — Z86718 Personal history of other venous thrombosis and embolism: Secondary | ICD-10-CM

## 2018-08-23 DIAGNOSIS — N189 Chronic kidney disease, unspecified: Secondary | ICD-10-CM

## 2018-08-23 DIAGNOSIS — I130 Hypertensive heart and chronic kidney disease with heart failure and stage 1 through stage 4 chronic kidney disease, or unspecified chronic kidney disease: Secondary | ICD-10-CM | POA: Diagnosis not present

## 2018-08-23 DIAGNOSIS — I240 Acute coronary thrombosis not resulting in myocardial infarction: Secondary | ICD-10-CM

## 2018-08-23 DIAGNOSIS — Z66 Do not resuscitate: Secondary | ICD-10-CM

## 2018-08-23 DIAGNOSIS — R791 Abnormal coagulation profile: Secondary | ICD-10-CM

## 2018-08-23 DIAGNOSIS — E1122 Type 2 diabetes mellitus with diabetic chronic kidney disease: Secondary | ICD-10-CM

## 2018-08-23 DIAGNOSIS — K7030 Alcoholic cirrhosis of liver without ascites: Secondary | ICD-10-CM

## 2018-08-23 DIAGNOSIS — Z79899 Other long term (current) drug therapy: Secondary | ICD-10-CM

## 2018-08-23 DIAGNOSIS — E43 Unspecified severe protein-calorie malnutrition: Secondary | ICD-10-CM

## 2018-08-23 DIAGNOSIS — J439 Emphysema, unspecified: Secondary | ICD-10-CM

## 2018-08-23 DIAGNOSIS — R0602 Shortness of breath: Secondary | ICD-10-CM

## 2018-08-23 LAB — COMPREHENSIVE METABOLIC PANEL
ALT: 16 U/L (ref 0–44)
AST: 22 U/L (ref 15–41)
Albumin: 2.7 g/dL — ABNORMAL LOW (ref 3.5–5.0)
Alkaline Phosphatase: 198 U/L — ABNORMAL HIGH (ref 38–126)
Anion gap: 11 (ref 5–15)
BUN: 41 mg/dL — ABNORMAL HIGH (ref 8–23)
CO2: 18 mmol/L — ABNORMAL LOW (ref 22–32)
Calcium: 9.1 mg/dL (ref 8.9–10.3)
Chloride: 110 mmol/L (ref 98–111)
Creatinine, Ser: 2.22 mg/dL — ABNORMAL HIGH (ref 0.61–1.24)
GFR calc Af Amer: 34 mL/min — ABNORMAL LOW (ref >60)
GFR calc non Af Amer: 29 mL/min — ABNORMAL LOW (ref >60)
Glucose, Bld: 120 mg/dL — ABNORMAL HIGH (ref 70–99)
Potassium: 4.2 mmol/L (ref 3.5–5.1)
Sodium: 139 mmol/L (ref 135–145)
Total Bilirubin: 0.9 mg/dL (ref 0.3–1.2)
Total Protein: 7.1 g/dL (ref 6.5–8.1)

## 2018-08-23 LAB — PROTIME-INR
INR: 1.7 — ABNORMAL HIGH (ref 0.8–1.2)
Prothrombin Time: 19.9 seconds — ABNORMAL HIGH (ref 11.4–15.2)

## 2018-08-23 LAB — GLUCOSE, CAPILLARY
Glucose-Capillary: 107 mg/dL — ABNORMAL HIGH (ref 70–99)
Glucose-Capillary: 283 mg/dL — ABNORMAL HIGH (ref 70–99)
Glucose-Capillary: 131 mg/dL — ABNORMAL HIGH (ref 70–99)
Glucose-Capillary: 149 mg/dL — ABNORMAL HIGH (ref 70–99)

## 2018-08-23 MED ORDER — WARFARIN SODIUM 5 MG PO TABS
5.0000 mg | ORAL_TABLET | Freq: Once | ORAL | Status: AC
  Administered 2018-08-23: 5 mg via ORAL
  Filled 2018-08-23: qty 1

## 2018-08-23 MED ORDER — WARFARIN SODIUM 7.5 MG PO TABS: 7.5000 mg | ORAL_TABLET | Freq: Once | ORAL | Status: DC

## 2018-08-23 MED ORDER — ORAL CARE MOUTH RINSE
15.0000 mL | Freq: Two times a day (BID) | OROMUCOSAL | Status: DC
  Administered 2018-08-23 – 2018-08-26 (×5): 15 mL via OROMUCOSAL

## 2018-08-23 NOTE — Progress Notes (Addendum)
[facility] for warfarin Indication: LV thrombus/DVT  Allergies  Allergen Reactions  . Ace Inhibitors Cough    Patient Measurements: Weight: 117 lb 6.4 oz (53.3 kg)   Vital Signs: Temp: 97.4 F (36.3 C) (07/16 0455) Temp Source: Oral (07/16 0455) BP: 98/80 (07/16 1005) Pulse Rate: 107 (07/16 0455)  Labs: Recent Labs    08/22/18 1354 08/22/18 1630 08/22/18 1905 08/23/18 0619  HGB 15.7  --   --   --   HCT 50.4  --   --   --   PLT 202  --   --   --   LABPROT  --  17.1*  --  19.9*  INR  --  1.4*  --  1.7*  CREATININE 2.26*  --   --  2.22*  TROPONINIHS  --  34* 35*  --     Estimated Creatinine Clearance: 23.7 mL/min (A) (by C-G formula based on SCr of 2.22 mg/dL (H)).   Medical History: Past Medical History:  Diagnosis Date  . Cardiogenic shock ([place]) 07/19/2018  . History of alcohol use disorder   . Pleural effusion 07/14/2018    Assessment: Pt is a 33 YOM presenting w/ c/o no appetite and general maliase. 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 On coumadin PTA 5mg  daily.  -INR= 1.7    Goal of Therapy:  INR 2-3 Monitor platelets by anticoagulation protocol: Yes   Plan:  Coumadin 5mg  x1 (anticipate further INR trend up) INR daily  Hildred Laser, PharmD Clinical Pharmacist **Pharmacist phone directory can now be found on [email or web address] ([email or web address]).  Listed under [facility].

## 2018-08-23 NOTE — Progress Notes (Signed)
Initial Nutrition Assessment  DOCUMENTATION CODES:   Severe malnutrition in context of chronic illness  INTERVENTION:  - Ensure Enlive po BID, each supplement provides 350 kcal and 20 grams of protein -Magic cup TID with meals, each supplement provides 290 kcal and 9 grams of protein  -Continue with MVI   NUTRITION DIAGNOSIS:   Severe Malnutrition related to chronic illness as evidenced by energy intake < or equal to 75% for > or equal to 1 month, per patient/family report, percent weight loss.   GOAL:   Patient will meet greater than or equal to 90% of their needs  MONITOR:   PO intake, Labs, Supplement acceptance, Weight trends, I & O's  REASON FOR ASSESSMENT:   Consult Assessment of nutrition requirement/status  ASSESSMENT:  68 year old male with past medical history significant of T2DM, EtoH cirrhosis, biventricular heart failure, DVT, recent pleural effusion and IR thoracentesis who presented to ED with dehydration, poor PO and unintentional wt loss  RD working remotely  Unable to reach patient via phone today. Patient with recent 6/5 [facility] admission where he was diagnosed with biventricular heart failure. Patient noted to have presence of severe fat and muscle depletion at that time.   6/8- thoracentesis 6/18 - thoracentesis with 1.5 L fluid removed  Weight reviewed - pt lost total of 23.8 lbs during admission; suspected some losses related to diuresis  6/11: 72.9 kg 6/23: 58.7 kg 7/16: 53.3 kg  Patient with additional 11.9 lb (9.2%) loss over past 3 weeks- severe for time frame  Patient with 100% meal completion and 100% ONS this morning  Medications reviewed and include: folic acid, SSI, Imdur, MVI, thiamine Labs reviewed  Intake/Output Summary (Last 24 hours) at 08/23/2018 1208 Last data filed at 08/23/2018 0911 Gross per 24 hour  Intake 480 ml  Output 500 ml  Net -20 ml     NUTRITION - FOCUSED PHYSICAL EXAM: Unable to complete at this time -  suspect 6/23 severe malnutrition diagnosis remains appropriate; given additional wt losses and pt reported poor PO   Diet Order:   Diet Order            Diet Heart Room service appropriate? Yes; Fluid consistency: Thin; Fluid restriction: 1200 mL Fluid  Diet effective now              EDUCATION NEEDS:   Not appropriate for education at this time  Skin:  Skin Assessment: Reviewed RN Assessment  Last BM:  unknown  Height:   Ht Readings from Last 1 Encounters:  07/19/18 5\' 8"  (1.727 m)    Weight:   Wt Readings from Last 1 Encounters:  08/23/18 53.3 kg    Ideal Body Weight:  70 kg  BMI:  Body mass index is 17.85 kg/m.  Estimated Nutritional Needs:   Kcal:  1900-2100  Protein:  80-91g  Fluid:  1.2L per MD   Lajuan Lines, RD, LDN  After Hours/Weekend Pager: [phone]

## 2018-08-23 NOTE — Progress Notes (Signed)
  Date: 08/23/2018  Patient name: Ryan [name]  Medical record number: [id number]  Date of birth: 07-Feb-1951   I have seen and evaluated Ryan [name] and discussed their care with the Residency Team. Briefly, Ryan [name] is a 68 year old man with HFrEF (EF 15%), HTN, DM2, emphysema, cirrhosis, h/o DVT and LV thrombosis, CKD who presented for generalized weakness, fatigue, decrease appetite and weight loss.  He has severe HFrEF and is SOB at baseline, worse so on admission.  He was recently stopped off of his torsemide due to worsening renal function.  He was noted to have a mild pleural effusion on CXR.  Cr was 2.2 with a BUN of 41, INR 1.4, CBC relatively normal. Abdominal imaging this admission showed a moderate right pleural effusion and resolution of left pleural effusion.  Vitals:   08/23/18 1248 08/23/18 1258  BP: 103/88 114/83  Pulse:  (!) 115  Resp:  18  Temp:  97.7 F (36.5 C)  SpO2:  99%   General: Lying in bed, 10% of elevation and breathing a little fast Eyes: Muddy sclerae, likely normal variation HENT: Temporal wasting, neck supple CV: Tachycardic, regular, distant heart sounds, no edema Pulm: Mild crackles at right base, no wheezing, otherwise clear Abd: Soft, +BS MSK: Thin, decreased muscle tone, thenar wasting  Assessment and Plan: I have seen and evaluated the patient as outlined above. I agree with the formulated Assessment and Plan as detailed in the residents' note, with the following changes:   1. Acute on Chronic HFrEF, EF of 15% - Hold diuretics - Oxygen for comfort - Reviewed last advanced heart failure note from June.  Noted that he may be a candidate for home ionotropes, but this is not certain.  He is not tolerating diuresis.  He has persistent symptoms with renal failure.  Will not be a candidate for ace-I or beta blocker - We discussed the idea of palliative care with him, possibly transitioning to hospice and he was interested at this time -  Depending on this conversation, will plan to get advanced CHF team involved if appropriate.   2. LV thrombus, DVT with subtherapeutic INR - Continue warfarin, trend INR  3. Severe malnutrition - Alb 3 - Muscle wasting, weight loss - Nutrition consult  Other issues per Dr. Redgie Grayer note.   Sid Falcon, MD 7/16/20201:43 PM

## 2018-08-23 NOTE — Progress Notes (Addendum)
Subjective: Patient reports breathing is more difficult this morning. Team offers oxygen for comfort. Sat well on room air. We discussed goal of care going forward and patient is agreable to having Palliative Care Team come by for further discussions.  Objective:  Vital signs in last 24 hours: Vitals:   08/22/18 2025 08/23/18 0030 08/23/18 0455 08/23/18 0458  BP: 119/89 102/81 98/78   Pulse: (!) 111 (!) 111 (!) 107   Resp: 20 20 20    Temp:  97.6 F (36.4 C) (!) 97.4 F (36.3 C)   TempSrc:  Oral Oral   SpO2: 100% 100% 96%   Weight:    53.3 kg   Physical Exam Vitals signs reviewed.  Constitutional:      General: He is not in acute distress.    Appearance: He is not toxic-appearing or diaphoretic.  HENT:     Head: Normocephalic and atraumatic.     Comments: Temporal wasting noted Eyes:     General: No scleral icterus.       Right eye: No discharge.        Left eye: No discharge.     Extraocular Movements: Extraocular movements intact.  Cardiovascular:     Rate and Rhythm: Normal rate and regular rhythm.     Pulses: Normal pulses.     Heart sounds: Normal heart sounds. No murmur. No friction rub. No gallop.   Pulmonary:     Effort: No respiratory distress.     Breath sounds: Normal breath sounds. No wheezing or rales.     Comments: Increased respiratory effort, using intercostal muscles   Abdominal:     General: Abdomen is flat. Bowel sounds are normal. There is no distension.     Palpations: Abdomen is soft.     Tenderness: There is no abdominal tenderness. There is no guarding.  Skin:    General: Skin is warm.     Comments: Dry peeling skin on bilateral forearms  Neurological:     General: No focal deficit present.     Mental Status: He is alert and oriented to person, place, and time.  Psychiatric:        Mood and Affect: Mood normal.        Behavior: Behavior normal.        Thought Content: Thought content normal.        Judgment: Judgment normal.     Assessment/Plan:  Active Problems:   CHF (congestive heart failure) (HCC)  In summary [name] is a 68 year old male with a history of advanced HFrEF (EF 15%), T2DM, cirrhosis secondary to alcohol use, left ventricular thrombus, DVT, and CKD who presented to the ED for increased malaise accompanied by decreased appetite.  At this moment his volume status appears to be euvolemic, and does not appear to be in a decompensated CHF exacerbation despite the highly elevated BNP of 2207.8. The pleural effusion on his lungs is notably better on imaging then his previous admission. Its also unclear the cause of his decreased appetite and severe weight loss over the last few weeks. The differential at this time is broad, but includes CHF exacerbation, NSTEMI, atypical pneumonia, fluid overload secondary to hepatic failure, or malignancy.  Today, his labs are reassuring and do not point to any acute process. This is all likely manifestation of the decline of his health.    #LV Thrombus  #HFrEF (EF 15%): The patient recently stopped taking his torsemide 2 weeks ago at the discretion of his PCP due to  concern over his recovering AKI. The malaise and poor energy could potentially be due to the poor condition of his heart.  He appears to be euvolemic on exam, with no crackles in his lungs or fluid buildup in his abdomen or lower extremities. - Pleural effusion present on CT scan. Increased respiratory effort and SOB on exam today. Will obtain CXR and start supplemental O2. -Daily weights - Warfarin 7.5 mg daily  #CKD #Alcoholic Cirrhosis  #FEN/GI #Failure to thrive: The patient has endorsed a 30 pound weight loss and malaise since his previous admission a few weeks ago. He is also endorsed a poor appetite and has not been eaten or drinking much as of lately. It is possible his kidney disease could be advancing at this point.  No splenomegaly on exam that could explain early satiety. - Nutrition consult,  will appreciate recs - CIWA protocol -Bowel regimen: Senokot tablet  #T2DM: Last A1c is 8.3 as of 08/01/2018. Takes metformin at home. -DC metformin -SSI  #HTN: stable vitals on admission - Hydralazine 12.5 mg twice daily - Imdur 15 mg nightly  #Code Status: DNR #Goals of care: Palliative care consult ordered, palliative team aware.  Dispo: Admit patient to Inpatient with expected length of stay greater than 2 midnights.  Patient says it is okay to call daughter.  Earlene Plater, MD Internal Medicine, PGY1 Pager: [phone]  08/23/2018,11:08 AM

## 2018-08-24 DIAGNOSIS — Z1159 Encounter for screening for other viral diseases: Secondary | ICD-10-CM | POA: Diagnosis not present

## 2018-08-24 DIAGNOSIS — I240 Acute coronary thrombosis not resulting in myocardial infarction: Secondary | ICD-10-CM | POA: Diagnosis not present

## 2018-08-24 DIAGNOSIS — I130 Hypertensive heart and chronic kidney disease with heart failure and stage 1 through stage 4 chronic kidney disease, or unspecified chronic kidney disease: Secondary | ICD-10-CM | POA: Diagnosis present

## 2018-08-24 DIAGNOSIS — Z7901 Long term (current) use of anticoagulants: Secondary | ICD-10-CM | POA: Diagnosis not present

## 2018-08-24 DIAGNOSIS — I5084 End stage heart failure: Secondary | ICD-10-CM | POA: Diagnosis present

## 2018-08-24 DIAGNOSIS — N183 Chronic kidney disease, stage 3 unspecified: Secondary | ICD-10-CM

## 2018-08-24 DIAGNOSIS — Z79899 Other long term (current) drug therapy: Secondary | ICD-10-CM | POA: Diagnosis not present

## 2018-08-24 DIAGNOSIS — Z515 Encounter for palliative care: Secondary | ICD-10-CM | POA: Diagnosis present

## 2018-08-24 DIAGNOSIS — N179 Acute kidney failure, unspecified: Secondary | ICD-10-CM | POA: Diagnosis present

## 2018-08-24 DIAGNOSIS — J90 Pleural effusion, not elsewhere classified: Secondary | ICD-10-CM | POA: Diagnosis present

## 2018-08-24 DIAGNOSIS — E860 Dehydration: Secondary | ICD-10-CM | POA: Diagnosis present

## 2018-08-24 DIAGNOSIS — N189 Chronic kidney disease, unspecified: Secondary | ICD-10-CM | POA: Diagnosis not present

## 2018-08-24 DIAGNOSIS — E1122 Type 2 diabetes mellitus with diabetic chronic kidney disease: Secondary | ICD-10-CM | POA: Diagnosis present

## 2018-08-24 DIAGNOSIS — Z7189 Other specified counseling: Secondary | ICD-10-CM | POA: Diagnosis not present

## 2018-08-24 DIAGNOSIS — R627 Adult failure to thrive: Secondary | ICD-10-CM | POA: Diagnosis present

## 2018-08-24 DIAGNOSIS — K7030 Alcoholic cirrhosis of liver without ascites: Secondary | ICD-10-CM | POA: Diagnosis present

## 2018-08-24 DIAGNOSIS — I5023 Acute on chronic systolic (congestive) heart failure: Secondary | ICD-10-CM | POA: Diagnosis present

## 2018-08-24 DIAGNOSIS — Z66 Do not resuscitate: Secondary | ICD-10-CM | POA: Diagnosis present

## 2018-08-24 DIAGNOSIS — N184 Chronic kidney disease, stage 4 (severe): Secondary | ICD-10-CM | POA: Diagnosis present

## 2018-08-24 DIAGNOSIS — R0602 Shortness of breath: Secondary | ICD-10-CM | POA: Diagnosis not present

## 2018-08-24 DIAGNOSIS — Z8249 Family history of ischemic heart disease and other diseases of the circulatory system: Secondary | ICD-10-CM | POA: Diagnosis not present

## 2018-08-24 DIAGNOSIS — E43 Unspecified severe protein-calorie malnutrition: Secondary | ICD-10-CM | POA: Diagnosis present

## 2018-08-24 DIAGNOSIS — Z87891 Personal history of nicotine dependence: Secondary | ICD-10-CM | POA: Diagnosis not present

## 2018-08-24 DIAGNOSIS — E875 Hyperkalemia: Secondary | ICD-10-CM | POA: Diagnosis present

## 2018-08-24 DIAGNOSIS — Z681 Body mass index (BMI) 19 or less, adult: Secondary | ICD-10-CM | POA: Diagnosis not present

## 2018-08-24 DIAGNOSIS — Z7984 Long term (current) use of oral hypoglycemic drugs: Secondary | ICD-10-CM | POA: Diagnosis not present

## 2018-08-24 LAB — GLUCOSE, CAPILLARY
Glucose-Capillary: 165 mg/dL — ABNORMAL HIGH (ref 70–99)
Glucose-Capillary: 271 mg/dL — ABNORMAL HIGH (ref 70–99)
Glucose-Capillary: 73 mg/dL (ref 70–99)
Glucose-Capillary: 93 mg/dL (ref 70–99)
Glucose-Capillary: 206 mg/dL — ABNORMAL HIGH (ref 70–99)

## 2018-08-24 LAB — BASIC METABOLIC PANEL
Anion gap: 10 (ref 5–15)
BUN: 45 mg/dL — ABNORMAL HIGH (ref 8–23)
CO2: 17 mmol/L — ABNORMAL LOW (ref 22–32)
Calcium: 9.1 mg/dL (ref 8.9–10.3)
Chloride: 107 mmol/L (ref 98–111)
Creatinine, Ser: 2.21 mg/dL — ABNORMAL HIGH (ref 0.61–1.24)
GFR calc Af Amer: 34 mL/min — ABNORMAL LOW (ref >60)
GFR calc non Af Amer: 29 mL/min — ABNORMAL LOW (ref >60)
Glucose, Bld: 220 mg/dL — ABNORMAL HIGH (ref 70–99)
Potassium: 4.7 mmol/L (ref 3.5–5.1)
Sodium: 134 mmol/L — ABNORMAL LOW (ref 135–145)

## 2018-08-24 LAB — PROTIME-INR
INR: 1.8 — ABNORMAL HIGH (ref 0.8–1.2)
Prothrombin Time: 20.5 seconds — ABNORMAL HIGH (ref 11.4–15.2)

## 2018-08-24 MED ORDER — ONDANSETRON HCL 4 MG/2ML IJ SOLN
4.0000 mg | Freq: Once | INTRAMUSCULAR | Status: AC
  Administered 2018-08-24: 4 mg via INTRAVENOUS
  Filled 2018-08-24: qty 2

## 2018-08-24 MED ORDER — WARFARIN SODIUM 5 MG PO TABS
5.0000 mg | ORAL_TABLET | Freq: Once | ORAL | Status: AC
  Administered 2018-08-24: 5 mg via ORAL
  Filled 2018-08-24: qty 1

## 2018-08-24 NOTE — Progress Notes (Signed)
  Date: 08/24/2018  Patient name: Ryan [name]  Medical record number: [id number]  Date of birth: January 15, 1951   I have seen and evaluated this patient and I have discussed the plan of care with the house staff. Please see Dr. Redgie Grayer note for complete details. I concur with his findings and plans.  He seemed more interested in further treatment today.  Will be curious to see what his conversation with the palliative team today.  We may consult advanced heart failure team.     Sid Falcon, MD 08/24/2018, 12:32 PM

## 2018-08-24 NOTE — Consult Note (Signed)
Consultation Note Date: 08/24/2018   Patient Name: Ryan [name]  DOB: Jun 09, 1950  MRN: [id number]  Age / Sex: 68 y.o., male  PCP: Jose Persia, MD Referring Physician: Sid Falcon, MD  Reason for Consultation: Establishing goals of care, Hospice Evaluation and Psychosocial/spiritual support  HPI/Patient Profile: 68 y.o. 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  Patient's BNP on admission was 2207.  Chest x-ray was negative for acute processes.  Patient has a right pleural effusion but this is decreased in size from his admission in June..   Consult ordered for goals of care  Clinical Assessment and Goals of Care: Patient seen, chart reviewed.  Patient is alert and oriented x3 and able to speak for himself.  Introduced palliative medicine services as an additional resource and source of support going forward .  Ryan [name] shares with me his diagnosis of congestive heart failure feels very sudden.  He tells me he only really understood that he had congestive heart failure since his admission in June 2020.  A great deal of our conversation centered on disease education and what to expect going forward with CHF and now associated worsening chronic kidney disease.  Additionally, he asked that I speak with his daughter, which I did.  Ryan [name] is a renal nurse in [place].  She has a very high healthcare literacy and understands her father's current clinical condition.  I discussed with both Mr. Zaldivar and his daughter the role of hospice support in the home.  Both were receptive.  Patient is not full comfort care but once medically maximized, would like to DC home with hospice support in  the home  Patient at this point is able to speak for himself.  Where he unable to, his daughter, Ryan [name] at [phone], would be his healthcare proxy.  Patient has a significant other and verbalizes that he does have support, help in the home    SUMMARY OF RECOMMENDATIONS   Confirmed DNR/DNI Home with hospice once medically maximized Consult placed to care management to help facilitate hospice referral Palliative medicine to stay involved during patient's hospital stay Code Status/Advance Care Planning:  DNR   Palliative Prophylaxis:   Aspiration, Bowel Regimen, Delirium Protocol, Eye Care, Frequent Pain Assessment, Oral Care and Turn Reposition  Additional Recommendations (Limitations, Scope, Preferences):  Avoid Hospitalization, No Artificial Feeding, No Chemotherapy, No Hemodialysis, No Radiation, No Surgical Procedures and No Tracheostomy  Psycho-social/Spiritual:   Desire for further Chaplaincy support:no  Additional Recommendations: Referral to Community Resources   Prognosis:   < 6 months in the setting of advanced heart failure, EF 15%, chronic kidney disease stage IV, creatinine 2.21, failure to thrive (BMI 17, albumin 2.7, weight 114 pounds)  Discharge Planning: Home with Hospice      Primary Diagnoses: Present on Admission: . Acute on chronic systolic CHF (  congestive heart failure) ([place])   I have reviewed the medical record, interviewed the patient and family, and examined the patient. The following aspects are pertinent.  Past Medical History:  Diagnosis Date  . Cardiogenic shock ([place]) 07/19/2018  . History of alcohol use disorder   . Pleural effusion 07/14/2018   Social History   Socioeconomic History  . Marital status: Single    Spouse name: Not on file  . Number of children: Not on file  . Years of education: Not on file  . Highest education level: Not on file  Occupational History  . Not on file  Social Needs  . Financial resource  strain: Not on file  . Food insecurity    Worry: Not on file    Inability: Not on file  . Transportation needs    Medical: Not on file    Non-medical: Not on file  Tobacco Use  . Smoking status: Former Research scientist (life sciences)  . Smokeless tobacco: Never Used  Substance and Sexual Activity  . Alcohol use: Not Currently    Frequency: Never  . Drug use: Never  . Sexual activity: Not on file  Lifestyle  . Physical activity    Days per week: Not on file    Minutes per session: Not on file  . Stress: Not on file  Relationships  . Social Herbalist on phone: Not on file    Gets together: Not on file    Attends religious service: Not on file    Active member of club or organization: Not on file    Attends meetings of clubs or organizations: Not on file    Relationship status: Not on file  Other Topics Concern  . Not on file  Social History Narrative  . Not on file   History reviewed. No pertinent family history. Scheduled Meds: . buPROPion  150 mg Oral QHS  . folic acid  1 mg Oral Daily  . hydrALAZINE  12.5 mg Oral q12n4p  . insulin aspart  0-9 Units Subcutaneous TID WC  . isosorbide mononitrate  15 mg Oral QHS  . mouth rinse  15 mL Mouth Rinse BID  . multivitamin with minerals  1 tablet Oral Daily  . sodium chloride flush  3 mL Intravenous Q12H  . thiamine  100 mg Oral Daily  . warfarin  5 mg Oral ONCE-1800  . Warfarin - Pharmacist Dosing Inpatient   Does not apply q1800   Continuous Infusions: PRN Meds:.acetaminophen **OR** acetaminophen, senna-docusate Medications Prior to Admission:  Prior to Admission medications   Medication Sig Start Date End Date Taking? Authorizing Provider  buPROPion (WELLBUTRIN XL) 150 MG 24 hr tablet Take 1 tablet (150 mg total) by mouth daily. Patient taking differently: Take 150 mg by mouth at bedtime.  08/02/18  Yes [name], MD  hydrALAZINE (APRESOLINE) 25 MG tablet Take 0.5 tablets (12.5 mg total) by mouth 2 times daily at 12 noon and 4 pm.  08/01/18 08/01/19 Yes [name], MD  isosorbide mononitrate (IMDUR) 30 MG 24 hr tablet Take 0.5 tablets (15 mg total) by mouth at bedtime. 08/01/18  Yes [name], MD  metFORMIN (GLUCOPHAGE) 500 MG tablet Take 1 tablet (500 mg total) by mouth 2 (two) times daily with a meal. 08/01/18 08/01/19 Yes [name], MD  Multiple Vitamins-Minerals (ONE-A-DAY PROACTIVE 65+) TABS Take 1 tablet by mouth daily.   Yes [provider]  torsemide (DEMADEX) 20 MG tablet Take 2 tablets (40 mg  total) by mouth daily. 08/02/18  Yes [name], MD  warfarin (COUMADIN) 5 MG tablet Take 1 tablet (5 mg total) by mouth daily at 6 PM. Patient taking differently: Take 5 mg by mouth every evening.  08/01/18  Yes [name], MD  ivabradine (CORLANOR) 5 MG TABS tablet Take 1 tablet (5 mg total) by mouth 2 (two) times daily with a meal. Patient not taking: Reported on 08/22/2018 08/01/18   [name], MD   Allergies  Allergen Reactions  . Ace Inhibitors Cough   Review of Systems  Unable to perform ROS: Acuity of condition    Physical Exam Constitutional:      Comments: Cachectic, older man Weak Short of breath with conversation  HENT:     Head: Normocephalic and atraumatic.     Nose: Nose normal.  Neck:     Musculoskeletal: Normal range of motion.  Cardiovascular:     Rate and Rhythm: Normal rate.  Pulmonary:     Comments: Mild increased work of breathing noted at rest especially with conversation Abdominal:     General: Abdomen is flat.  Musculoskeletal: Normal range of motion.  Skin:    General: Skin is warm and dry.  Neurological:     Mental Status: He is alert and oriented to person, place, and time.  Psychiatric:        Mood and Affect: Mood normal.        Behavior: Behavior normal.        Thought Content: Thought content normal.     Vital Signs: BP 99/78 (BP Location: Left Arm)   Pulse (!) 114   Temp 98.1 F (36.7 C) (Oral)   Resp 20   Ht 5\' 8"  (1.727 m)   Wt 52.2 kg    SpO2 97%   BMI 17.49 kg/m  Pain Scale: 0-10   Pain Score: 0-No pain   SpO2: SpO2: 97 % O2 Device:SpO2: 97 % O2 Flow Rate: .O2 Flow Rate (L/min): 2 L/min  IO: Intake/output summary:   Intake/Output Summary (Last 24 hours) at 08/24/2018 1650 Last data filed at 08/24/2018 1235 Gross per 24 hour  Intake 580 ml  Output 400 ml  Net 180 ml    LBM: Last BM Date: 08/22/18 Baseline Weight: Weight: 53.3 kg Most recent weight: Weight: 52.2 kg     Palliative Assessment/Data:   Flowsheet Rows     Most Recent Value  Intake Tab  Referral Department  Hospitalist  Unit at Time of Referral  Med/Surg Unit  Palliative Care Primary Diagnosis  Cardiac  Date Notified  08/24/18  Reason for referral  Psychosocial or Spiritual support, Clarify Goals of Care, Counsel Regarding Hospice  Date of Admission  08/22/18  Date first seen by Palliative Care  08/24/18  # of days Palliative referral response time  0 Day(s)  # of days IP prior to Palliative referral  2  Clinical Assessment  Palliative Performance Scale Score  40%  Pain Max last 24 hours  Not able to report  Pain Min Last 24 hours  Not able to report  Dyspnea Max Last 24 Hours  Not able to report  Dyspnea Min Last 24 hours  Not able to report  Nausea Max Last 24 Hours  Not able to report  Nausea Min Last 24 Hours  Not able to report  Anxiety Max Last 24 Hours  Not able to report  Anxiety Min Last 24 Hours  Not able to report  Other Max Last  24 Hours  Not able to report  Psychosocial & Spiritual Assessment  Palliative Care Outcomes  Patient/Family meeting held?  Yes  Who was at the meeting?  pt, dtr via phone  Palliative Care Outcomes  Transitioned to hospice, Provided psychosocial or spiritual support, Clarified goals of care  Patient/Family wishes: Interventions discontinued/not started   PEG, Mechanical Ventilation, Hemodialysis  Palliative Care follow-up planned  Yes, Facility      Time In: 1530 Time Out: 1645 Time Total: 75  min Greater than 50%  of this time was spent counseling and coordinating care related to the above assessment and plan.  Signed by: Dory Horn, NP   Please contact Palliative Medicine Team phone at [phone] for questions and concerns.  For individual provider: See Shea Evans

## 2018-08-24 NOTE — Progress Notes (Signed)
Subjective: Patient reports breathing better overnight with O2 supplament. He ask what he has to look forward to and if he will die soon. He is told he likely will die from this condition , however predicting a time is impossible. Goals are to give him nutrition and provide him with care that gives him the best quality time he has left. Palliative care doctors are excellent at having these talks and they are scheduled to visit with him soon. He is in good spirits considering his health condition.  Objective:  Vital signs in last 24 hours: Vitals:   08/23/18 1939 08/23/18 2324 08/23/18 2343 08/24/18 0508  BP: 100/69 (!) 107/91 104/79 114/83  Pulse: (!) 113 (!) 111 (!) 111 (!) 111  Resp: 20 20  20   Temp: (!) 97 F (36.1 C) 97.6 F (36.4 C)  97.9 F (36.6 C)  TempSrc: Oral Oral  Oral  SpO2: 100% 100%  100%  Weight:    52.2 kg   Physical Exam Constitutional:      General: He is not in acute distress.    Appearance: He is not ill-appearing, toxic-appearing or diaphoretic.  HENT:     Head: Normocephalic and atraumatic.     Comments: temporal wasting Eyes:     General: No scleral icterus.       Right eye: No discharge.        Left eye: No discharge.     Extraocular Movements: Extraocular movements intact.  Cardiovascular:     Rate and Rhythm: Regular rhythm. Tachycardia present.     Pulses: Normal pulses.     Heart sounds: No murmur. No friction rub. No gallop.   Pulmonary:     Effort: Pulmonary effort is normal. No respiratory distress.     Breath sounds: Normal breath sounds. No wheezing or rales.     Comments: On O2  Abdominal:     General: Abdomen is flat. Bowel sounds are normal. There is no distension.     Palpations: Abdomen is soft.     Tenderness: There is no abdominal tenderness. There is no guarding.  Musculoskeletal:     Right lower leg: No edema.     Left lower leg: No edema.     Comments: Cachectic  Skin:    General: Skin is warm and dry.   Neurological:     General: No focal deficit present.     Mental Status: He is alert and oriented to person, place, and time. Mental status is at baseline.  Psychiatric:        Mood and Affect: Mood normal.        Behavior: Behavior normal.        Thought Content: Thought content normal.        Judgment: Judgment normal.    Assessment/Plan:  Active Problems:   Pleural effusion   CHF (congestive heart failure) (HCC)   Shortness of breath  In summary Ryan [name] is a 68 year old male with a history ofadvanced HFrEF (EF 15%),T2DM, cirrhosis secondary to alcohol use,left ventricular thrombus,DVT, and CKD who presentedto the ED for increased malaise accompanied by decreased appetite. At this moment his volume status appears to be euvolemic, and does not appear to be in a decompensated CHF exacerbation despite the highly elevated BNP of 2207.8. The pleural effusion on his lungs is notably better on imaging then his previous admission. Its also unclear the cause of his decreased appetite and severe weight loss over the last few weeks. The differential  at this time is broad, but includes CHF exacerbation, NSTEMI, atypical pneumonia, fluid overload secondary to hepatic failure,or malignancy. Today, his labs are reassuring and do not point to any acute process. This is all likely manifestation of the decline of his health.    The patient needs to stay in the hospital for complex medical decision making including consult to the palliative care, and potentially advanced heart failure team. We will change his status from observation to inpatient.  #LV Thrombus  #HFrEF (EF 15%):The patient recently stopped taking his torsemide 2 weeks ago at the discretion of his PCP due to concern over his recovering AKI. The malaise and poor energy could potentially be due to the poor condition of his heart. He appears to be euvolemic on exam, with no crackles in his lungs or fluid buildup in his abdomen or  lower extremities. -Pleural effusionpresenton CT scan. F/u CXR yesterday showed small L sided pleural effusion. On O2 supplement now and subjectively feels much better today. -Palliative care consulted for complex medical decision making. Will appreciate recs. May consult advanced heart failure team during this stay depending on what the goals of care are. -Daily weights -Warfarin 7.5 mg daily, INR   #CKD #Alcoholic Cirrhosis  #FEN/GI #Failure to thrive:The patient has endorsed weight lossand malaisesince his previous admission a few weeks ago. He is also endorsed a poor appetite and has not beeneaten or drinking much as of lately.Cr. Stable around 2.2. No splenomegaly on exam that could explain early satiety. - Nutrition consult, will appreciate recs -CIWAprotocol -Bowel regimen: Senokot tablet  #T2DM:Last A1c is 8.3 as of6/24/2020. Takesmetformin at home. -D/C metformin -SSI  #HTN: stable vitals on admission -Hydralazine 12.5 mg twice daily -Imdur 15 mg nightly  #Code Status: DNR #Goals of care: Palliative care consult ordered, palliative team aware.  Dispo: Admit patient toInpatient with expected length of stay greater than 2 midnights.Patient says it is okay to call daughter.  Earlene Plater, MD Internal Medicine, PGY1 Pager: [phone]  08/24/2018,11:17 AM

## 2018-08-24 NOTE — Progress Notes (Signed)
[facility] for warfarin Indication: LV thrombus/DVT  Allergies  Allergen Reactions  . Ace Inhibitors Cough    Patient Measurements: Height: 5\' 8"  (172.7 cm) Weight: 115 lb (52.2 kg) IBW/kg (Calculated) : 68.4   Vital Signs: Temp: 98.1 F (36.7 C) (07/17 1324) Temp Source: Oral (07/17 1324) BP: 111/84 (07/17 1324) Pulse Rate: 116 (07/17 1324)  Labs: Recent Labs    08/22/18 1354 08/22/18 1630 08/22/18 1905 08/23/18 0619 08/24/18 0319  HGB 15.7  --   --   --   --   HCT 50.4  --   --   --   --   PLT 202  --   --   --   --   LABPROT  --  17.1*  --  19.9* 20.5*  INR  --  1.4*  --  1.7* 1.8*  CREATININE 2.26*  --   --  2.22* 2.21*  TROPONINIHS  --  34* 35*  --   --     Estimated Creatinine Clearance: 23.3 mL/min (A) (by C-G formula based on SCr of 2.21 mg/dL (H)).   Medical History: Past Medical History:  Diagnosis Date  . Cardiogenic shock ([place]) 07/19/2018  . History of alcohol use disorder   . Pleural effusion 07/14/2018    Assessment: Pt is a 68 yr old man presenting w/ c/o no appetite and general maliase. 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 On coumadin PTA 5mg  daily.   INR this AM = 1.8, CBC stable/WNL   Goal of Therapy:  INR 2-3 Monitor platelets by anticoagulation protocol: Yes   Plan:  Coumadin 5 mg x1 this evening (anticipate further INR trend up) INR daily  Gillermina Hu, PharmD, BCPS, [facility] Clinical Pharmacist **Pharmacist phone directory can now be found on [email or web address] ([email or web address]).  Listed under [place].

## 2018-08-24 NOTE — Plan of Care (Signed)
  Problem: Coping: Goal: Level of anxiety will decrease Outcome: Completed/Met   Problem: Elimination: Goal: Will not experience complications related to bowel motility Outcome: Completed/Met Goal: Will not experience complications related to urinary retention Outcome: Completed/Met   Problem: Pain Managment: Goal: General experience of comfort will improve Outcome: Completed/Met   Problem: Skin Integrity: Goal: Risk for impaired skin integrity will decrease Outcome: Completed/Met

## 2018-08-25 LAB — BASIC METABOLIC PANEL
Anion gap: 11 (ref 5–15)
BUN: 48 mg/dL — ABNORMAL HIGH (ref 8–23)
CO2: 18 mmol/L — ABNORMAL LOW (ref 22–32)
Calcium: 9.2 mg/dL (ref 8.9–10.3)
Chloride: 106 mmol/L (ref 98–111)
Creatinine, Ser: 2.47 mg/dL — ABNORMAL HIGH (ref 0.61–1.24)
GFR calc Af Amer: 30 mL/min — ABNORMAL LOW (ref >60)
GFR calc non Af Amer: 26 mL/min — ABNORMAL LOW (ref >60)
Glucose, Bld: 160 mg/dL — ABNORMAL HIGH (ref 70–99)
Potassium: 5.3 mmol/L — ABNORMAL HIGH (ref 3.5–5.1)
Sodium: 135 mmol/L (ref 135–145)

## 2018-08-25 LAB — GLUCOSE, CAPILLARY
Glucose-Capillary: 161 mg/dL — ABNORMAL HIGH (ref 70–99)
Glucose-Capillary: 283 mg/dL — ABNORMAL HIGH (ref 70–99)
Glucose-Capillary: 61 mg/dL — ABNORMAL LOW (ref 70–99)
Glucose-Capillary: 146 mg/dL — ABNORMAL HIGH (ref 70–99)
Glucose-Capillary: 178 mg/dL — ABNORMAL HIGH (ref 70–99)

## 2018-08-25 LAB — PROTIME-INR
INR: 2 — ABNORMAL HIGH (ref 0.8–1.2)
Prothrombin Time: 22.7 seconds — ABNORMAL HIGH (ref 11.4–15.2)

## 2018-08-25 MED ORDER — IPRATROPIUM-ALBUTEROL 0.5-2.5 (3) MG/3ML IN SOLN: 3.0000 mL | Freq: Four times a day (QID) | RESPIRATORY_TRACT | Status: DC | PRN

## 2018-08-25 MED ORDER — DRONABINOL 2.5 MG PO CAPS
5.0000 mg | ORAL_CAPSULE | Freq: Two times a day (BID) | ORAL | Status: DC
  Administered 2018-08-25 – 2018-08-26 (×3): 5 mg via ORAL
  Filled 2018-08-25 (×3): qty 2

## 2018-08-25 MED ORDER — WARFARIN SODIUM 5 MG PO TABS
5.0000 mg | ORAL_TABLET | Freq: Once | ORAL | Status: AC
  Administered 2018-08-25: 5 mg via ORAL
  Filled 2018-08-25: qty 1

## 2018-08-25 MED ORDER — ONDANSETRON HCL 4 MG/2ML IJ SOLN
4.0000 mg | Freq: Four times a day (QID) | INTRAMUSCULAR | Status: DC | PRN
  Administered 2018-08-25: 4 mg via INTRAVENOUS
  Filled 2018-08-25: qty 2

## 2018-08-25 MED ORDER — TORSEMIDE 20 MG PO TABS
20.0000 mg | ORAL_TABLET | Freq: Every day | ORAL | Status: DC
  Administered 2018-08-25: 20 mg via ORAL
  Filled 2018-08-25: qty 1

## 2018-08-25 NOTE — Progress Notes (Signed)
[facility] for warfarin Indication: LV thrombus/DVT  Allergies  Allergen Reactions  . Ace Inhibitors Cough    Patient Measurements: Height: 5\' 8"  (172.7 cm) Weight: 120 lb (54.4 kg) IBW/kg (Calculated) : 68.4   Vital Signs: Temp: 98.2 F (36.8 C) (07/18 0551) Temp Source: Oral (07/18 0551) BP: 104/65 (07/18 0735) Pulse Rate: 108 (07/18 0735)  Labs: Recent Labs    08/22/18 1354  08/22/18 1630 08/22/18 1905 08/23/18 0619 08/24/18 0319 08/25/18 0349  HGB 15.7  --   --   --   --   --   --   HCT 50.4  --   --   --   --   --   --   PLT 202  --   --   --   --   --   --   LABPROT  --    < > 17.1*  --  19.9* 20.5* 22.7*  INR  --    < > 1.4*  --  1.7* 1.8* 2.0*  CREATININE 2.26*  --   --   --  2.22* 2.21* 2.47*  TROPONINIHS  --   --  34* 35*  --   --   --    < > = values in this interval not displayed.    Estimated Creatinine Clearance: 21.7 mL/min (A) (by C-G formula based on SCr of 2.47 mg/dL (H)).   Medical History: Past Medical History:  Diagnosis Date  . Cardiogenic shock ([place]) 07/19/2018  . History of alcohol use disorder   . Pleural effusion 07/14/2018    Assessment: Pt is a 68 yr old man presenting w/ c/o no appetite and general maliase. 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 On coumadin PTA 5mg  daily.   INR this AM = 2, CBC stable/WNL   Goal of Therapy:  INR 2-3 Monitor platelets by anticoagulation protocol: Yes   Plan:  Coumadin 5 mg x1  INR daily  Lorel Monaco, PharmD PGY1 Ambulatory Care Resident [facility] # [phone]

## 2018-08-25 NOTE — Progress Notes (Signed)
  Palliative medicine progress note  Patient seen, chart reviewed.  Patient still appears visibly short of breath at rest and with conversation.  He is not wearing oxygen.  Discussed with patient again possibility of going home with hospice.  He is telling me today he does not know if his significant other, current living arrangements, will work going forward with his declining health.  He asked me  "does hospice have living facilities?",  as well as concerns about assisted living versus independent living.  I spoke to patient's daughter on 08/24/2018 she also had inquired about SNF but was concerned about exposure to COVID-19  Patient is very receptive to anything that would help him maintain his independence including ALF/ SNF as well as hospice.  It is not clear to me whether he would  meet residential hospice requirements but he may meet residential hospice requirements in [facility] which I will attempt to reach out to their liaison today to see if they have beds available for those that perhaps have a prognosis of more weeks, as opposed to less than 2 weeks.  Patient Profile: 68 y.o. 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  Patient's BNP on admission was 2207.  Chest x-ray was negative for acute processes.  Patient has a right pleural effusion but this is decreased in size from his admission in June..   Consult ordered for goals of care.  Patient considering hospice support in the home  Plan Shortness of breath: Needs improvement would recommend starting morphine concentrate at 2.5 mg every 4 hours as needed for acute shortness of breath Also patient may benefit from nebulizer treatments as well as O2  PT evaluation recommended  Patient is 100% service-connected and is  already affiliated with the [place].  I did update care management consult to see if he qualifies for any in-home support through the [place] such as a CAPS worker  I will reach out to hospice and palliative care of [facility] liaison today to see if hospice of [facility] offers more flexibility in terms of residential hospice length of stay  Disposition To be determined.  It is not clear to me today that he wants to return to his current living arrangements.  He is considering other options such as assisted living or skilled nursing facility if he qualifies for that.  Prognosis Patient is quite symptomatic with an EF of only 15%, significant weight loss of 30 pounds in a matter of months, BMI of 17, albumin of 2.7 with comorbidities of diabetes, cirrhosis, chronic kidney disease; multisystem failure  Thank you Romona Curls, NP Total time: 35 minutes Greater than 50% of time was spent in counseling and coordination of care

## 2018-08-25 NOTE — Progress Notes (Signed)
Subjective: Patient seen at bedside this AM. He was resting comfortable in bed when see, but states he does not have much appetite this AM (he was able to drink his ensure). We discussed dronabinol for appetite stimulation and is is open to this. We also discussed working on getting him out of the hospital in the next day or so provided we can coordinate his care with palliative care and hospice. He had no other questions or concerns this AM.  Objective:  Vital signs in last 24 hours: Vitals:   08/25/18 0551 08/25/18 0735 08/25/18 1158 08/25/18 1214  BP: 106/79 104/65 109/85 (!) 149/138  Pulse: (!) 111 (!) 108  (!) 116  Resp: 16   17  Temp: 98.2 F (36.8 C)   97.8 F (36.6 C)  TempSrc: Oral   Oral  SpO2: 100%   100%  Weight: 54.4 kg     Height:       Physical Exam Constitutional:      General: He is not in acute distress.    Appearance: He is ill-appearing.  HENT:     Head:     Comments: temporal wasting Cardiovascular:     Rate and Rhythm: Regular rhythm. Tachycardia present.     Pulses: Normal pulses.  Pulmonary:     Effort: Pulmonary effort is normal. No respiratory distress.     Breath sounds: Normal breath sounds.  Abdominal:     General: Abdomen is flat. Bowel sounds are normal.     Palpations: Abdomen is soft.  Musculoskeletal:     Right lower leg: No edema.     Left lower leg: No edema.     Comments: Cachectic  Skin:    General: Skin is warm and dry.  Neurological:     General: No focal deficit present.     Mental Status: He is alert. Mental status is at baseline.    Assessment/Plan:  Active Problems:   Pleural effusion   CHF (congestive heart failure) (HCC)   Shortness of breath   Acute on chronic systolic CHF (congestive heart failure) (HCC)   Stage 3 chronic kidney disease ([place])  68 year old male with a history ofadvanced HFrEF (EF 15%),T2DM, cirrhosis secondary to alcohol use,left ventricular thrombus,DVT, and CKD who presentedto the ED  for increased malaise accompanied by decreased appetite. At this moment his volume status appears to be euvolemic, and does not appear to be in a decompensated CHF exacerbation despite the highly elevated BNP of 2207.8. The pleural effusion on his lungs is notably better on imaging then his previous admission. Its also unclear the cause of his decreased appetite and severe weight loss over the last few weeks. The differential at this time is broad, but includes CHF exacerbation, NSTEMI, atypical pneumonia, fluid overload secondary to hepatic failure,or malignancy. Today, his labs are reassuring and do not point to any acute process. This is all likely manifestation of the decline of his health.    The patient needs to stay in the hospital for complex medical decision making including on going discussions with palliative care regarding plans for hospice; potentially residential hospice.  #LV Thrombus  #HFrEF (EF 15%):Torsemide held by PCP PTA 2/2 AKI. His malaise is likely, in part, 2/2 low output HF. Appears euvolemic. Smallpleural effusionon imaging, not significant enough to tap. On O2 by [place] PRN. Will restart home diuretic at 1/2 dose. Will add PRN Duonebs for dyspnea. - Appreciate palliative recommendations and assistance - Patient has agreed to hospice and  is interested in residential hospice if available - Hydralazine 12.5mg  BID - IMDUR 15mg  Daily - Torsemide 20mg  -Warfarin 7.5 mg daily, INR  - BMP  #CKD #Alcoholic Cirrhosis  #FEN/GI #Failure to thrive:Weight lossand malaisesince for months including loss of 10lbs since recent admit. Continues to have poor appetite. - Nutrition consult - Dronabinol 5mg  with meals - Bowel regimen: Senokot tablet  #T2DM:Last A1c is 8.3 as of6/24/2020. Takesmetformin at home. - SSI  #Code Status: DNR #Goals of care: Hospice, Searching for ResidentialHospice options  Dispo: Anticipated discharge in 1-3 Days, pending placement options.   Pearson Grippe, DO IM PGY-3

## 2018-08-25 NOTE — Progress Notes (Addendum)
  Date: 08/25/2018  Patient name: Ryan [name]  Medical record number: [id number]  Date of birth: [birth year]   I have seen and evaluated this patient and I have discussed the plan of care with the house staff. Please see Dr. Tally Joe note for complete details. I concur with his findings with the following additions:   Patient is mildly hyperkalemic today, possibly due to worsening renal function. We will restart a low dose diuretic today.  If not improved tomorrow, would check EKG and start K lowering therapy.   Sid Falcon, MD 08/25/2018, 1:56 PM

## 2018-08-26 DIAGNOSIS — E871 Hypo-osmolality and hyponatremia: Secondary | ICD-10-CM

## 2018-08-26 DIAGNOSIS — E875 Hyperkalemia: Secondary | ICD-10-CM

## 2018-08-26 DIAGNOSIS — Z515 Encounter for palliative care: Secondary | ICD-10-CM

## 2018-08-26 DIAGNOSIS — Z888 Allergy status to other drugs, medicaments and biological substances status: Secondary | ICD-10-CM

## 2018-08-26 DIAGNOSIS — Z681 Body mass index (BMI) 19 or less, adult: Secondary | ICD-10-CM

## 2018-08-26 DIAGNOSIS — Z7189 Other specified counseling: Secondary | ICD-10-CM

## 2018-08-26 LAB — BASIC METABOLIC PANEL
Anion gap: 11 (ref 5–15)
BUN: 53 mg/dL — ABNORMAL HIGH (ref 8–23)
CO2: 18 mmol/L — ABNORMAL LOW (ref 22–32)
Calcium: 9.2 mg/dL (ref 8.9–10.3)
Chloride: 103 mmol/L (ref 98–111)
Creatinine, Ser: 2.8 mg/dL — ABNORMAL HIGH (ref 0.61–1.24)
GFR calc Af Amer: 26 mL/min — ABNORMAL LOW (ref >60)
GFR calc non Af Amer: 22 mL/min — ABNORMAL LOW (ref >60)
Glucose, Bld: 204 mg/dL — ABNORMAL HIGH (ref 70–99)
Potassium: 6.2 mmol/L — ABNORMAL HIGH (ref 3.5–5.1)
Sodium: 132 mmol/L — ABNORMAL LOW (ref 135–145)

## 2018-08-26 LAB — PROTIME-INR
INR: 2.1 — ABNORMAL HIGH (ref 0.8–1.2)
Prothrombin Time: 23.2 seconds — ABNORMAL HIGH (ref 11.4–15.2)

## 2018-08-26 LAB — GLUCOSE, CAPILLARY
Glucose-Capillary: 237 mg/dL — ABNORMAL HIGH (ref 70–99)
Glucose-Capillary: 166 mg/dL — ABNORMAL HIGH (ref 70–99)

## 2018-08-26 MED ORDER — MORPHINE SULFATE 10 MG/5ML PO SOLN: 2.5000 mg | ORAL | 0 refills | Status: DC | PRN

## 2018-08-26 MED ORDER — MORPHINE SULFATE 10 MG/5ML PO SOLN: 2.5000 mg | Freq: Four times a day (QID) | ORAL | 0 refills | Status: AC

## 2018-08-26 MED ORDER — ONDANSETRON HCL 4 MG/2ML IJ SOLN: 4.0000 mg | Freq: Four times a day (QID) | INTRAMUSCULAR | 0 refills | Status: AC | PRN

## 2018-08-26 MED ORDER — MORPHINE SULFATE 10 MG/5ML PO SOLN: 2.5000 mg | ORAL | 0 refills | Status: AC

## 2018-08-26 MED ORDER — WARFARIN SODIUM 5 MG PO TABS: 5.0000 mg | ORAL_TABLET | Freq: Once | ORAL | Status: DC

## 2018-08-26 MED ORDER — MORPHINE SULFATE 10 MG/5ML PO SOLN: 2.5000 mg | ORAL | 0 refills | Status: AC | PRN

## 2018-08-26 MED ORDER — TORSEMIDE 20 MG PO TABS: 20.0000 mg | ORAL_TABLET | Freq: Every day | ORAL | 0 refills | Status: AC | PRN

## 2018-08-26 MED ORDER — MORPHINE SULFATE 10 MG/5ML PO SOLN
2.5000 mg | ORAL | Status: AC
  Administered 2018-08-26: 2.5 mg via ORAL
  Filled 2018-08-26: qty 2

## 2018-08-26 MED ORDER — MORPHINE SULFATE 10 MG/5ML PO SOLN: 2.5000 mg | ORAL | Status: DC | PRN

## 2018-08-26 MED ORDER — SENNOSIDES-DOCUSATE SODIUM 8.6-50 MG PO TABS: 1.0000 | ORAL_TABLET | Freq: Every evening | ORAL | Status: AC | PRN

## 2018-08-26 MED ORDER — DRONABINOL 5 MG PO CAPS: 5.0000 mg | ORAL_CAPSULE | Freq: Two times a day (BID) | ORAL | Status: AC

## 2018-08-26 MED ORDER — MORPHINE SULFATE 10 MG/5ML PO SOLN
2.5000 mg | Freq: Four times a day (QID) | ORAL | Status: DC
  Administered 2018-08-26: 2.5 mg via ORAL
  Filled 2018-08-26: qty 2

## 2018-08-26 MED ORDER — IPRATROPIUM-ALBUTEROL 0.5-2.5 (3) MG/3ML IN SOLN: 3.0000 mL | Freq: Four times a day (QID) | RESPIRATORY_TRACT | Status: AC | PRN

## 2018-08-26 NOTE — Progress Notes (Signed)
Report called to Doreen Salvage RN at [facility], Peripheral IV removed, CCMD called, monitor removed and place at [place].  [facility] came to transport pt at 1507/

## 2018-08-26 NOTE — Plan of Care (Signed)
  Problem: Clinical Measurements: Goal: Will remain free from infection Outcome: Completed/Met

## 2018-08-26 NOTE — Progress Notes (Signed)
Subjective: Ryan [name] was seen on rounds this morning at the bedside.  He was sitting up but subsequently laid down due to shortness of breath.  He said that he feels nauseous this morning.  I reminded him that he has PRN Zofran that he can ask his nurse for whenever he feels nauseous.  Frequently pauses between sentences to take breaths.  Increased respiratory effort.  No pain today.  Objective:  Vital signs in last 24 hours: Vitals:   08/25/18 2051 08/25/18 2129 08/25/18 2349 08/26/18 0430  BP: 91/74 108/82 93/73 100/78  Pulse: (!) 110 (!) 118 (!) 121 (!) 109  Resp: 16  18 16   Temp: 98.1 F (36.7 C)  98.4 F (36.9 C) (!) 97.5 F (36.4 C)  TempSrc: Oral  Oral Oral  SpO2: 97%  95% 95%  Weight:    54.9 kg  Height:       Physical Exam Vitals signs reviewed.  Constitutional:      General: He is not in acute distress.    Appearance: He is ill-appearing. He is not toxic-appearing or diaphoretic.  HENT:     Head: Normocephalic and atraumatic.     Comments: Temporal wasting Eyes:     General: No scleral icterus.       Right eye: No discharge.        Left eye: No discharge.     Extraocular Movements: Extraocular movements intact.  Cardiovascular:     Rate and Rhythm: Regular rhythm. Tachycardia present.     Pulses: Normal pulses.     Heart sounds: Normal heart sounds. No murmur. No friction rub. No gallop.   Pulmonary:     Effort: No respiratory distress.     Breath sounds: Normal breath sounds. No wheezing or rales.     Comments: Increased respiratory effort.  Frequently pauses between sentences to take deep breaths  Diminished lung sounds at the right base Abdominal:     General: Abdomen is flat. Bowel sounds are normal. There is no distension.     Palpations: Abdomen is soft.     Tenderness: There is no abdominal tenderness. There is no guarding.  Musculoskeletal:     Right lower leg: No edema.     Left lower leg: No edema.     Comments: Cachectic appearing  Skin:     General: Skin is dry.  Neurological:     General: No focal deficit present.     Mental Status: He is alert and oriented to person, place, and time.  Psychiatric:        Behavior: Behavior normal.        Thought Content: Thought content normal.        Judgment: Judgment normal.     Comments: Flat affect.  Notes that he is feeling down about his prognosis.      Assessment/Plan:  Active Problems:   Pleural effusion   CHF (congestive heart failure) (HCC)   Shortness of breath   Acute on chronic systolic CHF (congestive heart failure) (HCC)   Stage 3 chronic kidney disease ([place])  In summary, Ryan [name] is a 67 year old male with a history ofadvanced HFrEF (EF 15%),T2DM, cirrhosis secondary to alcohol use,left ventricular thrombus,DVT, and CKD who presentedto the ED for increased malaise accompanied by decreased appetite. At this moment his volume status appears to be euvolemic, and does not appear to be in a decompensated CHF exacerbation despite the highly elevated BNP of 2207.8. The pleural effusion on his lungs is notably  better on imaging then his previous admission. Its also unclear the cause of his decreased appetite and severe weight loss over the last few weeks. The initial differential included CHF exacerbation, NSTEMI, atypical pneumonia, fluid overload secondary to hepatic failure,or malignancy. Today, his labs are reassuring and do not point to any acute process. This is all likely manifestation of the decline of his health.  The patient needs to stay in the hospital for complex medical decision making including on going discussions with palliative care regarding plans for hospice; potentially residential hospice.  #LV Thrombus  #HFrEF (EF 15%):Torsemide held by PCP PTA 2/2 AKI. Received 1x dose of torsemide yesterday but will hold today. His malaise is likely, in part, 2/2 low output HF. Appears euvolemic currently. Smallpleural effusionon imaging, not  significant enough to tap. On O2 by [place] PRN.  - Appreciate palliative recommendations:  - Morphine 2.5mg  q4hr PRN for SOB  - Duonebs PRN - Patient has agreed to hospice and is interested in residential hospice if available - Hydralazine 12.5mg  BID - IMDUR 15mg  Daily -Warfarin 7.5 mg daily, INR = 2.1 - BMP  #CKD #Alcoholic Cirrhosis  #FEN/GI #Failure to thrive:Weight lossand malaisesince for months including loss of 10lbs since recent admit. Continues to have poor appetite. Hyponatremic 132 and Hyperkalemic today 6.2 -Nutrition consult  - Dronabinol 5mg  with meals - Bowel regimen: Senokot tablet  #T2DM:Last A1c is 8.3 as of6/24/2020. Takesmetformin at home. - SSI  #Code Status: DNR #Goals of care: Hospice, currently searching for ResidentialHospice options  Dispo: Anticipated discharge in 1-3 Days, pending placement options.  Ryan Plater, MD Internal Medicine, PGY1 Pager: [phone]  08/26/2018,9:32 AM

## 2018-08-26 NOTE — Discharge Summary (Addendum)
Name: Ryan [name] MRN: [id number] DOB: May 03, 1950 68 y.o. PCP: Jose Persia, MD  Date of Admission: 08/22/2018  1:45 PM Date of Discharge: 08/26/2018 Attending Physician: Sid Falcon, MD  Discharge Diagnosis: 1. HFrEF (EF 15%) 2. Failure to thrive/malnutrition  Discharge Medications: Allergies as of 08/26/2018      Reactions   Ace Inhibitors Cough      Medication List    TAKE these medications   buPROPion 150 MG 24 hr tablet Commonly known as: WELLBUTRIN XL Take 1 tablet (150 mg total) by mouth daily. What changed: when to take this   dronabinol 5 MG capsule Commonly known as: MARINOL Take 1 capsule (5 mg total) by mouth 2 (two) times daily before lunch and supper.   hydrALAZINE 25 MG tablet Commonly known as: APRESOLINE Take 0.5 tablets (12.5 mg total) by mouth 2 times daily at 12 noon and 4 pm.   ipratropium-albuterol 0.5-2.5 (3) MG/3ML Soln Commonly known as: DUONEB Take 3 mLs by nebulization every 6 (six) hours as needed.   isosorbide mononitrate 30 MG 24 hr tablet Commonly known as: IMDUR Take 0.5 tablets (15 mg total) by mouth at bedtime.   ivabradine 5 MG Tabs tablet Commonly known as: CORLANOR Take 1 tablet (5 mg total) by mouth 2 (two) times daily with a meal.   metFORMIN 500 MG tablet Commonly known as: Glucophage Take 1 tablet (500 mg total) by mouth 2 (two) times daily with a meal.   morphine 10 MG/5ML solution Take 1.3 mLs (2.6 mg total) by mouth now for 1 dose.   morphine 10 MG/5ML solution Take 1.3 mLs (2.6 mg total) by mouth every 6 (six) hours.   morphine 10 MG/5ML solution Take 1.3-2.5 mLs (2.6-5 mg total) by mouth every 2 (two) hours as needed (dyspnea).   ondansetron 4 MG/2ML Soln injection Commonly known as: ZOFRAN Inject 2 mLs (4 mg total) into the vein every 6 (six) hours as needed for nausea or vomiting.   One-A-Day Proactive 65+ Tabs Take 1 tablet by mouth daily.   senna-docusate 8.6-50 MG tablet Commonly known  as: Senokot-S Take 1 tablet by mouth at bedtime as needed for mild constipation.   torsemide 20 MG tablet Commonly known as: DEMADEX Take 1 tablet (20 mg total) by mouth daily as needed (Edema, Volume overload). What changed:   how much to take  when to take this  reasons to take this   warfarin 5 MG tablet Commonly known as: COUMADIN Take 1 tablet (5 mg total) by mouth daily at 6 PM. What changed: when to take this      Disposition and follow-up:   [name] was discharged from [facility] to hospice for comfort care.  At the hospital follow up visit please address:  1.  Symptom management per hospice provider.   2.  Labs / imaging needed at time of follow-up: none  3.  Pending labs/ test needing follow-up: none  Follow-up Appointments:   Please follow up with hospice provider.  Hospital Course by problem list: 1. HFrEF (EF 15%): Patient initially presented to the ED with increased malaise and decreased appetite.  He appeared to be euvolemic and did not appear to be in decompensated CHF despite an elevated BNP of 2207.8.  He did have a small pleural effusion on his lungs that was notably better on imaging then his previous admission of few weeks prior. We held his home torsemide because he did not appear to be volume overloaded. We  consulted palliative care given his advanced stage of heart failure. It was decided that he would focus on comfort care and was subsequently discharged to a hospice facility.  - Morphine 2.5mg  q4hr PRN for SOB - duonebs PRN   2. Protein calorie malnutrition: The patient noted 10 pounds of weight loss since previous admission a few weeks ago.  He has had a poor appetite.  He noted that he could feel hungry but would be full after a couple bites.  During his stay he had waves of nausea and occasional vommiting which was treated with as needed Zofran. - Nutrition consult resulted in Dronabinol 5 mg with meals.   Discharge  Vitals:   BP 100/78 (BP Location: Left Arm)   Pulse (!) 109   Temp (!) 97.5 F (36.4 C) (Oral)   Resp 16   Ht 5\' 8"  (1.727 m)   Wt 54.9 kg   SpO2 95%   BMI 18.40 kg/m   Pertinent Labs, Studies, and Procedures:  CBC Latest Ref Rng & Units 08/22/2018 08/08/2018 07/31/2018  WBC 4.0 - 10.5 K/uL 4.8 6.3 8.9  Hemoglobin 13.0 - 17.0 g/dL 15.7 16.3 13.9  Hematocrit 39.0 - 52.0 % 50.4 50.3 41.2  Platelets 150 - 400 K/uL 202 209 201   BMP Latest Ref Rng & Units 08/26/2018 08/25/2018 08/24/2018  Glucose 70 - 99 mg/dL 204(H) 160(H) 220(H)  BUN 8 - 23 mg/dL 53(H) 48(H) 45(H)  Creatinine 0.61 - 1.24 mg/dL 2.80(H) 2.47(H) 2.21(H)  Sodium 135 - 145 mmol/L 132(L) 135 134(L)  Potassium 3.5 - 5.1 mmol/L 6.2(H) 5.3(H) 4.7  Chloride 98 - 111 mmol/L 103 106 107  CO2 22 - 32 mmol/L 18(L) 18(L) 17(L)  Calcium 8.9 - 10.3 mg/dL 9.2 9.2 9.1    CXR: FINDINGS: Stable cardiomegaly. Atherosclerosis of thoracic aorta is noted. No pneumothorax or pleural effusion is noted. Both lungs are clear. The visualized skeletal structures are unremarkable.  IMPRESSION: No active disease.  Discharge Instructions: Discharge Instructions    Discharge instructions   Complete by: As directed    Thank you for allowing [place] to care for you and thank you for his services  Please work with your hospice providers to help you remain comfortable    Thank you for allowing [place] to take care of you during your hospitalization.  Below is a summary of what we treated:  1.  Heart failure - You have been experiencing increased shortness of breath and discomfort.  The hospice providers will focus their treatments on making you feel as comfortable as possible moving forward.  2.  Decreased appetite & malnutrition - Their lack of appetite is likely due to your underlying chronic kidney disease and heart failure making you feel poorly.  The hospice provider will address this as well.   If you have any questions or concerns feel free to  call or message [place].  Signed: Earlene Plater, MD Internal Medicine, PGY1 Pager: [phone]  08/26/2018,11:05 AM

## 2018-08-26 NOTE — Progress Notes (Addendum)
Ryan [name] for warfarin Indication: LV thrombus/DVT  Allergies  Allergen Reactions  . Ace Inhibitors Cough    Patient Measurements: Height: 5\' 8"  (172.7 cm) Weight: 121 lb (54.9 kg) IBW/kg (Calculated) : 68.4   Vital Signs: Temp: 97.5 F (36.4 C) (07/19 0430) Temp Source: Oral (07/19 0430) BP: 100/78 (07/19 0430) Pulse Rate: 109 (07/19 0430)  Labs: Recent Labs    08/24/18 0319 08/25/18 0349 08/26/18 0631  LABPROT 20.5* 22.7* 23.2*  INR 1.8* 2.0* 2.1*  CREATININE 2.21* 2.47* 2.80*    Estimated Creatinine Clearance: 19.3 mL/min (A) (by C-G formula based on SCr of 2.8 mg/dL (H)).   Medical History: Past Medical History:  Diagnosis Date  . Cardiogenic shock ([place]) 07/19/2018  . History of alcohol use disorder   . Pleural effusion 07/14/2018    Assessment: Pt is a 68 yr old man presenting w/ c/o no appetite and general maliase. 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 On coumadin PTA 5mg  daily.   INR this AM = 2.1, CBC stable/WNL   Goal of Therapy:  INR 2-3 Monitor platelets by anticoagulation protocol: Yes   Plan:  Coumadin 5 mg x1  INR daily  Lorel Monaco, PharmD PGY1 Ambulatory Care Resident [employer] # [phone]  I discussed / reviewed the pharmacy note by Dr. Sudie Bailey and I agree with the resident's findings and plans as documented.  Thank you Anette Guarneri, PharmD

## 2018-08-26 NOTE — Progress Notes (Signed)
[employer] available today for [name]. Paper work completed with his daughter Marliss Czar a few minutes ago. Discharge summary has been sent to [facility].   RN please call report to [phone] prior to patient leaving the unit.   Thank you,  Erling Conte, LCSW [phone]

## 2018-08-26 NOTE — Progress Notes (Signed)
  Date: 08/26/2018  Patient name: Ryan [name]  Medical record number: [id number]  Date of birth: [birth year]   I have seen and evaluated this patient and I have discussed the plan of care with the house staff. Please see Dr. Redgie Grayer note for complete details. I concur with his findings and plan.  Patient will discharge to residential hospice today for end stage CHF.   Sid Falcon, MD 08/26/2018, 12:10 PM

## 2018-08-26 NOTE — Progress Notes (Signed)
  Palliative medicine progress note  Patient seen, chart reviewed.  Patient was started on oxygen as well as low-dose morphine for shortness of breath.  Per review of MAR, no PRN medicine given to patient.  Oxygen was started.  Patient however continues to decline.  His creatinine is worsening, today is now 2.8 as well as with associated hyperkalemia, potassium now 6.2, and increased INR at 2.1  [name] is struggling with what is likely now end-of-life.  He is complaining of chest heaviness, indigestion as well as ongoing loss of appetite.  He has given me permission to speak to his daughter who resides in [place].  I did not know that he had a son until today, who lives in [place].  Per his daughter, both of them are coming to [facility] to see patient.  His daughter is in agreement with taking residential hospice bed.  Additionally, I spoke with primary attending team and they have been back to assess [name] as well as speak to him again regarding his goals.  Per attending, patient shared with them that he knows he is in dying and also wishes to "feel better" for the time he has.  He reiterated that same goal to me. Marland Kitchen  [facility]osp[facility] and palliative care of [facility] representative states they have approved his admission and he has a bed today.  I have notified patient's daughter of this and she is in agreement with that plan for transfer.  She also is recognizes as a renal nurse herself that his hyperkalemia is coming from his associated renal failure and states not to treat with Kayexalate.  Patient is incredibly weak this morning visibly short of breath at rest with oxygen on and complaining of chest heaviness and indigestion  Plan Do not treat hyperkalemia with Kayexalate Patient is now comfort care Transfer to residential hospice today.  Patient will need a discharge summary but no prescriptions Consult placed to social work as well as I have spoken directly to hospice and palliative  care of [facility] liaison to confirm transfer and bed availability We will start scheduled morphine to assist in comfort prior to transfer  Disposition [facility]; hospice and palliative care of [facility] has bed availability today  Prognosis Less than 2 weeks in the setting of end-stage heart failure with now associated cardiorenal syndrome, hyperkalemia (potassium 6.2), creatinine 2.8.  Patient is very symptomatic in terms of chest heaviness indigestion as well as shortness of breath at rest  Thank you Romona Curls, NP Total time: 40 minutes Greater than 50% of time was spent in counseling and coordination of care

## 2018-08-26 NOTE — TOC Transition Note (Addendum)
Transition of Care [facility]) - CM/SW Discharge Note   Patient Details  Name: Ryan [name] MRN: [id number] Date of Birth: [birth year]  [facility] CM/SW Contact:  Bary Castilla, LCSW Phone Number: [phone] 08/26/2018, 11:53 AM   Clinical Narrative:     11:50 am-CSW received call from EVA from hospice that patient will be going to [facility] place today and will need transportation arranged.When notified CSW will arrange transportation.  CSW will continue to follow for discharge planning.   12:55pm- was notified that patient is ready for transport to [facility] and arranged transportation. Harmon Pier from hospice contacted daughter and wrote not for call to report number.        Patient Goals and CMS Choice        Discharge Placement                       Discharge Plan and Services                                     Social Determinants of Health (SDOH) Interventions     Readmission Risk Interventions No flowsheet data found.

## 2018-10-09 DEATH — deceased

## 2019-05-14 LAB — ALDOSTERONE + RENIN ACTIVITY W/ RATIO
ALDO / PRA Ratio: 2.8 (ref 0.0–30.0)
Aldosterone: 6.3 ng/dL (ref 0.0–30.0)
PRA LC/MS/MS: 2.264 ng/mL/hr (ref 0.167–5.380)

## 2021-05-12 IMAGING — DX PORTABLE CHEST - 1 VIEW
1 series · 1 of 1 positions shown · non-contrast
Comparison: Single-view of the chest 07/16/2018 and 07/13/2018. CT
chest 07/15/2018.

CLINICAL DATA: Shortness of breath and dry cough for 3 weeks.
Patient status post right thoracentesis 07/16/2018.

EXAM:
PORTABLE CHEST 1 VIEW

[chest ap]
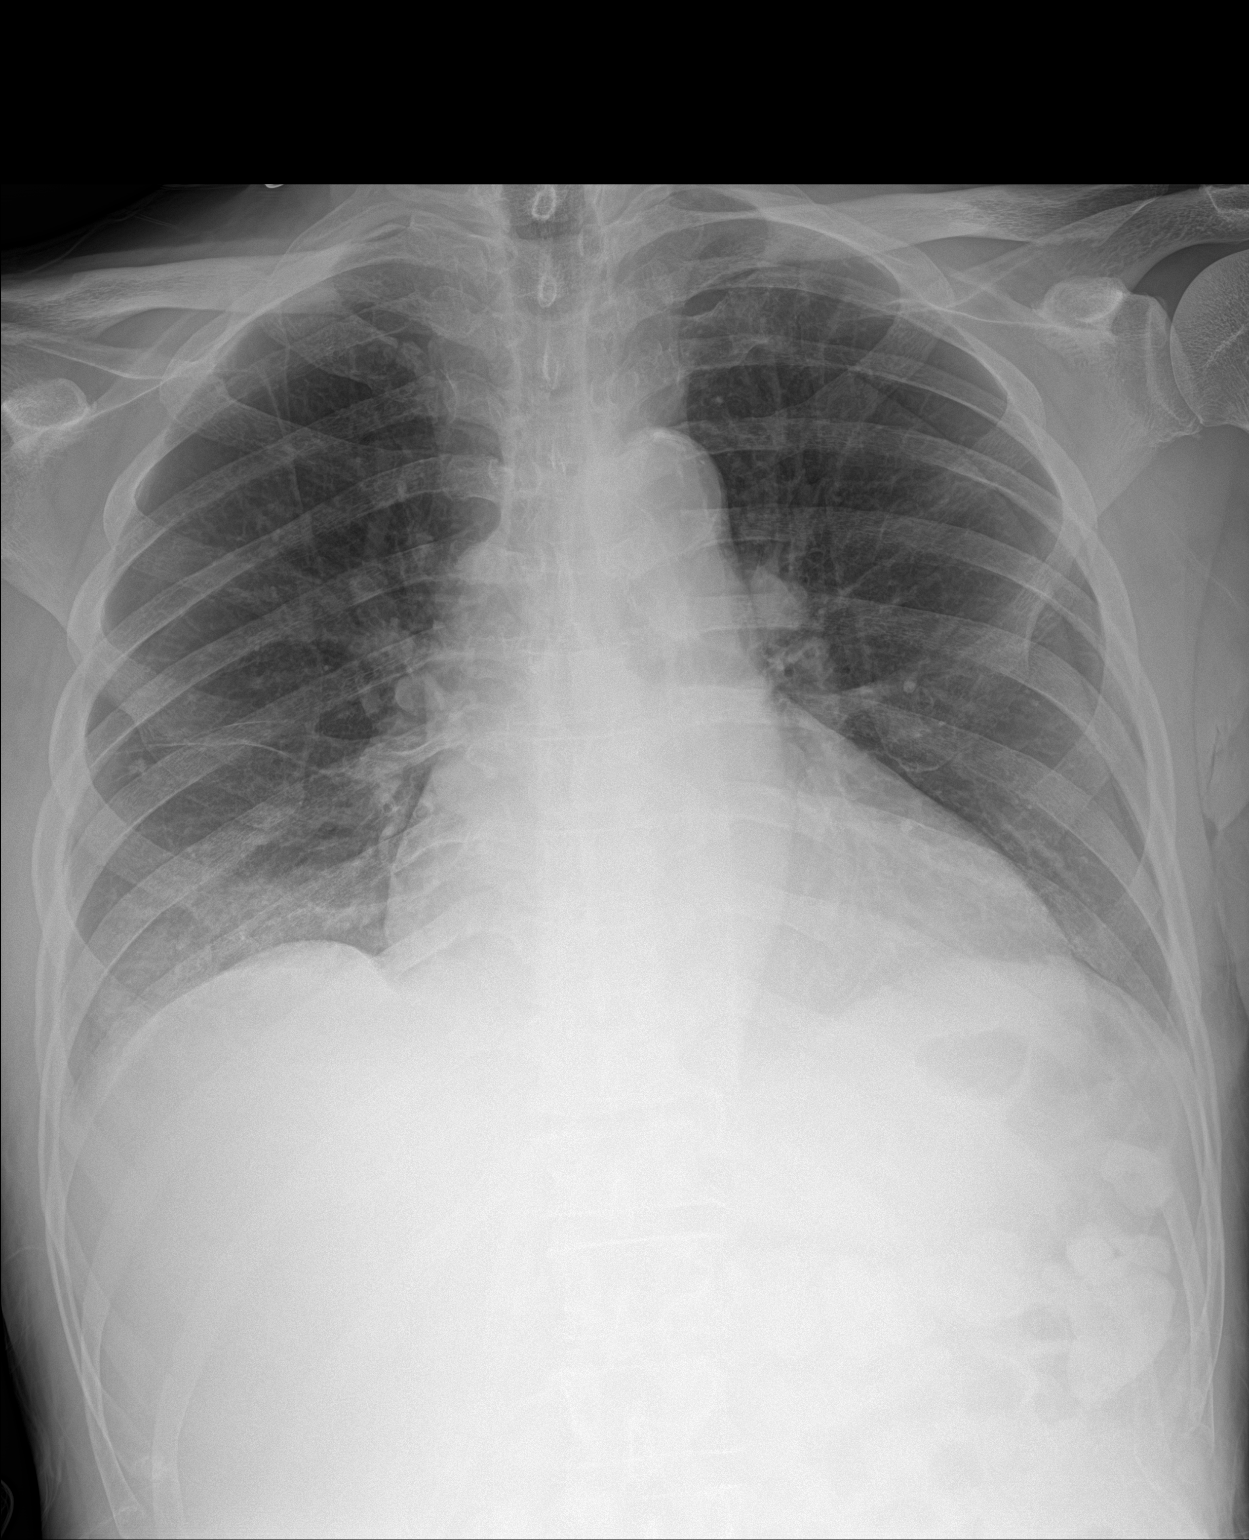

[1 of 1 positions shown; findings below may reference images not displayed]

FINDINGS: Hazy opacities over the lung bases are compatible with layering
pleural effusions, greater on the right. Right effusion has likely
increased since the most recent examination. Calcified granuloma
projecting in the right lower lung zone is unchanged. There is
cardiomegaly. Aortic atherosclerosis is seen.
IMPRESSION: Some increase in hazy opacity over the lower right chest since the
most recent examination compatible with increased pleural effusion
and basilar airspace disease. Smaller left effusion and basilar
airspace disease are unchanged.

Cardiomegaly.

Atherosclerosis.

## 2021-06-16 IMAGING — DX PORTABLE CHEST - 1 VIEW
1 series · 1 of 1 positions shown · non-contrast
Comparison: July 30, 2018

CLINICAL DATA: Weight loss and weakness

EXAM:
PORTABLE CHEST 1 VIEW

[chest]
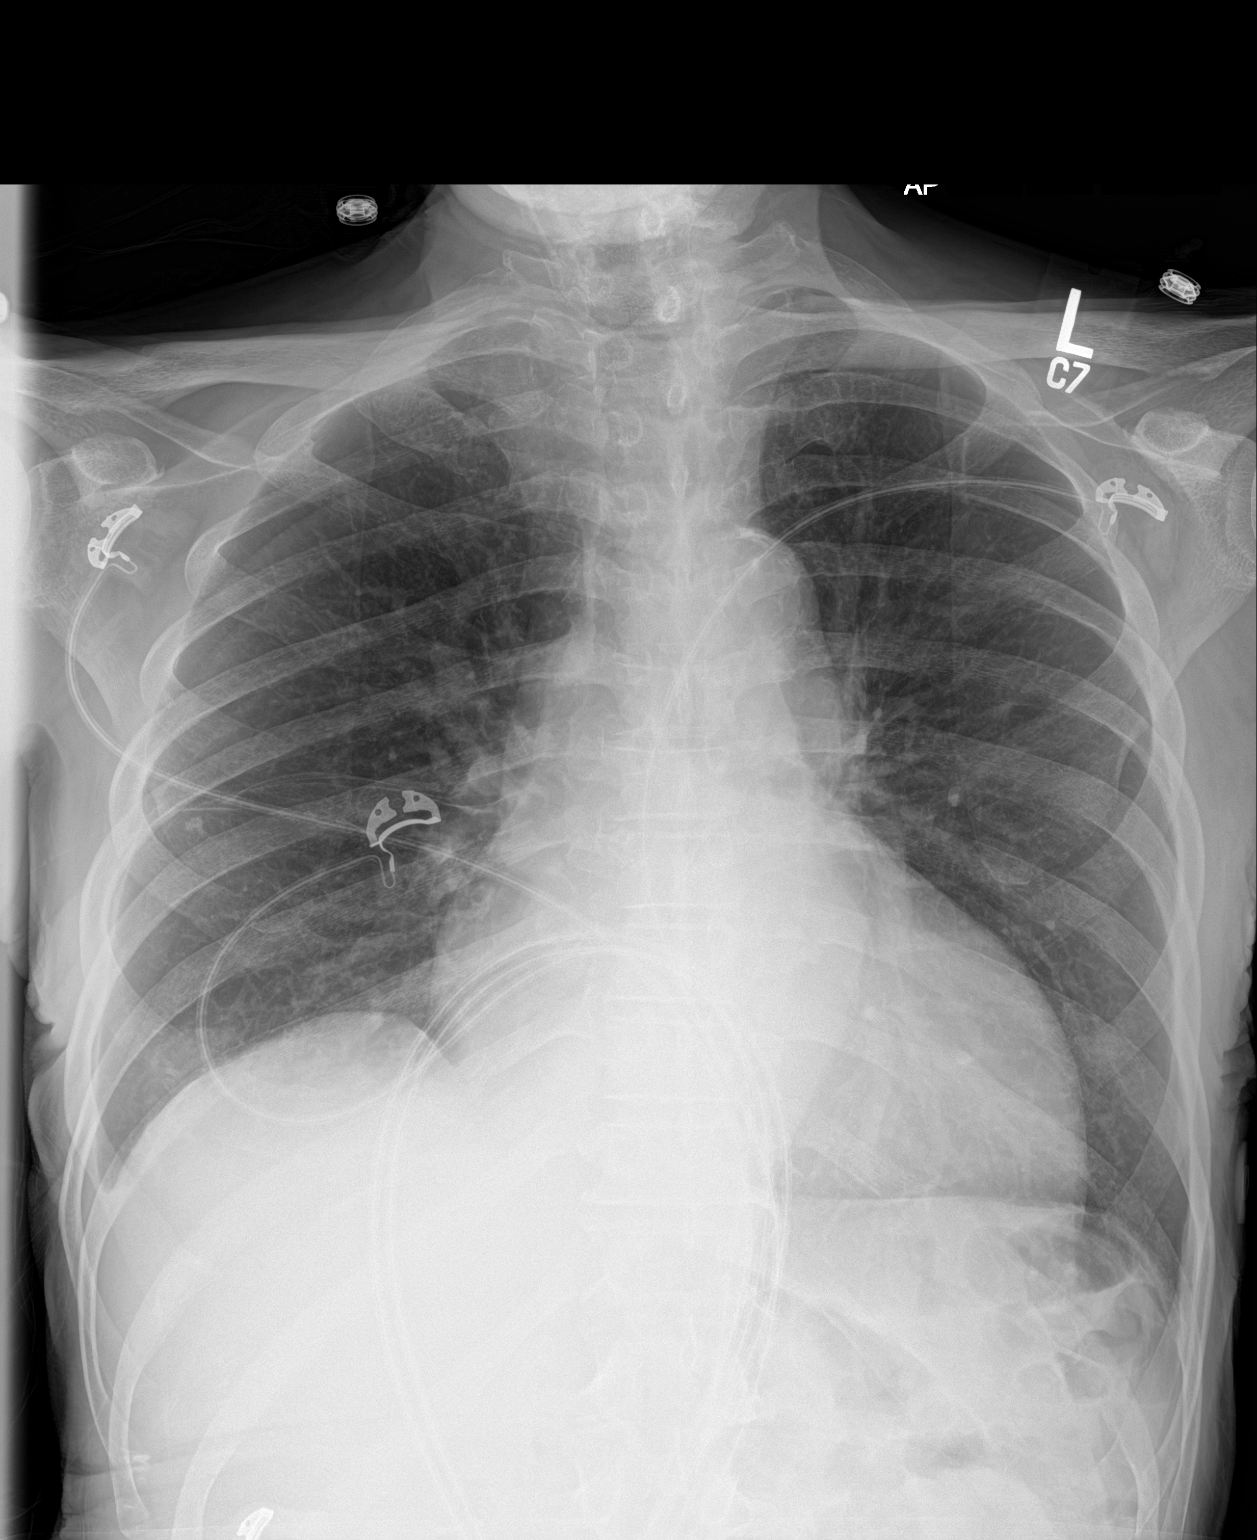

[1 of 1 positions shown; findings below may reference images not displayed]

FINDINGS: There is a calcified granuloma in the right mid lung. There is no
edema or consolidation. There is cardiomegaly with pulmonary
vascularity normal. No adenopathy. There is aortic atherosclerosis.
No bone lesions.
IMPRESSION: Calcified granuloma right mid lung. No edema or consolidation.
Stable cardiomegaly. Aortic Atherosclerosis ([id number].V).
